# Patient Record
Sex: Male | Born: 1955 | Race: White | Hispanic: No | Marital: Single | Smoking: Former smoker
Health system: Southern US, Community
[De-identification: ages and names within clinical notes are randomized; demographics above are authoritative.]

## PROBLEM LIST (undated history)

## (undated) DIAGNOSIS — I739 Peripheral vascular disease, unspecified: Secondary | ICD-10-CM

## (undated) DIAGNOSIS — R0600 Dyspnea, unspecified: Secondary | ICD-10-CM

## (undated) DIAGNOSIS — Z972 Presence of dental prosthetic device (complete) (partial): Secondary | ICD-10-CM

## (undated) DIAGNOSIS — J449 Chronic obstructive pulmonary disease, unspecified: Secondary | ICD-10-CM

## (undated) DIAGNOSIS — I6529 Occlusion and stenosis of unspecified carotid artery: Secondary | ICD-10-CM

## (undated) DIAGNOSIS — E785 Hyperlipidemia, unspecified: Secondary | ICD-10-CM

## (undated) DIAGNOSIS — F17200 Nicotine dependence, unspecified, uncomplicated: Secondary | ICD-10-CM

## (undated) DIAGNOSIS — K08109 Complete loss of teeth, unspecified cause, unspecified class: Secondary | ICD-10-CM

## (undated) DIAGNOSIS — I209 Angina pectoris, unspecified: Secondary | ICD-10-CM

## (undated) DIAGNOSIS — N529 Male erectile dysfunction, unspecified: Secondary | ICD-10-CM

## (undated) DIAGNOSIS — K219 Gastro-esophageal reflux disease without esophagitis: Secondary | ICD-10-CM

## (undated) DIAGNOSIS — I10 Essential (primary) hypertension: Secondary | ICD-10-CM

## (undated) DIAGNOSIS — I2510 Atherosclerotic heart disease of native coronary artery without angina pectoris: Secondary | ICD-10-CM

## (undated) HISTORY — PX: ELBOW SURGERY: SHX618

## (undated) HISTORY — DX: Gastro-esophageal reflux disease without esophagitis: K21.9

## (undated) HISTORY — DX: Male erectile dysfunction, unspecified: N52.9

## (undated) HISTORY — DX: Occlusion and stenosis of unspecified carotid artery: I65.29

## (undated) HISTORY — DX: Essential (primary) hypertension: I10

## (undated) HISTORY — DX: Chronic obstructive pulmonary disease, unspecified: J44.9

## (undated) HISTORY — DX: Complete loss of teeth, unspecified cause, unspecified class: K08.109

## (undated) HISTORY — DX: Nicotine dependence, unspecified, uncomplicated: F17.200

## (undated) HISTORY — DX: Hyperlipidemia, unspecified: E78.5

## (undated) HISTORY — DX: Presence of dental prosthetic device (complete) (partial): Z97.2

---

## 1999-03-31 ENCOUNTER — Emergency Department (HOSPITAL_COMMUNITY): Admission: EM | Admit: 1999-03-31 | Discharge: 1999-03-31 | Payer: Self-pay | Admitting: Emergency Medicine

## 2002-09-20 ENCOUNTER — Encounter: Payer: Self-pay | Admitting: Family Medicine

## 2002-09-20 ENCOUNTER — Encounter: Admission: RE | Admit: 2002-09-20 | Discharge: 2002-09-20 | Payer: Self-pay | Admitting: Family Medicine

## 2005-10-28 HISTORY — PX: COLONOSCOPY: SHX174

## 2007-03-05 LAB — HM COLONOSCOPY: HM Colonoscopy: NORMAL

## 2007-11-19 ENCOUNTER — Encounter: Admission: RE | Admit: 2007-11-19 | Discharge: 2007-11-19 | Payer: Self-pay | Admitting: Family Medicine

## 2010-10-03 ENCOUNTER — Encounter
Admission: RE | Admit: 2010-10-03 | Discharge: 2010-10-03 | Payer: Self-pay | Source: Home / Self Care | Admitting: Cardiovascular Disease

## 2010-10-09 ENCOUNTER — Ambulatory Visit (HOSPITAL_COMMUNITY)
Admission: RE | Admit: 2010-10-09 | Discharge: 2010-10-09 | Payer: Self-pay | Source: Home / Self Care | Attending: Cardiovascular Disease | Admitting: Cardiovascular Disease

## 2010-10-24 ENCOUNTER — Ambulatory Visit: Payer: Self-pay | Admitting: Surgery

## 2010-10-28 HISTORY — PX: OTHER SURGICAL HISTORY: SHX169

## 2010-11-01 ENCOUNTER — Inpatient Hospital Stay (HOSPITAL_COMMUNITY)
Admission: RE | Admit: 2010-11-01 | Discharge: 2010-11-02 | Payer: Self-pay | Source: Home / Self Care | Attending: Surgery | Admitting: Surgery

## 2010-11-01 DIAGNOSIS — I771 Stricture of artery: Secondary | ICD-10-CM

## 2010-11-01 HISTORY — DX: Stricture of artery: I77.1

## 2010-11-02 LAB — CBC
HCT: 42.1 % (ref 39.0–52.0)
Hemoglobin: 14.4 g/dL (ref 13.0–17.0)
MCH: 31.6 pg (ref 26.0–34.0)
MCHC: 34.2 g/dL (ref 30.0–36.0)
MCV: 92.5 fL (ref 78.0–100.0)
Platelets: 188 10*3/uL (ref 150–400)
RBC: 4.55 MIL/uL (ref 4.22–5.81)
RDW: 13.6 % (ref 11.5–15.5)
WBC: 13.7 10*3/uL — ABNORMAL HIGH (ref 4.0–10.5)

## 2010-11-02 LAB — BASIC METABOLIC PANEL
BUN: 6 mg/dL (ref 6–23)
CO2: 23 mEq/L (ref 19–32)
Calcium: 8.6 mg/dL (ref 8.4–10.5)
Chloride: 100 mEq/L (ref 96–112)
Creatinine, Ser: 0.79 mg/dL (ref 0.4–1.5)
GFR calc Af Amer: >60 mL/min (ref >60)
GFR calc non Af Amer: >60 mL/min (ref >60)
Glucose, Bld: 147 mg/dL — ABNORMAL HIGH (ref 70–99)
Potassium: 3.6 mEq/L (ref 3.5–5.1)
Sodium: 131 mEq/L — ABNORMAL LOW (ref 135–145)

## 2010-11-12 ENCOUNTER — Ambulatory Visit
Admission: RE | Admit: 2010-11-12 | Discharge: 2010-11-12 | Payer: Self-pay | Source: Home / Self Care | Attending: Surgery | Admitting: Surgery

## 2011-01-07 LAB — COMPREHENSIVE METABOLIC PANEL
ALT: 25 U/L (ref 0–53)
AST: 22 U/L (ref 0–37)
Albumin: 3.9 g/dL (ref 3.5–5.2)
Alkaline Phosphatase: 74 U/L (ref 39–117)
BUN: 11 mg/dL (ref 6–23)
CO2: 29 mEq/L (ref 19–32)
Calcium: 10.2 mg/dL (ref 8.4–10.5)
Chloride: 103 mEq/L (ref 96–112)
Creatinine, Ser: 0.9 mg/dL (ref 0.4–1.5)
GFR calc Af Amer: >60 mL/min (ref >60)
GFR calc non Af Amer: >60 mL/min (ref >60)
Glucose, Bld: 105 mg/dL — ABNORMAL HIGH (ref 70–99)
Potassium: 4.7 mEq/L (ref 3.5–5.1)
Sodium: 139 mEq/L (ref 135–145)
Total Bilirubin: 0.5 mg/dL (ref 0.3–1.2)
Total Protein: 6.8 g/dL (ref 6.0–8.3)

## 2011-01-07 LAB — CBC
HCT: 46 % (ref 39.0–52.0)
Hemoglobin: 15.8 g/dL (ref 13.0–17.0)
MCH: 31.7 pg (ref 26.0–34.0)
MCHC: 34.3 g/dL (ref 30.0–36.0)
MCV: 92.4 fL (ref 78.0–100.0)
Platelets: 221 10*3/uL (ref 150–400)
RBC: 4.98 MIL/uL (ref 4.22–5.81)
RDW: 13.4 % (ref 11.5–15.5)
WBC: 9.8 10*3/uL (ref 4.0–10.5)

## 2011-01-07 LAB — URINALYSIS, ROUTINE W REFLEX MICROSCOPIC
Bilirubin Urine: NEGATIVE
Glucose, UA: NEGATIVE mg/dL
Hgb urine dipstick: NEGATIVE
Ketones, ur: NEGATIVE mg/dL
Nitrite: NEGATIVE
Protein, ur: NEGATIVE mg/dL
Specific Gravity, Urine: 1.009 (ref 1.005–1.030)
Urobilinogen, UA: 0.2 mg/dL (ref 0.0–1.0)
pH: 5 (ref 5.0–8.0)

## 2011-01-07 LAB — PROTIME-INR
INR: 0.94 (ref 0.00–1.49)
Prothrombin Time: 12.8 seconds (ref 11.6–15.2)

## 2011-01-07 LAB — TYPE AND SCREEN
ABO/RH(D): A POS
Antibody Screen: NEGATIVE

## 2011-01-07 LAB — ABO/RH: ABO/RH(D): A POS

## 2011-01-07 LAB — SURGICAL PCR SCREEN
MRSA, PCR: NEGATIVE
Staphylococcus aureus: NEGATIVE

## 2011-01-07 LAB — APTT: aPTT: 28 seconds (ref 24–37)

## 2011-01-08 LAB — POTASSIUM: Potassium: 4 mEq/L (ref 3.5–5.1)

## 2011-03-05 ENCOUNTER — Ambulatory Visit (INDEPENDENT_AMBULATORY_CARE_PROVIDER_SITE_OTHER): Payer: 59 | Admitting: Medical

## 2011-03-05 ENCOUNTER — Encounter: Payer: Self-pay | Admitting: Medical

## 2011-03-05 VITALS — BP 140/92 | HR 60 | Ht 69.0 in | Wt 149.0 lb

## 2011-03-05 DIAGNOSIS — H02849 Edema of unspecified eye, unspecified eyelid: Secondary | ICD-10-CM

## 2011-03-05 DIAGNOSIS — Z23 Encounter for immunization: Secondary | ICD-10-CM

## 2011-03-05 DIAGNOSIS — I779 Disorder of arteries and arterioles, unspecified: Secondary | ICD-10-CM

## 2011-03-05 DIAGNOSIS — H02829 Cysts of unspecified eye, unspecified eyelid: Secondary | ICD-10-CM

## 2011-03-05 DIAGNOSIS — Z0000 Encounter for general adult medical examination without abnormal findings: Secondary | ICD-10-CM

## 2011-03-05 DIAGNOSIS — F17200 Nicotine dependence, unspecified, uncomplicated: Secondary | ICD-10-CM

## 2011-03-05 DIAGNOSIS — E785 Hyperlipidemia, unspecified: Secondary | ICD-10-CM | POA: Insufficient documentation

## 2011-03-05 LAB — POCT URINALYSIS DIPSTICK
Bilirubin, UA: NEGATIVE
Blood, UA: NEGATIVE
Glucose, UA: NEGATIVE
Ketones, UA: NEGATIVE
Leukocytes, UA: NEGATIVE
Nitrite, UA: NEGATIVE
Protein, UA: NEGATIVE
Spec Grav, UA: 1.005
Urobilinogen, UA: NEGATIVE
pH, UA: 6

## 2011-03-05 NOTE — Progress Notes (Signed)
Subjective:    Patient ID: Manuel Richardson, male    DOB: 07-Jan-1956, 55 y.o.   MRN: [id number]  HPI Here as a new patient today for physical exam.  Was seeing Dr. 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   He notes that he had been doing well until late 2011 when he started having a lot of dizziness, ended up having carotid surgery on the left.  He also had stress testing and catheterization at that time, which he says was normal.   Overall been doing ok except for a few issues including ongoing left arm numbness and pain that began about the same time as his dizziness.  Has f/u soon with vascular to repeat ultrasound of neck and recheck on left arm numbness.       Review of Systems  Constitutional: Positive for activity change and fatigue. Negative for fever, chills, appetite change and unexpected weight change.  HENT: Negative for hearing loss, ear pain, congestion, sore throat, rhinorrhea, trouble swallowing, neck pain and voice change.   Eyes: Negative for discharge and visual disturbance.  Respiratory: Negative for cough, chest tightness, shortness of breath and wheezing.   Cardiovascular: Negative for chest pain, palpitations and leg swelling.  Gastrointestinal: Negative for nausea, vomiting, abdominal pain, diarrhea, constipation and blood in stool.  Genitourinary: Negative for dysuria, urgency, hematuria, decreased urine volume, discharge, penile pain and testicular pain.       +ED, difficulty getting erections, has used Cialis with some benefit since late 2011  Musculoskeletal: Positive for myalgias and arthralgias. Negative for back pain and joint swelling.       Left arm pain and numbness, worse laterally, began late fall 2011  Skin: Negative.  Negative for rash.       Lesion above right eye, knot on left leg above knee  Neurological: Positive for numbness. Negative for syncope, weakness and headaches.       Left lateral arm from shoulder down to fingers  Hematological: Does  not bruise/bleed easily.  Psychiatric/Behavioral: Negative for sleep disturbance and dysphoric mood.       Objective:   Physical Exam  Constitutional: He is oriented to person, place, and time. He appears well-developed and well-nourished. No distress.       Thin white male  HENT:  Head: Normocephalic.  Right Ear: Tympanic membrane and external ear normal.  Left Ear: Tympanic membrane and external ear normal.  Nose: Septal deviation present. No mucosal edema, rhinorrhea or sinus tenderness.  Mouth/Throat: Oropharynx is clear and moist and mucous membranes are normal. He has dentures. No oral lesions. Normal dentition. No dental caries. No oropharyngeal exudate.  Eyes: Conjunctivae and EOM are normal. Pupils are equal, round, and reactive to light. No scleral icterus.  Neck: Normal range of motion. Neck supple. No JVD present. No thyromegaly present.  Cardiovascular: Normal rate, regular rhythm, normal heart sounds and intact distal pulses.   No murmur heard. Pulmonary/Chest: Effort normal. He has no wheezes. He has no rales.       Decreased breath sounds in general  Abdominal: Soft. Bowel sounds are normal. He exhibits no distension and no mass. There is no tenderness. There is no rebound and no guarding.  Genitourinary: Penis normal. Guaiac negative stool. No penile tenderness.       Prostate somewhat enlarged, no nodules, anus with 2 small hemorrhoids  Musculoskeletal: Normal range of motion. He exhibits no edema and no tenderness.  Lymphadenopathy:    He has no cervical adenopathy.  Neurological:  He is alert and oriented to person, place, and time. He has normal reflexes. He displays normal reflexes. No cranial nerve deficit. He exhibits normal muscle tone. Coordination normal.       Sensation normal of UE and LE, strength normal UE and LE  Skin: Skin is warm and dry. No rash noted. No erythema.       Right medial upper eyelid with 3mm round papular lesion c/w sebaceous cyst.  Left  leg just proximal to knee laterally with 3cm round raised lesion with central opening c/w cyst.  No drainage.  Otherwise few scattered benign appearing macules.   Psychiatric: He has a normal mood and affect. His behavior is normal. Judgment and thought content normal.          Assessment & Plan:   Encounter Diagnoses  Name Primary?  . Annual physical exam Yes  . Sebaceous cyst of eyelid   . Hyperlipidemia   . Carotid artery disease   . Tobacco use disorder     1) discussed healthy lifestyle, diet, exercise, gave handout on recommendations. 2) Sebaceous cyst - Dr. Susann Givens used a curette to express approx .25 cc of cheesy material from right upper eyelid medial sebaceous cyst 3) Hyperlipidemia - c/t same meds, labs today.  Discussed low cholesterol diet. 4) carotid artery dz - we will obtain prior records from hospital visit and carotid surgery 10/2010 at [facility].    5) tobacco use disorder - we discussed his cardiac risk factors.   Strongly recommended he stop tobacco.  Gave 1-800-QUIT-NOW hotline.   6) records request for prior cardiac cath and workup at [facility] 12/11, and request copy of prior colonoscopy. 7) we will see him back to address his BP and left leg cyst.

## 2011-03-05 NOTE — Patient Instructions (Addendum)
Updated your vaccines today - tetanus and pneumonia.  I recommend a yearly flu shot.  Blood pressure elevated today.  I want to recheck this in 2-3 weeks.   Tobacco use - I strongly recommend you stop smoking.  This puts you at risk for heart disease and stroke, as well as other medical problems.  Call 1-800-QUIT-NOW for counseling on stopping smoking.  We will try and get a copy of your stress test, hospital visit in January, as well as a copy of your colonoscopy.   I recommend you exercise daily, eat healthy low fat diet.  Less red meat, fast food, more fruits and vegetables.  Drink plenty of water daily.    Return in 2 weeks.

## 2011-03-06 ENCOUNTER — Encounter: Payer: Self-pay | Admitting: Medical

## 2011-03-06 ENCOUNTER — Other Ambulatory Visit: Payer: Self-pay | Admitting: Medical

## 2011-03-06 ENCOUNTER — Telehealth: Payer: Self-pay | Admitting: *Deleted

## 2011-03-06 LAB — CBC WITH DIFFERENTIAL/PLATELET
Basophils Absolute: 0.1 10*3/uL (ref 0.0–0.1)
Basophils Relative: 1 % (ref 0–1)
Eosinophils Absolute: 0.2 10*3/uL (ref 0.0–0.7)
Eosinophils Relative: 2 % (ref 0–5)
HCT: 46.2 % (ref 39.0–52.0)
Hemoglobin: 15.9 g/dL (ref 13.0–17.0)
Lymphocytes Relative: 31 % (ref 12–46)
Lymphs Abs: 2.2 10*3/uL (ref 0.7–4.0)
MCH: 32.3 pg (ref 26.0–34.0)
MCHC: 34.4 g/dL (ref 30.0–36.0)
MCV: 93.7 fL (ref 78.0–100.0)
Monocytes Absolute: 0.5 10*3/uL (ref 0.1–1.0)
Monocytes Relative: 7 % (ref 3–12)
Neutro Abs: 4.2 10*3/uL (ref 1.7–7.7)
Neutrophils Relative %: 59 % (ref 43–77)
Platelets: 233 10*3/uL (ref 150–400)
RBC: 4.93 MIL/uL (ref 4.22–5.81)
RDW: 14 % (ref 11.5–15.5)
WBC: 7.1 10*3/uL (ref 4.0–10.5)

## 2011-03-06 LAB — COMPREHENSIVE METABOLIC PANEL
ALT: 23 U/L (ref 0–53)
AST: 22 U/L (ref 0–37)
Albumin: 4.8 g/dL (ref 3.5–5.2)
Alkaline Phosphatase: 78 U/L (ref 39–117)
BUN: 11 mg/dL (ref 6–23)
CO2: 22 mEq/L (ref 19–32)
Calcium: 9.9 mg/dL (ref 8.4–10.5)
Chloride: 104 mEq/L (ref 96–112)
Creat: 0.82 mg/dL (ref 0.40–1.50)
Glucose, Bld: 100 mg/dL — ABNORMAL HIGH (ref 70–99)
Potassium: 4.3 mEq/L (ref 3.5–5.3)
Sodium: 139 mEq/L (ref 135–145)
Total Bilirubin: 0.5 mg/dL (ref 0.3–1.2)
Total Protein: 7.6 g/dL (ref 6.0–8.3)

## 2011-03-06 LAB — TSH: TSH: 2.148 u[IU]/mL (ref 0.350–4.500)

## 2011-03-06 LAB — LIPID PANEL
Cholesterol: 212 mg/dL — ABNORMAL HIGH (ref 0–200)
HDL: 50 mg/dL (ref >39)
LDL Cholesterol: 135 mg/dL — ABNORMAL HIGH (ref 0–99)
Total CHOL/HDL Ratio: 4.2 Ratio
Triglycerides: 133 mg/dL (ref <150)
VLDL: 27 mg/dL (ref 0–40)

## 2011-03-06 LAB — PSA: PSA: 1.66 ng/mL (ref <=4.00)

## 2011-03-06 MED ORDER — ROSUVASTATIN CALCIUM 5 MG PO TABS: 5.0000 mg | ORAL_TABLET | Freq: Every day | ORAL | Status: DC

## 2011-03-06 NOTE — Telephone Encounter (Signed)
Message copied by Ellsworth Lennox on Wed Mar 06, 2011  3:09 PM ------      Message from: Aleen Campi, DAVID      Created: Wed Mar 06, 2011  2:54 PM       His cholesterol came back a little high.  I'd recommend he start taking Crestor 5mg  daily (assuming he has actually been taking Crestor 5mg  3 x/wk).  Please verify.  His glucose was slightly elevated.  Otherwise his blood counts, liver, kidney, lytes, thyroid, prostate labs are all ok.  Next visit we can check a HgbA1C diabetes screen since glucose was slightly elevated.  I will send script for Crestor 5mg  daily QHS.  Lets see him back in 2 wk to discuss his fatigue further since his labs show no anemia or thyroid problem.  I suspect this is related to his COPD and smoking.

## 2011-03-12 NOTE — Assessment & Plan Note (Signed)
OFFICE VISIT   Manuel Richardson, Manuel Richardson  DOB:  Aug 17, 1956                                       10/24/2010  ZOXWR#:[id number]   REASON FOR VISIT:  Subclavian stenosis.   HISTORY:  This is a 54-year gentleman I am seeing at the request of Dr.  Allyson Sabal for evaluation of subclavian stenosis.  Patient has been having  symptoms for the past 6 months, which consists of left arm fatigue with  activity that has become lifestyle-limiting.  He has had several dizzy  spells; however, these were not related to any kind of arm activity,  either at sitting or standing.  Patient has recently been diagnosed with  hypercholesterolemia, which is now medically managed.  He does suffer  from chronic tobacco abuse from which he is trying to abstain from  currently.   REVIEW OF SYSTEMS:  GI:  Positive for hiatal hernia.  NEURO:  Positive for dizziness.  All other review systems are negative.   PAST MEDICAL HISTORY:  1. Hypercholesterolemia.  2. Left subclavian stenosis.   PAST SURGICAL HISTORY:  Left elbow surgery in 1972.   SOCIAL HISTORY:  He is married with 1 child.  Smokes 2 packs a day.  Drinks a 12-pack a week.   FAMILY HISTORY:  Positive for heart attack in his mother.   MEDICATIONS:  Zocor and aspirin.   ALLERGIES:  None.   PHYSICAL EXAMINATION:  Heart rate 81, blood pressure 137/84 in the  right, 111/80 on the left, temperature 97.9.  Generally, he is well-  appearing in no distress.  HEENT within normal limits.  Lungs are clear  bilaterally.  Cardiovascular:  Regular rate and rhythm.  No murmur.  Abdomen:  Soft and nontender.  Musculoskeletal without major deformity.  Neuro:  No focal deficits.  There is no left radial pulse.  Skin is  without rash.   DIAGNOSTICS:  The patient had an arteriogram which shows a large plaque  burden in the proximity of the vertebral artery takeoff in the left  subclavian.   ASSESSMENT:  Symptomatic left subclavian stenosis.   PLAN:  I discussed proceeding with an operation to improve his left arm  symptoms with activity.  I think he is a better candidate for a left  carotid subclavian bypass, given the proximity of the disease to his  left temporal artery.  I discussed the risks of stroke, the risk of  phrenic nerve injury, the risk of lymphatic leak to thoracic duct  injury.  All of the patient's questions were answered.  I have scheduled  his operation for Thursday, January 5.     Jorge Ny, MD  Electronically Signed   VWB/MEDQ  D:  10/24/2010  T:  10/24/2010  Job:  [id number]   cc:   Nanetta Batty, M.D.

## 2011-03-12 NOTE — Assessment & Plan Note (Signed)
OFFICE VISIT   MATTIS, FEATHERLY  DOB:  13-Nov-1955                                       11/12/2010  UEAVW#:[id number]   The patient comes back today for followup.  He underwent a left common  carotid to subclavian artery bypass graft for a symptomatic left  subclavian steal syndrome.  His postoperative course was uncomplicated  and he comes in today for followup.  He has been doing very well at  home.  He is no longer taking pain medicine.  His incision has a healing  ridge.  There is no evidence of infection.  The wound is intact.  He has  a palpable radial pulse.  He does have anisocoria.   The patient is doing very well at this time.  I have given him clearance  to return to driving.  He will be followed with ultrasound at Dr.  Hazle Coca office.  I will see him back in 6 months for followup.  We also  discussed the importance of smoking cessation.  He is using a nicotine  patch now and has cut back from 3 packs a day to 1 pack every 3 days.  We spent about 4 minutes talking about smoking cessation.     Jorge Ny, MD  Electronically Signed   VWB/MEDQ  D:  11/12/2010  T:  11/13/2010  Job:  [id number]   cc:   Nanetta Batty, M.D.

## 2011-03-12 NOTE — Procedures (Signed)
Manuel, Richardson NO.:  [id number]   MEDICAL RECORD NO.:  [id number]          PATIENT TYPE:  AMB   LOCATION:  SDS                          FACILITY:  [facility]   PHYSICIAN:  Nanetta Batty, M.D.   DATE OF BIRTH:  24-Jul-1956   DATE OF PROCEDURE:  DATE OF DISCHARGE:  10/09/2010                    PERIPHERAL VASCULAR INVASIVE PROCEDURE    PROCEDURES PERFORMED:  Arch aortogram, selective right innominate and  left subclavian artery angiogram.   Manuel Richardson is a 55 year old thin-appearing married Caucasian male,  father of one, grandfather of one grandchild who was referred through  the courtesy of Kim __________ Cordelia Poche at [facility] for  cardiovascular evaluation because of auscultated bruit, an abnormal  carotid Doppler study.  He works doing Hospital doctor.  His cardiac risk factor  profile is positive for 120-pack-year history of tobacco abuse, smokes  three packs per day.  He does have family history of heart disease in  the mother who died of an MI at age 61.  He has never had a heart attack  or stroke.  He does get occasional chest pain.  He does complain of  occasional episodes of dizziness as well as left upper extremity  discomfort.  Carotid Dopplers performed in our office several weeks ago  revealed an occluded right internal carotid artery, moderate left ICA  stenosis, and high-grade left subclavian artery stenosis with to-and-fro  left vertebral filling.  He had a Myoview stress test performed on  September 24, 2010, in our office that showed no ischemia.  He presents  now for angiography, potential intervention for symptomatic left  subclavian artery stenosis.   PROCEDURE DESCRIPTION:  The patient was brought to the second floor  [facility] PV angiographic suite in the post absorptive state.  He was  premedicated with p.o. Valium, IV Versed, and fentanyl.  His right groin  was prepped and shaved in usual sterile fashion.  Xylocaine 1% was used  for  local anesthesia.  A 5-French sheath was inserted into the right  femoral artery using standard Seldinger technique.  A 5-French pigtail  catheter and right Judkins catheter was used for arch angiography,  selective right innominate and left subclavian artery angiography.  Visipaque dye was used for the entirety of the case.  Retrograde aortic  pressures were monitored during the case.   ANGIOGRAPHIC RESULTS:  1. Aortic arch; type 2 arch.  2. Right innominate; widely patent with no evidence of retrograde left      vertebral filling on delayed imaging.  3. Left subclavian; 80% eccentric left subclavian artery stenosis just      proximal to the takeoff of the left __________.  There was TIMI 3      flow with antegrade injection of the left subclavian artery.   IMPRESSION:  Manuel Richardson has symptomatic left subclavian artery stenosis.  His left subclavian is at least a 10-mm vessel with a large plaque  burden in close proximity to the takeoff of the vertebral artery.  I am  worried that there may be some plaque shift and/or dissection involving  the vertebral with stenting.  We  would have to use at least an 8-mm  stent post dilating with a 9-mm balloon.  I reviewed the angiograms with  Dr. Durene Cal, who feels the patient is an acceptable candidate for  left carotid subclavian bypass grafting.   Sheath was removed and pressure was held in the groin to achieve  hemostasis.  The patient left the lab in stable condition.  He will be  hydrated, discharged home later today as an outpatient.  We will see him  back in the office in 1 week for followup.      Nanetta Batty, M.D.      JB/MEDQ  D:  10/09/2010  T:  10/10/2010  Job:  [id number]   cc:   [facility]  Second Floor PV Angiographic Suite  [facility] Kim __________, PA-C   Electronically Signed by Nanetta Batty M.D. on 10/14/2010 03:03:17 PM

## 2011-03-20 ENCOUNTER — Encounter: Payer: Self-pay | Admitting: Medical

## 2011-03-20 ENCOUNTER — Ambulatory Visit (INDEPENDENT_AMBULATORY_CARE_PROVIDER_SITE_OTHER): Payer: 59 | Admitting: Medical

## 2011-03-20 VITALS — BP 130/80 | HR 80 | Temp 98.0°F | Ht 69.0 in | Wt 143.0 lb

## 2011-03-20 DIAGNOSIS — R5383 Other fatigue: Secondary | ICD-10-CM

## 2011-03-20 DIAGNOSIS — R7301 Impaired fasting glucose: Secondary | ICD-10-CM

## 2011-03-20 DIAGNOSIS — R030 Elevated blood-pressure reading, without diagnosis of hypertension: Secondary | ICD-10-CM

## 2011-03-20 DIAGNOSIS — J449 Chronic obstructive pulmonary disease, unspecified: Secondary | ICD-10-CM

## 2011-03-20 DIAGNOSIS — E785 Hyperlipidemia, unspecified: Secondary | ICD-10-CM

## 2011-03-20 LAB — POCT GLYCOSYLATED HEMOGLOBIN (HGB A1C): Hemoglobin A1C: 5.8

## 2011-03-20 NOTE — Progress Notes (Signed)
Subjective:   HPI  Manuel Richardson is a 55 y.o. male who presents for followup. I saw him for initial visit on 03/05/2011 for a full physical. He is here today to discuss lab results, and his main concern is fatigue. Other issues that were brought up at the physical included elevated blood pressure, cholesterol, tobacco use. His glucose came back slightly elevated, his cholesterol LDL was elevated. Thus we will get a hemoglobin A1c screening today, and he has begun Crestor daily instead of 3 times a week as he was doing. Since last visit he has tried to start weaning down his tobacco use. He has bought an electronic cigarette and has started weaning off regular cigarettes. Since his last visit here, he has also seen the vascular surgeon in followup from his carotid endarterectomy in January of this year, and things are stable, no further intervention is planned at this time.  The following portions of the patient's history were reviewed and updated as appropriate: allergies, current medications, past family history, past medical history, past social history, past surgical history and problem list.  Past Medical History  Diagnosis Date  . Hyperlipidemia   . COPD (chronic obstructive pulmonary disease)   . Full dentures   . Erectile dysfunction   . GERD (gastroesophageal reflux disease)   . Tobacco use disorder     Review of Systems Constitutional: Positive for fatigue. denies fever, chills, sweats, unexpected weight change, anorexia Dermatology: denies rash ENT: no runny nose, ear pain, sore throat, hoarseness, sinus pain Cardiology: denies chest pain, palpitations, edema Respiratory: Occasional shortness of breath. denies cough, wheezing Gastroenterology: denies abdominal pain, nausea, vomiting, diarrhea, constipation,  Hematology: denies bleeding or bruising problems Musculoskeletal: Positive for left arm pain times months. denies joint swelling, back pain Ophthalmology: denies vision  changes Urology: denies dysuria, difficulty urinating, hematuria, urinary frequency, urgency     Objective:   Physical Exam  General appearence: alert, no distress, WD/WN, thin white male  Oral cavity: MMM, no lesions Neck: supple, no lymphadenopathy, no thyromegaly, no masses, no JVD Heart: RRR, normal S1, S2, no murmurs Lungs: Decreased breath sounds in general, no wheezes, rhonchi, or rales Extremities: no edema, no cyanosis, no clubbing Pulses: 2+ symmetric, upper and lower extremities, normal cap refill  Assessment :    Encounter Diagnoses  Name Primary?  Marland Kitchen COPD (chronic obstructive pulmonary disease) Yes  . Impaired fasting glucose   . Fatigue   . Hyperlipidemia   . Elevated blood pressure reading without diagnosis of hypertension       Plan:    Review prior records. Chest x-ray 10/03/2010 with COPD but no other acute findings or mass. Echocardiogram 11/19/2007 with mild aortic sclerosis, trace mitral regurgitation, normal left ventricular size, ejection fraction 60-65%. Colonoscopy 12/09/2006 with hyperplastic polyps, no adenomatous change or malignancy identified. Notation to repeat in 10 years.  COPD-advise that his fatigue seems to be more related to his lung function versus any other cause given a relatively recent cardiac workup per his report, relatively normal labs, and this seems to be the more likely cause. Discussed the need to stop smoking. We will set up spirometry before and after nebs.  Impaired fasting glucose-hemoglobin A1c today of 5.8%. Discussed healthy diet, exercise.  Fatigue-same plan as COPD.    Hyperlipidemia-continue Crestor 5 mg each bedtime, recheck lipids in 3 months  Elevated blood pressure-we discussed risk and benefits of medication, criteria for diagnosis of hypertension, and at this time he will work on efforts to reduce  salt in his diet, to work on efforts to stop smoking, and we will recheck blood pressure in 3 months.

## 2011-03-21 ENCOUNTER — Ambulatory Visit (HOSPITAL_COMMUNITY)
Admission: RE | Admit: 2011-03-21 | Discharge: 2011-03-21 | Disposition: A | Discharge status: Home / Self Care | Payer: 59 | Source: Ambulatory Visit | Attending: Family Medicine | Admitting: Family Medicine

## 2011-03-21 DIAGNOSIS — J449 Chronic obstructive pulmonary disease, unspecified: Secondary | ICD-10-CM | POA: Insufficient documentation

## 2011-03-21 DIAGNOSIS — J4489 Other specified chronic obstructive pulmonary disease: Secondary | ICD-10-CM | POA: Insufficient documentation

## 2011-03-28 ENCOUNTER — Other Ambulatory Visit: Payer: Self-pay | Admitting: Medical

## 2011-03-28 MED ORDER — BECLOMETHASONE DIPROPIONATE 80 MCG/ACT IN AERS: 1.0000 | INHALATION_SPRAY | Freq: Two times a day (BID) | RESPIRATORY_TRACT | Status: DC

## 2011-03-29 ENCOUNTER — Telehealth: Payer: Self-pay | Admitting: *Deleted

## 2011-03-29 NOTE — Telephone Encounter (Addendum)
Message copied by Dorthula Perfect on Fri Mar 29, 2011  8:36 AM ------      Message from: Aleen Campi, DAVID S      Created: Thu Mar 28, 2011  1:24 PM       His spirometry results shows decreased lung volumes and airway defect.  I do feel as though his lung function is the main reason for his fatigue.   I want to try him on a twice daily inhaler to see if this helps.  I'll send script.  Also, if he is interested, lets set up referral to pulmonary rehab to help with COPD and increased exercise tolerance.  Let me know if any questions.     Pt was notified of spirometry results.  Pt will try inhaler but will wait on referral at this time.  Will call back to let me know when he is ready for referral to pulmonary rehab.  CM, LPN

## 2011-05-13 ENCOUNTER — Ambulatory Visit (INDEPENDENT_AMBULATORY_CARE_PROVIDER_SITE_OTHER): Payer: 59 | Admitting: Surgery

## 2011-05-13 DIAGNOSIS — I771 Stricture of artery: Secondary | ICD-10-CM

## 2011-05-14 NOTE — Assessment & Plan Note (Signed)
OFFICE VISIT  Manuel Richardson, Manuel Richardson DOB:  [birth year]                                       05/13/2011 ZOXWR#:[id number]  REASON FOR VISIT:  Followup of left carotid subclavian bypass.  HISTORY:  This is a 55 year old gentleman who underwent left carotid subclavian bypass graft with 7 mm Dacron graft on 11/01/2010.  This was done for left arm claudication as well as some dizziness, both of which have resolved.  His biggest complaint is shortness of breath when he wakes up in the morning.  He is a former heavy smoker having smoked 3 packs a day.  He has cut back temporarily but he still continues to smoke.  Overall he does not have any left arm symptoms and minimal dizzy spells.  He remains very active.  PHYSICAL EXAMINATION:  Vital signs:  Heart rate 68, blood pressure 128/75, O2 sat 97%.  General:  He is well-appearing, in no distress. HEENT:  The left carotid subclavian incision is well-healed. Cardiovascular:  Regular rate and rhythm.  He has a palpable left radial pulse.  Neurological:  He is intact.  I reviewed the ultrasound from Dr. Hazle Coca office which shows widely patent bypass graft.  There are mildly elevated velocities within the internal carotid artery on the left in the 50%-69% range.  ASSESSMENT:  Status post left carotid subclavian bypass graft.  PLAN:  The patient is doing very well at this time with regards to his operation.  I have again encouraged him to cut down or stop his smoking. The patient will continue to be followed by Dr. Hazle Coca office with surveillance duplex ultrasounds from his bypass graft.  I will not schedule the patient to come back to see me, however, unless he has a problem.    Jorge Ny, MD Electronically Signed  VWB/MEDQ  D:  05/13/2011  T:  05/14/2011  Job:  [id number]  cc:   Nanetta Batty, M.D.

## 2012-03-30 ENCOUNTER — Ambulatory Visit (INDEPENDENT_AMBULATORY_CARE_PROVIDER_SITE_OTHER): Payer: 59 | Admitting: Medical

## 2012-03-30 ENCOUNTER — Encounter: Payer: Self-pay | Admitting: Medical

## 2012-03-30 VITALS — BP 150/82 | HR 80 | Temp 97.9°F | Resp 16 | Ht 70.0 in | Wt 141.0 lb

## 2012-03-30 DIAGNOSIS — Z0000 Encounter for general adult medical examination without abnormal findings: Secondary | ICD-10-CM

## 2012-03-30 DIAGNOSIS — I10 Essential (primary) hypertension: Secondary | ICD-10-CM

## 2012-03-30 DIAGNOSIS — R7301 Impaired fasting glucose: Secondary | ICD-10-CM

## 2012-03-30 DIAGNOSIS — Z720 Tobacco use: Secondary | ICD-10-CM

## 2012-03-30 DIAGNOSIS — N529 Male erectile dysfunction, unspecified: Secondary | ICD-10-CM

## 2012-03-30 DIAGNOSIS — Z125 Encounter for screening for malignant neoplasm of prostate: Secondary | ICD-10-CM

## 2012-03-30 DIAGNOSIS — E785 Hyperlipidemia, unspecified: Secondary | ICD-10-CM

## 2012-03-30 DIAGNOSIS — R6882 Decreased libido: Secondary | ICD-10-CM

## 2012-03-30 DIAGNOSIS — F17200 Nicotine dependence, unspecified, uncomplicated: Secondary | ICD-10-CM

## 2012-03-30 DIAGNOSIS — R5383 Other fatigue: Secondary | ICD-10-CM

## 2012-03-30 DIAGNOSIS — J449 Chronic obstructive pulmonary disease, unspecified: Secondary | ICD-10-CM

## 2012-03-30 LAB — POCT URINALYSIS DIPSTICK
Bilirubin, UA: NEGATIVE
Blood, UA: NEGATIVE
Glucose, UA: NEGATIVE
Ketones, UA: NEGATIVE
Leukocytes, UA: NEGATIVE
Nitrite, UA: NEGATIVE
Protein, UA: NEGATIVE
Spec Grav, UA: 1.015
Urobilinogen, UA: NEGATIVE
pH, UA: 5

## 2012-03-30 MED ORDER — LISINOPRIL 10 MG PO TABS: 10.0000 mg | ORAL_TABLET | Freq: Every day | ORAL | Status: DC

## 2012-03-30 NOTE — Patient Instructions (Signed)
Preventative Care for Adults, Male       REGULAR HEALTH EXAMS:  A routine yearly physical is a good way to check in with your primary care provider about your health and preventive screening. It is also an opportunity to share updates about your health and any concerns you have, and receive a thorough all-over exam.   Most health insurance companies pay for at least some preventative services.  Check with your health plan for specific coverages.  WHAT PREVENTATIVE SERVICES DO MEN NEED?  Adult men should have their weight and blood pressure checked regularly.   Men age 35 and older should have their cholesterol levels checked regularly.  Beginning at age 50 and continuing to age 75, men should be screened for colorectal cancer.  Certain people should may need continued testing until age 85.  Other cancer screening may include exams for testicular and prostate cancer.  Updating vaccinations is part of preventative care.  Vaccinations help protect against diseases such as the flu.  Lab tests are generally done as part of preventative care to screen for anemia and blood disorders, to screen for problems with the kidneys and liver, to screen for bladder problems, to check blood sugar, and to check your cholesterol level.  Preventative services generally include counseling about diet, exercise, avoiding tobacco, drugs, excessive alcohol consumption, and sexually transmitted infections.    GENERAL RECOMMENDATIONS FOR GOOD HEALTH:  Healthy diet:  Eat a variety of foods, including fruit, vegetables, animal or vegetable protein, such as meat, fish, chicken, and eggs, or beans, lentils, tofu, and grains, such as rice.  Drink plenty of water daily.  Decrease saturated fat in the diet, avoid lots of red meat, processed foods, sweets, fast foods, and fried foods.  Exercise:  Aerobic exercise helps maintain good heart health. At least 30-40 minutes of moderate-intensity exercise is recommended.  For example, a brisk walk that increases your heart rate and breathing. This should be done on most days of the week.   Find a type of exercise or a variety of exercises that you enjoy so that it becomes a part of your daily life.  Examples are running, walking, swimming, water aerobics, and biking.  For motivation and support, explore group exercise such as aerobic class, spin class, Zumba, Yoga,or  martial arts, etc.    Set exercise goals for yourself, such as a certain weight goal, walk or run in a race such as a 5k walk/run.  Speak to your primary care provider about exercise goals.  Disease prevention:  If you smoke or chew tobacco, find out from your caregiver how to quit. It can literally save your life, no matter how long you have been a tobacco user. If you do not use tobacco, never begin.   Maintain a healthy diet and normal weight. Increased weight leads to problems with blood pressure and diabetes.   The Body Mass Index or BMI is a way of measuring how much of your body is fat. Having a BMI above 27 increases the risk of heart disease, diabetes, hypertension, stroke and other problems related to obesity. Your caregiver can help determine your BMI and based on it develop an exercise and dietary program to help you achieve or maintain this important measurement at a healthful level.  High blood pressure causes heart and blood vessel problems.  Persistent high blood pressure should be treated with medicine if weight loss and exercise do not work.   Fat and cholesterol leaves deposits in your arteries   that can block them. This causes heart disease and vessel disease elsewhere in your body.  If your cholesterol is found to be high, or if you have heart disease or certain other medical conditions, then you may need to have your cholesterol monitored frequently and be treated with medication.   Ask if you should have a stress test if your history suggests this. A stress test is a test done on  a treadmill that looks for heart disease. This test can find disease prior to there being a problem.  Avoid drinking alcohol in excess (more than two drinks per day).  Avoid use of street drugs. Do not share needles with anyone. Ask for professional help if you need assistance or instructions on stopping the use of alcohol, cigarettes, and/or drugs.  Brush your teeth twice a day with fluoride toothpaste, and floss once a day. Good oral hygiene prevents tooth decay and gum disease. The problems can be painful, unattractive, and can cause other health problems. Visit your dentist for a routine oral and dental check up and preventive care every 6-12 months.   Look at your skin regularly.  Use a mirror to look at your back. Notify your caregivers of changes in moles, especially if there are changes in shapes, colors, a size larger than a pencil eraser, an irregular border, or development of new moles.  Safety:  Use seatbelts 100% of the time, whether driving or as a passenger.  Use safety devices such as hearing protection if you work in environments with loud noise or significant background noise.  Use safety glasses when doing any work that could send debris in to the eyes.  Use a helmet if you ride a bike or motorcycle.  Use appropriate safety gear for contact sports.  Talk to your caregiver about gun safety.  Use sunscreen with a SPF (or skin protection factor) of 15 or greater.  Lighter skinned people are at a greater risk of skin cancer. Don't forget to also wear sunglasses in order to protect your eyes from too much damaging sunlight. Damaging sunlight can accelerate cataract formation.   Practice safe sex. Use condoms. Condoms are used for birth control and to help reduce the spread of sexually transmitted infections (or STIs).  Some of the STIs are gonorrhea (the clap), chlamydia, syphilis, trichomonas, herpes, HPV (human papilloma virus) and HIV (human immunodeficiency virus) which causes AIDS.  The herpes, HIV and HPV are viral illnesses that have no cure. These can result in disability, cancer and death.   Keep carbon monoxide and smoke detectors in your home functioning at all times. Change the batteries every 6 months or use a model that plugs into the wall.   Vaccinations:  Stay up to date with your tetanus shots and other required immunizations. You should have a booster for tetanus every 10 years. Be sure to get your flu shot every year, since 5%-20% of the [place] population comes down with the flu. The flu vaccine changes each year, so being vaccinated once is not enough. Get your shot in the fall, before the flu season peaks.   Other vaccines to consider:  Pneumococcal vaccine to protect against certain types of pneumonia.  This is normally recommended for adults age 65 or older.  However, adults younger than 56 years old with certain underlying conditions such as diabetes, heart or lung disease should also receive the vaccine.  Shingles vaccine to protect against Varicella Zoster if you are older than age 60, or younger   than 56 years old with certain underlying illness.  Hepatitis A vaccine to protect against a form of infection of the liver by a virus acquired from food.  Hepatitis B vaccine to protect against a form of infection of the liver by a virus acquired from blood or body fluids, particularly if you work in health care.  If you plan to travel internationally, check with your local health department for specific vaccination recommendations.  Cancer Screening:  Most routine colon cancer screening begins at the age of 50. On a yearly basis, doctors may provide special easy to use take-home tests to check for hidden blood in the stool. Sigmoidoscopy or colonoscopy can detect the earliest forms of colon cancer and is life saving. These tests use a small camera at the end of a tube to directly examine the colon. Speak to your caregiver about this at age 50, when routine  screening begins (and is repeated every 5 years unless early forms of pre-cancerous polyps or small growths are found).   At the age of 50 men usually start screening for prostate cancer every year. Screening may begin at a younger age for those with higher risk. Those at higher risk include African-Americans or having a family history of prostate cancer. There are two types of tests for prostate cancer:   Prostate-specific antigen (PSA) testing. Recent studies raise questions about prostate cancer using PSA and you should discuss this with your caregiver.   Digital rectal exam (in which your doctor's lubricated and gloved finger feels for enlargement of the prostate through the anus).   Screening for testicular cancer.  Do a monthly exam of your testicles. Gently roll each testicle between your thumb and fingers, feeling for any abnormal lumps. The best time to do this is after a hot shower or bath when the tissues are looser. Notify your caregivers of any lumps, tenderness or changes in size or shape immediately.     

## 2012-03-30 NOTE — Progress Notes (Signed)
Subjective:   HPI  Manuel Richardson is a 56 y.o. male who presents for a complete physical.  Last visit here a year ago.  Main concern today is ED, low energy, low libido.  He wants his testosterone checked.  He uses Cialis and this works fine, but wonders about his hormone levels.  otherwise been in usual state of health.  No other c/o.    He is not compliant with medication for hypertension and hyperlipidemia.  He continues to smoke 3ppd x 30+ years , and drinks heavy on the weekends, although he does note report a specific quantity.  Likely>12 drinks weekly per his admission.  Reviewed their medical, surgical, family, social, medication, and allergy history and updated chart as appropriate.   Past Medical History  Diagnosis Date  . Hyperlipidemia   . COPD (chronic obstructive pulmonary disease)   . Full dentures   . Erectile dysfunction   . GERD (gastroesophageal reflux disease)   . Tobacco use disorder     Past Surgical History  Procedure Date  . Carotid artery surgery 2012    stent in left side  . Elbow surgery teenager    left  . Colonoscopy 2007    Family History  Problem Relation Age of Onset  . Heart disease Mother     died 25 with MI  . Cancer Father     lung cancer  . Hypertension Brother   . Diabetes Neg Hx   . Stroke Neg Hx     History   Social History  . Marital Status: Married    Spouse Name: N/A    Number of Children: N/A  . Years of Education: N/A   Occupational History  . 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 Smoking status: Current Everyday Smoker -- 3.0 packs/day for 38 years  . Smokeless tobacco: Not on file  . Alcohol Use: 7.2 oz/week    12 Cans of beer per week  . Drug Use: No  . Sexually Active:      no exercise.   Other Topics Concern  . Not on file   Social History Narrative   Married, 1 daughter in [place], exercise - walking on job    Current Outpatient Prescriptions on File Prior to Visit    Medication Sig Dispense Refill  . beclomethasone (QVAR) 80 MCG/ACT inhaler Inhale 1 puff into the lungs 2 (two) times daily. 1 inhalation BID  1 Inhaler  3  . lisinopril (PRINIVIL,ZESTRIL) 10 MG tablet Take 1 tablet (10 mg total) by mouth daily.  30 tablet  3  . rosuvastatin (CRESTOR) 5 MG tablet Take 1 tablet (5 mg total) by mouth daily.  30 tablet  11  . tadalafil (CIALIS) 20 MG tablet Take 20 mg by mouth daily as needed.          No Known Allergies  Review of Systems Constitutional: -fever, -chills, -sweats, -unexpected weight change, -anorexia, +fatigue Allergy: -sneezing, -itching, -congestion Dermatology: denies changing moles, rash, lumps, new worrisome lesions ENT: -runny nose, -ear pain, -sore throat, -hoarseness, -sinus pain, -teeth pain, -tinnitus, -hearing loss, -epistaxis Cardiology:  -chest pain, -palpitations, -edema, -orthopnea, -paroxysmal nocturnal dyspnea Respiratory: -cough, -shortness of breath, -dyspnea on exertion, -wheezing, -hemoptysis Gastroenterology: -abdominal pain, -nausea, -vomiting, -diarrhea, -constipation, -blood in stool, -changes in bowel movement, -dysphagia Hematology: -bleeding or bruising problems Musculoskeletal: -arthralgias, -myalgias, -joint swelling, -back pain, -neck pain, -cramping, -gait changes Ophthalmology: -vision changes, -eye redness, -itching, -  discharge Urology: -dysuria, -difficulty urinating, -hematuria, -urinary frequency, -urgency, incontinence Neurology: -headache, -weakness, -tingling, -numbness, -speech abnormality, -memory loss, -falls, +dizziness Psychology:  -depressed mood, -agitation, +sleep problems    Objective:   Physical Exam  Filed Vitals:   03/30/12 0856  BP: 150/82  Pulse: 80  Temp: 97.9 F (36.6 C)  Resp: 16    General appearance: alert, no distress, WD/WN, lean white male Skin: few scattered benign appearing macules, no worrisome lesions HEENT: normocephalic, conjunctiva/corneas normal, sclerae  anicteric, PERRLA, EOMi, nares patent, no discharge or erythema, pharynx normal Oral cavity: MMM, tongue normal, upper and lower dentures Neck: supple, no lymphadenopathy, no thyromegaly, no masses, normal ROM, no bruits, left sided surgical scar Chest: non tender, normal shape and expansion Heart: RRR, normal S1, S2, no murmurs Lungs: CTA bilaterally, no wheezes, rhonchi, or rales Abdomen: +bs, soft, non tender, non distended, no masses, no hepatomegaly, no splenomegaly, no bruits Back: non tender, normal ROM, no scoliosis Musculoskeletal: upper extremities non tender, no obvious deformity, normal ROM throughout, lower extremities non tender, no obvious deformity, normal ROM throughout Extremities: no edema, no cyanosis, no clubbing Pulses: 2+ symmetric, upper and lower extremities, normal cap refill Neurological: alert, oriented x 3, CN2-12 intact, strength normal upper extremities and lower extremities, sensation normal throughout, DTRs 2+ throughout, no cerebellar signs, gait normal Psychiatric: normal affect, behavior normal, pleasant  GU: normal male external genitalia, nontender, no masses, no hernia, no lymphadenopathy Rectal: anus with inferior small to medium sized hemorrhoid, prostate WNL, no obvious nodules.    Assessment and Plan :    Encounter Diagnoses  Name Primary?  . Routine general medical examination at a health care facility Yes  . Essential hypertension, benign   . Hyperlipidemia   . ED (erectile dysfunction)   . Tobacco use   . Prostate cancer screening   . Impaired fasting glucose   . Fatigue   . Libido, decreased   . COPD (chronic obstructive pulmonary disease)    Physical exam - discussed healthy lifestyle, diet, exercise, preventative care, vaccinations, and addressed their concerns.    Hypertension - begin Lisinopril 10mg  daily.  Discussed risks of untreated hypertension.  Discussed his risk factors for heart disease - tobacco, male age 25yo, HTN,  hyperlipidemia, prior carotid stenting.  Encouraged aggressive risk factor reduction.  Advised he begin Aspirin 81mg  daily.     Hyperlipidemia - noncompliance.  Labs today.   Will likely restart statin.  ED - pending labs.  C/t Cialis prn.  Tobacco use - 3 ppd!  Discussed dangers of tobacco use.   Strongly encouraged he cut down.   He declines Chantix, Wellbutrin, or prescription.  He will look into the electronic cigarette, and will consider stopping.     PSA today.  Impaired fasting glucose - hgba1c  Fatigue and decrease libido - possibly hypogonadism.  Testosterone levels today.    COPD - again encouraged he stop tobacco.   He has not been compliant with medication Qvar. CXR today with no obvious abnormalities.   Will send xray for overread.    Follow-up pending labs.

## 2012-03-31 LAB — CBC WITH DIFFERENTIAL/PLATELET
Basophils Absolute: 0.1 10*3/uL (ref 0.0–0.1)
Basophils Relative: 1 % (ref 0–1)
Eosinophils Absolute: 0.1 10*3/uL (ref 0.0–0.7)
Eosinophils Relative: 2 % (ref 0–5)
HCT: 44.9 % (ref 39.0–52.0)
Hemoglobin: 15.3 g/dL (ref 13.0–17.0)
Lymphocytes Relative: 30 % (ref 12–46)
Lymphs Abs: 1.8 10*3/uL (ref 0.7–4.0)
MCH: 31.4 pg (ref 26.0–34.0)
MCHC: 34.1 g/dL (ref 30.0–36.0)
MCV: 92 fL (ref 78.0–100.0)
Monocytes Absolute: 0.5 10*3/uL (ref 0.1–1.0)
Monocytes Relative: 9 % (ref 3–12)
Neutro Abs: 3.5 10*3/uL (ref 1.7–7.7)
Neutrophils Relative %: 58 % (ref 43–77)
Platelets: 208 10*3/uL (ref 150–400)
RBC: 4.88 MIL/uL (ref 4.22–5.81)
RDW: 14.1 % (ref 11.5–15.5)
WBC: 6 10*3/uL (ref 4.0–10.5)

## 2012-03-31 LAB — COMPREHENSIVE METABOLIC PANEL
ALT: 16 U/L (ref 0–53)
AST: 21 U/L (ref 0–37)
Albumin: 4.7 g/dL (ref 3.5–5.2)
Alkaline Phosphatase: 65 U/L (ref 39–117)
BUN: 14 mg/dL (ref 6–23)
CO2: 25 mEq/L (ref 19–32)
Calcium: 9.8 mg/dL (ref 8.4–10.5)
Chloride: 101 mEq/L (ref 96–112)
Creat: 0.83 mg/dL (ref 0.50–1.35)
Glucose, Bld: 105 mg/dL — ABNORMAL HIGH (ref 70–99)
Potassium: 4.6 mEq/L (ref 3.5–5.3)
Sodium: 137 mEq/L (ref 135–145)
Total Bilirubin: 0.4 mg/dL (ref 0.3–1.2)
Total Protein: 7.3 g/dL (ref 6.0–8.3)

## 2012-03-31 LAB — HEMOGLOBIN A1C
Hgb A1c MFr Bld: 5.9 % — ABNORMAL HIGH (ref <5.7)
Mean Plasma Glucose: 123 mg/dL — ABNORMAL HIGH (ref <117)

## 2012-03-31 LAB — LIPID PANEL
Cholesterol: 261 mg/dL — ABNORMAL HIGH (ref 0–200)
HDL: 52 mg/dL (ref >39)
LDL Cholesterol: 173 mg/dL — ABNORMAL HIGH (ref 0–99)
Total CHOL/HDL Ratio: 5 Ratio
Triglycerides: 180 mg/dL — ABNORMAL HIGH (ref <150)
VLDL: 36 mg/dL (ref 0–40)

## 2012-03-31 LAB — PSA: PSA: 1.36 ng/mL (ref <=4.00)

## 2012-03-31 LAB — TESTOSTERONE: Testosterone: 544.53 ng/dL (ref 300–890)

## 2012-04-01 ENCOUNTER — Other Ambulatory Visit: Payer: Self-pay | Admitting: Medical

## 2012-04-01 MED ORDER — ATORVASTATIN CALCIUM 40 MG PO TABS: 40.0000 mg | ORAL_TABLET | Freq: Every day | ORAL | Status: DC

## 2012-04-07 ENCOUNTER — Other Ambulatory Visit: Payer: Self-pay | Admitting: Medical

## 2012-04-07 DIAGNOSIS — J449 Chronic obstructive pulmonary disease, unspecified: Secondary | ICD-10-CM

## 2012-04-07 DIAGNOSIS — F17200 Nicotine dependence, unspecified, uncomplicated: Secondary | ICD-10-CM

## 2012-04-07 DIAGNOSIS — R911 Solitary pulmonary nodule: Secondary | ICD-10-CM

## 2012-04-07 DIAGNOSIS — R9389 Abnormal findings on diagnostic imaging of other specified body structures: Secondary | ICD-10-CM

## 2012-04-10 ENCOUNTER — Other Ambulatory Visit: Payer: 59

## 2012-04-16 ENCOUNTER — Ambulatory Visit
Admission: RE | Admit: 2012-04-16 | Discharge: 2012-04-16 | Disposition: A | Discharge status: Home / Self Care | Payer: 59 | Source: Ambulatory Visit | Attending: Medical | Admitting: Medical

## 2012-04-16 DIAGNOSIS — J449 Chronic obstructive pulmonary disease, unspecified: Secondary | ICD-10-CM

## 2012-04-16 DIAGNOSIS — R9389 Abnormal findings on diagnostic imaging of other specified body structures: Secondary | ICD-10-CM

## 2012-04-16 DIAGNOSIS — R911 Solitary pulmonary nodule: Secondary | ICD-10-CM

## 2012-04-16 DIAGNOSIS — F17200 Nicotine dependence, unspecified, uncomplicated: Secondary | ICD-10-CM

## 2012-04-16 MED ORDER — IOHEXOL 300 MG/ML  SOLN
75.0000 mL | Freq: Once | INTRAMUSCULAR | Status: AC | PRN
  Administered 2012-04-16: 75 mL via INTRAVENOUS

## 2012-04-20 ENCOUNTER — Telehealth: Payer: Self-pay | Admitting: Medical

## 2012-04-20 NOTE — Telephone Encounter (Signed)
See msg, call pt

## 2012-04-20 NOTE — Telephone Encounter (Signed)
PATIENT IS AWARE OF HIS CT RESULTS. CLS

## 2012-05-26 ENCOUNTER — Encounter: Payer: Self-pay | Admitting: Medical

## 2012-05-26 ENCOUNTER — Ambulatory Visit (INDEPENDENT_AMBULATORY_CARE_PROVIDER_SITE_OTHER): Payer: 59 | Admitting: Medical

## 2012-05-26 VITALS — BP 120/80 | HR 60 | Temp 97.9°F | Resp 14 | Wt 141.0 lb

## 2012-05-26 DIAGNOSIS — J439 Emphysema, unspecified: Secondary | ICD-10-CM | POA: Insufficient documentation

## 2012-05-26 DIAGNOSIS — J438 Other emphysema: Secondary | ICD-10-CM

## 2012-05-26 DIAGNOSIS — N529 Male erectile dysfunction, unspecified: Secondary | ICD-10-CM

## 2012-05-26 DIAGNOSIS — F17200 Nicotine dependence, unspecified, uncomplicated: Secondary | ICD-10-CM

## 2012-05-26 DIAGNOSIS — I10 Essential (primary) hypertension: Secondary | ICD-10-CM

## 2012-05-26 DIAGNOSIS — E785 Hyperlipidemia, unspecified: Secondary | ICD-10-CM

## 2012-05-26 DIAGNOSIS — I7090 Unspecified atherosclerosis: Secondary | ICD-10-CM

## 2012-05-26 DIAGNOSIS — I2510 Atherosclerotic heart disease of native coronary artery without angina pectoris: Secondary | ICD-10-CM | POA: Insufficient documentation

## 2012-05-26 MED ORDER — LISINOPRIL 10 MG PO TABS: 10.0000 mg | ORAL_TABLET | Freq: Every day | ORAL | Status: DC

## 2012-05-26 MED ORDER — SILDENAFIL CITRATE 50 MG PO TABS: 50.0000 mg | ORAL_TABLET | Freq: Every day | ORAL | Status: DC | PRN

## 2012-05-26 NOTE — Progress Notes (Signed)
Subjective:   HPI  Manuel Richardson is a 56 y.o. male who presents for general recheck on chronic issues.  He is here in f/u on hypertension, hyperlipidemia, tobacco abuse, COPD, and ED.   I last saw him 03/30/12 for a physical.  At that time he was advised to restart his medications as he was noncompliant.  Since then he has restarted Lisinopril, Lipitor, aspirin 81mg  daily.  He did not start Qvar as he reports no additional fatigue or dyspnea.   He did try Cialis but this didn't work.  He used a friend's Viagra 50mg  which worked much better.  He would like to try a prescription for this.  No other c/o.  The following portions of the patient's history were reviewed and updated as appropriate: allergies, current medications, past family history, past medical history, past social history, past surgical history and problem list.  Past Medical History  Diagnosis Date  . Hyperlipidemia   . COPD (chronic obstructive pulmonary disease)   . Full dentures   . Erectile dysfunction   . GERD (gastroesophageal reflux disease)   . Tobacco use disorder     No Known Allergies   Review of Systems ROS reviewed and was negative other than noted in HPI or above.    Objective:   Physical Exam  General appearance: alert, no distress, WD/WN Neck: supple, no lymphadenopathy, no thyromegaly, no masses Heart: RRR, normal S1, S2, no murmurs Lungs:decreased breath sounds, no wheezes, rhonchi, or rales Abdomen: +bs, soft, non tender, non distended, no masses, no hepatomegaly, no splenomegaly Pulses: 1+ symmetric, upper and lower extremities, normal cap refill   Assessment and Plan :     Encounter Diagnoses  Name Primary?  . Tobacco use disorder Yes  . Essential hypertension, benign   . Hyperlipidemia   . COPD with emphysema   . ED (erectile dysfunction)   . Atherosclerosis    Tobacco use - again reiterated the need to stop.  Offered options for therapy.  He declines medication.  Advised 1-800-QUIT-NOW  for counseling, discussed strategies to quit.  Advised that this is his biggest risk factor for his medical problems.  HTN - at goal, c/t Lisinopril.  Hyperlipidemia - return for fasting labs.  C/t Lipitor started last visit.  COPD - advised tobacco cessation.  He never started Qvar, but currently no dyspnea, fatigue.    ED - no improvement with Cialis.  Begin Viagra trial, 50mg , discussed risks, possible complications.  Atherosclerosis - c/t Aspirin 81mg  daily, meds as noted above. He is s/p carotid endarterectomy.

## 2012-05-27 ENCOUNTER — Telehealth: Payer: Self-pay | Admitting: Internal Medicine

## 2012-05-27 DIAGNOSIS — I10 Essential (primary) hypertension: Secondary | ICD-10-CM

## 2012-05-27 MED ORDER — LISINOPRIL 10 MG PO TABS: 10.0000 mg | ORAL_TABLET | Freq: Every day | ORAL | Status: DC

## 2012-05-27 NOTE — Telephone Encounter (Signed)
Pt wanted lisinopril 10mg  #90 with no refills

## 2012-06-09 ENCOUNTER — Other Ambulatory Visit: Payer: Self-pay | Admitting: Medical

## 2012-06-09 NOTE — Telephone Encounter (Signed)
RX refill

## 2012-09-21 ENCOUNTER — Other Ambulatory Visit: Payer: Self-pay | Admitting: Medical

## 2012-09-22 NOTE — Telephone Encounter (Signed)
Is this ok?

## 2012-09-29 ENCOUNTER — Telehealth: Payer: Self-pay | Admitting: Medical

## 2012-09-30 NOTE — Telephone Encounter (Signed)
Needs appt for general recheck on a few issues

## 2012-09-30 NOTE — Telephone Encounter (Signed)
LM

## 2012-11-03 ENCOUNTER — Telehealth: Payer: Self-pay | Admitting: Internal Medicine

## 2012-11-03 ENCOUNTER — Other Ambulatory Visit: Payer: Self-pay | Admitting: Medical

## 2012-11-03 MED ORDER — SILDENAFIL CITRATE 50 MG PO TABS: 50.0000 mg | ORAL_TABLET | ORAL | Status: DC | PRN

## 2012-11-04 NOTE — Telephone Encounter (Signed)
done

## 2012-11-06 ENCOUNTER — Ambulatory Visit (INDEPENDENT_AMBULATORY_CARE_PROVIDER_SITE_OTHER): Payer: 59 | Admitting: Medical

## 2012-11-06 ENCOUNTER — Encounter: Payer: Self-pay | Admitting: Medical

## 2012-11-06 VITALS — BP 130/80 | HR 80 | Temp 98.0°F | Resp 16 | Wt 147.0 lb

## 2012-11-06 DIAGNOSIS — I10 Essential (primary) hypertension: Secondary | ICD-10-CM

## 2012-11-06 DIAGNOSIS — Z9119 Patient's noncompliance with other medical treatment and regimen: Secondary | ICD-10-CM

## 2012-11-06 DIAGNOSIS — E785 Hyperlipidemia, unspecified: Secondary | ICD-10-CM

## 2012-11-06 DIAGNOSIS — R0989 Other specified symptoms and signs involving the circulatory and respiratory systems: Secondary | ICD-10-CM

## 2012-11-06 DIAGNOSIS — F17200 Nicotine dependence, unspecified, uncomplicated: Secondary | ICD-10-CM

## 2012-11-06 DIAGNOSIS — N529 Male erectile dysfunction, unspecified: Secondary | ICD-10-CM

## 2012-11-06 DIAGNOSIS — Z91199 Patient's noncompliance with other medical treatment and regimen due to unspecified reason: Secondary | ICD-10-CM

## 2012-11-06 MED ORDER — ATORVASTATIN CALCIUM 40 MG PO TABS: 40.0000 mg | ORAL_TABLET | Freq: Every day | ORAL | Status: DC

## 2012-11-06 MED ORDER — LISINOPRIL 10 MG PO TABS: 10.0000 mg | ORAL_TABLET | Freq: Every day | ORAL | Status: DC

## 2012-11-06 MED ORDER — SILDENAFIL CITRATE 50 MG PO TABS: 50.0000 mg | ORAL_TABLET | ORAL | Status: DC | PRN

## 2012-11-06 NOTE — Progress Notes (Signed)
Subjective: Here for recheck.  Feels like he needs to get back on his medications.  The main reason he is here is because he ran out of his ED medication which we would not refill without recheck.  He has not been taking his BP or lipid medications for months although last visit I strongly counseled him on taking these medications.   Hyperlipidemia - medications made him feel weird, so he stopped this.    Hypertension - he is worried about this, does want to restart this.  A friend of his is in the hospital currently with heart problems.   Been having some low back pain this past week, denies specific injury, but just a little achy.  Using some aspirin for this.  No fever, loss of bowel or bladder function, no abdominal pain.  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  ED - does well on Viagra without c/o.   Wants refill.  Past Medical History  Diagnosis Date  . Hyperlipidemia   . COPD (chronic obstructive pulmonary disease)   . Full dentures   . Erectile dysfunction   . GERD (gastroesophageal reflux disease)   . Tobacco use disorder    Review of Systems Constitutional: -fever, -chills, -sweats, -unexpected weight change,-fatigue ENT: -runny nose, -ear pain, -sore throat Cardiology:  -chest pain, -palpitations, -edema Respiratory: -cough,  -wheezing Gastroenterology: -abdominal pain, -nausea, -vomiting, -diarrhea, -constipation  Hematology: -bleeding or bruising problems Ophthalmology: -vision changes Urology: -dysuria, -difficulty urinating, -hematuria, -urinary frequency, -urgency Neurology: -headache, -weakness, -tingling, -numbness    Objective: Gen: wd, wn, thin white male Neck: supple, nontender, no mass, no thyromegaly, left carotid bruit, right neck no bruit Heart: RRR, normal S1, S2, no murmurs Lungs: decreased breath sounds, no wheezes, no rhonchi or rales Abdomen: +bs, soft, nontender, no organomegaly, no masses, +right  femoral bruit Pulses 1+ right pedal pulses, 2+ left pedal pulses, normal cap refill, UE pulses normal Ext: no edema, clubbing or cyanosis Back: nontender, normal ROM, no midline tenderness Neuro: normal LE strength, sensation, normal heel and toe walk, normal DTRs  Assessment: Encounter Diagnoses  Name Primary?  . Hyperlipidemia Yes  . Essential hypertension, benign   . Tobacco use disorder   . Carotid bruit   . Femoral bruit   . ED (erectile dysfunction)   . Noncompliance    Plan: i reiterated the importance of risk factor reduction and his risk of cardiovascular and cerebrovascular disease.  Restart Lipitor, lisinopril, aspirin 81mg  QHS.  discussed risks of tobacco use, risks of uncontrolled hypertension, cardiovascular risks.  Once again advise he consider stopping tobacco given the risks.  Can use OTC aleve prn for back pain, likely due to musculoskeletal pain, mild.  I am going to refer him back to cardiovascular for evaluation of his bruits both left carotid and right femoral and decreased pulses on the right pedal.  He does not have any claudication symptoms at current.  He did have coronary calcifications on prior chest CT in 2012.  Refilled Viagra.  F/u with cardiology, will need to recheck lipids here in 6mo.

## 2012-11-06 NOTE — Patient Instructions (Addendum)
Still consider letting the tobacco go.  Restart Lipitor 40mg  daily at bedtime along with 81mg  OTC Aspirin every night.  Restart Lisinopril 10mg  daily in the morning.   You can start this 1- 2 weeks after Lipitor if you want, to make sure you are not having side effects from the Lipitor.   I refilled your Viagra today.

## 2012-11-09 ENCOUNTER — Telehealth: Payer: Self-pay | Admitting: Family Medicine

## 2012-11-09 NOTE — Telephone Encounter (Signed)
Message copied by Janeice Robinson on Mon Nov 09, 2012 11:39 AM ------      Message from: Aleen Campi, DAVID S      Created: Mon Nov 09, 2012  7:54 AM       If I haven't already sent you message.  He needs referral back to cardiovascular for left carotid and right femoral bruits, decreased pulses in right lower leg.

## 2012-11-09 NOTE — Telephone Encounter (Signed)
I fax over OV notes , labs and insurance card to [facility] for themto schedule the patients appointment to see Dr. Allyson Sabal. CLS

## 2013-05-31 ENCOUNTER — Telehealth: Payer: Self-pay | Admitting: Internal Medicine

## 2013-05-31 NOTE — Telephone Encounter (Signed)
Called pt and pt states that he is trying to work out the bill he owes or doesn't owe before he comes in. He was told he owes and then he was told he didn't owe but then got another bill stating he owed so he is trying to see what he needs to do before he comes in. He will cal back when he gets it straighten out

## 2013-05-31 NOTE — Telephone Encounter (Signed)
Message copied by Joslyn Hy on Mon May 31, 2013 10:26 AM ------      Message from: Jac Canavan      Created: Mon May 31, 2013  9:42 AM       Due for fasting f/u OV ------

## 2014-05-17 DIAGNOSIS — Z0279 Encounter for issue of other medical certificate: Secondary | ICD-10-CM

## 2014-05-31 ENCOUNTER — Telehealth: Payer: Self-pay | Admitting: Internal Medicine

## 2014-05-31 NOTE — Telephone Encounter (Signed)
Faxed over medical records to [facility] @ [phone] 

## 2014-07-18 ENCOUNTER — Encounter: Payer: Self-pay | Admitting: Family Medicine

## 2014-07-18 ENCOUNTER — Ambulatory Visit (INDEPENDENT_AMBULATORY_CARE_PROVIDER_SITE_OTHER): Payer: 59 | Admitting: Family Medicine

## 2014-07-18 VITALS — BP 138/80 | HR 68 | Ht 69.0 in | Wt 139.0 lb

## 2014-07-18 DIAGNOSIS — Z23 Encounter for immunization: Secondary | ICD-10-CM

## 2014-07-18 DIAGNOSIS — J438 Other emphysema: Secondary | ICD-10-CM

## 2014-07-18 DIAGNOSIS — F17200 Nicotine dependence, unspecified, uncomplicated: Secondary | ICD-10-CM | POA: Insufficient documentation

## 2014-07-18 DIAGNOSIS — J432 Centrilobular emphysema: Secondary | ICD-10-CM

## 2014-07-18 NOTE — Patient Instructions (Signed)
Continue the albuterol 2-3 times/day, if needed for wheezing and chest tightness.   We are sending your info to [employer] about respiratory study.  You will not be a candidate until it has been 6 weeks since steroid and antibiotic use.  Continue to use the Flonase as instructed (can increase up to 2 sprays each nostril daily). Consider sinus rinses (ie neti-pot) and mucinex as needed.  Please try and quit smoking--start thinking about why/when you smoke (habit, boredom, stress) in order to come up with effective strategies to cut back or quit. Available resources to help you quit include free counseling through [facility] ([facility]uit[facility].com or 1-800-QUITNOW), smoking cessation classes through [facility] (call to find out schedule), over-the-counter nicotine replacements, and e-cigarettes (although this may not help break the hand-mouth habit).  Many insurance companies also have smoking cessation programs (which may decrease the cost of patches, meds if enrolled).  If these methods are not effective for you, and you are motivated to quit, return to discuss the possibility of prescription medications.

## 2014-07-18 NOTE — Progress Notes (Signed)
Chief Complaint  Patient presents with  . COPD    COPD sinus issues, went to [facility] urgent care and got inhaler, and antibotic and did that for a week and is feeling better.    He started with head and sinus congestion on 9/11. He had bad cough, shortness of breath (was working in [employer], in the heat). He went to [facility] urgent care on 9/14 and was treated with z-pak (liquid, per pt request) and medrol dosepak, as well as albuterol inhaler. He finished the steroids yesterday. Since that time he cut back on his smoking from 3PPD to just 1 PPD. The dyspnea really scared him, and he now seems motivated to work on quitting smoking.  He states he is now  is now willing to do what he is told (in the past he never used inhaler, didn't take cholesterol meds, etc).  He hasn't had PFT's in many years.  He also reports that he had also had some ear pain and wax buildup on the right ear--they removed wax at his urgent care visit.  No further ear pain or decrease in hearing.    He feels "85% better"--he still has slight head congestion.  His breathing is back to baseline.  He is no longer having any dyspnea.  Prior to this illness, he never used the albuterol (but had been prescribed it).  He continues to use the albuterol 2 puffs TID.   He started using Flonase 2 days ago (bought it OTC).  He has some allergies, usually in the spring.    Past Medical History  Diagnosis Date  . Hyperlipidemia   . COPD (chronic obstructive pulmonary disease)   . Full dentures   . Erectile dysfunction   . GERD (gastroesophageal reflux disease)   . Tobacco use disorder    Past Surgical History  Procedure Laterality Date  . Carotid artery surgery  2012    stent in left side  . Elbow surgery  teenager    left  . Colonoscopy  2007   History   Social History  . Marital Status: Married    Spouse Name: N/A    Number of Children: N/A  . Years of Education: N/A   Occupational History  . 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 Smoking status: Current Every Day Smoker -- 3.00 packs/day for 38 years  . Smokeless tobacco: Not on file     Comment: decreased to 1 PPD since illness last week (06/2014)  . Alcohol Use: 7.2 oz/week    12 Cans of beer per week  . Drug Use: No  . Sexual Activity: Not on file     Comment: no exercise.   Other Topics Concern  . Not on file   Social History Narrative   Married, 1 daughter in [place], exercise - walking on job   Outpatient Encounter Prescriptions as of 07/18/2014  Medication Sig  . albuterol (PROAIR HFA) 108 (90 BASE) MCG/ACT inhaler Inhale 2 puffs into the lungs 3 (three) times daily.  . sildenafil (VIAGRA) 50 MG tablet Take 1 tablet (50 mg total) by mouth as needed for erectile dysfunction.   No Known Allergies  ROS: Hasn't had fevers, chills, nausea, vomiting.  He had just slight diarrhea from the antibiotics, resolved.  No abdominal pain, bleeding, bruising, rashes.  Denies headaches, dizziness, chest pain, palpitations. No headaches, dizziness, chest pain. Dyspnea has resolved.  See HPI.  PHYSICAL EXAM: BP 138/80  Pulse 68  Ht  (1.753 m)  Wt 139 lb (63.05 kg)  BMI 20.52 kg/m2  SpO2 96%  Well developed, pleasant male in no distress.  Speaking easily, no coughing, some nasal congestion and mouth-breathing noted, occasional sniffle HEENT: PERRL, conjunctiva clear. TM's and EAC's normal. Nasal mucosa is moderately edematous with some thick white/yellow crusting and mucus in nares bilaterally.  No erythema of mucus membranes. Sinuses are nontender.  OP--brown staining of tongue.  Dentures Neck: no lymphadenopathy, thyromegaly or mass Heart: regular rate and rhythm, no murmur Lungs: clear bilaterally without wheezes, rales, ronchi.  Fair air movement Skin: no rashes Neuro: alert and oriented, cranial nerves intact, normal strength, gait Psych: normal mood, affect, hygiene and grooming  Review of chart--CT from 2013 showed  Severe centrilobular emphysema primarily involving the upper  lobes bilaterally.  ASSESSMENT/PLAN:  Centrilobular emphysema  Tobacco use disorder  Need for prophylactic vaccination and inoculation against influenza - Plan: Flu Vaccine QUAD 36+ mos IM  Sinusitis and COPD flare--significantly improved.  Continue Flonase. Consider sinus rinses and/or mucinex.  Needs PFT's and smoking cessation. Continue albuterol BID-TID as needed.  Return if increasing shortness of breath recurs. (steroids just completed yesterday)  Interested in COPD study ([employer]).  He will not be a candidate until he is 6 wks s/p steroids and ABX.  Info will be faxed.  This should provide him with free PFT's  Counseled re: smoking cessation in detail  Counseled on proper use of flonase  Flu shot given today.  Schedule CPE with Shane  25 min visit, more than 1/2 spent counseling.  Detailed counseling re: COPD, risks of smoking, and counseling on smoking cessation

## 2014-08-03 DIAGNOSIS — Z029 Encounter for administrative examinations, unspecified: Secondary | ICD-10-CM

## 2014-08-27 ENCOUNTER — Telehealth: Payer: Self-pay | Admitting: Internal Medicine

## 2014-08-27 NOTE — Telephone Encounter (Signed)
[facility] [employer] [facility] @ [phone]

## 2015-02-16 ENCOUNTER — Other Ambulatory Visit: Payer: Self-pay | Admitting: Specialist

## 2015-02-16 DIAGNOSIS — M25551 Pain in right hip: Secondary | ICD-10-CM

## 2015-02-16 DIAGNOSIS — M25552 Pain in left hip: Principal | ICD-10-CM

## 2015-02-22 ENCOUNTER — Ambulatory Visit
Admission: RE | Admit: 2015-02-22 | Discharge: 2015-02-22 | Disposition: A | Discharge status: Home / Self Care | Payer: 59 | Source: Ambulatory Visit | Attending: Specialist | Admitting: Specialist

## 2015-02-22 DIAGNOSIS — M25552 Pain in left hip: Principal | ICD-10-CM

## 2015-02-22 DIAGNOSIS — M25551 Pain in right hip: Secondary | ICD-10-CM

## 2015-03-01 ENCOUNTER — Other Ambulatory Visit: Payer: Self-pay | Admitting: *Deleted

## 2015-03-01 DIAGNOSIS — I739 Peripheral vascular disease, unspecified: Secondary | ICD-10-CM

## 2015-04-05 ENCOUNTER — Encounter: Payer: Self-pay | Admitting: Surgery

## 2015-04-10 ENCOUNTER — Ambulatory Visit (HOSPITAL_COMMUNITY)
Admission: RE | Admit: 2015-04-10 | Discharge: 2015-04-10 | Disposition: A | Discharge status: Home / Self Care | Payer: 59 | Source: Ambulatory Visit | Attending: Surgery | Admitting: Surgery

## 2015-04-10 ENCOUNTER — Ambulatory Visit (INDEPENDENT_AMBULATORY_CARE_PROVIDER_SITE_OTHER): Payer: 59 | Admitting: Surgery

## 2015-04-10 ENCOUNTER — Encounter: Payer: Self-pay | Admitting: Surgery

## 2015-04-10 VITALS — BP 150/80 | HR 76 | Temp 97.7°F | Resp 18 | Ht 69.0 in | Wt 146.0 lb

## 2015-04-10 DIAGNOSIS — I70219 Atherosclerosis of native arteries of extremities with intermittent claudication, unspecified extremity: Secondary | ICD-10-CM

## 2015-04-10 DIAGNOSIS — I739 Peripheral vascular disease, unspecified: Secondary | ICD-10-CM | POA: Diagnosis not present

## 2015-04-10 NOTE — Progress Notes (Signed)
Referred by:  Jac Canavan, PA-C [address] [place], [place] [address]  Reason for referral: right lower extremity claudication  History of Present Illness  Manuel Richardson is a 59 y.o. (May 13, 1956) male who presents with chief complaint: right hip and buttock claudication that has been present for one year. He describes the pain as burning and occurs after walking 30 yards and after climbing stairs. He works in [employer] is constantly going up and down stairs. He denies any calf claudication or rest pain. He denies any issues with his left leg. He denies a history of non-healing wounds. He does note having calf cramps intermittently at night that is relieved with a "spoon of mustard." He has been seen by Dr. Leandra Kern and orthopedic etiology has been ruled out. He is known to Dr. 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 He has not been seen since 2012. He has recovered well from surgery and denies any left upper extremity symptoms.   He is a 2 pack per day smoker. He says he is motivated to quit. He has a past medical history of hyperlipidemia and hypertension but is not on medication. He did not like how the statins and antihypertensives made him feel. He has no history of CAD. He has a history of COPD and ED. He is not diabetic  Past Medical History  Diagnosis Date  . Hyperlipidemia   . COPD (chronic obstructive pulmonary disease)   . Full dentures   . Erectile dysfunction   . GERD (gastroesophageal reflux disease)   . Tobacco use disorder     Past Surgical History  Procedure Laterality Date  . Carotid artery surgery  2012    stent in left side  . Elbow surgery  teenager    left  . Colonoscopy  2007    History   Social History  . Marital Status: Married    Spouse Name: N/A  . Number of Children: N/A  . Years of Education: N/A   Occupational History  . 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 Smoking status: Current Every Day Smoker -- 2.00 packs/day for 38 years    Types: Cigarettes  . Smokeless tobacco: Not on file     Comment: decreased to 1 PPD since illness last week (06/2014)  . Alcohol Use: 7.2 oz/week    12 Cans of beer per week  . Drug Use: No  . Sexual Activity: Not on file     Comment: no exercise.   Other Topics Concern  . Not on file   Social History Narrative   Married, 1 daughter in [place], exercise - walking on job    Family History  Problem Relation Age of Onset  . Heart disease Mother     died 40 with MI  . Cancer Father     lung cancer  . Hypertension Brother   . Diabetes Neg Hx   . Stroke Neg Hx     Current Outpatient Prescriptions on File Prior to Visit  Medication Sig Dispense Refill  . albuterol (PROAIR HFA) 108 (90 BASE) MCG/ACT inhaler Inhale 2 puffs into the lungs 3 (three) times daily.    . sildenafil (VIAGRA) 50 MG tablet Take 1 tablet (50 mg total) by mouth as needed for erectile dysfunction. (Patient not taking: Reported on 04/10/2015) 10 tablet 3   No current facility-administered medications on file  prior to visit.    No Known Allergies  REVIEW OF SYSTEMS:  (Positives checked otherwise negative)  CARDIOVASCULAR:   chest pain,  chest pressure,  palpitations,  shortness of breath when laying flat,  shortness of breath with exertion,   [x ] pain in legs when walking,  pain in feet when laying flat,  history of blood clot in veins (DVT),  history of phlebitis,  swelling in legs,  varicose veins  PULMONARY:   productive cough,  asthma,  wheezing  NEUROLOGIC:   weakness in arms or legs,  numbness in arms or legs,  difficulty speaking or slurred speech,  temporary loss of vision in one eye,  dizziness  HEMATOLOGIC:   bleeding problems,  problems with blood clotting too easily  MUSCULOSKEL:   joint pain,  joint swelling  GASTROINTEST:     Vomiting blood,   Blood in stool     GENITOURINARY:    Burning with urination,   Blood in urine  PSYCHIATRIC:   history of major depression  INTEGUMENTARY:   rashes,  ulcers  CONSTITUTIONAL:   fever,  chills  For VQI Use Only  PRE-ADM LIVING: Home  AMB STATUS: Ambulatory  CAD Sx: None  PRIOR CHF: None  STRESS TEST:  No,  Normal,  + ischemia,  + MI,  Both   Physical Examination Filed Vitals:   04/10/15 1319  BP: 150/80  Pulse: 76  Temp: 97.7 F (36.5 C)  TempSrc: Oral  Resp: 18  Height:  (1.753 m)  Weight: 146 lb (66.225 kg)  SpO2: 98%   Body mass index is 21.55 kg/(m^2).  General: A&O x 3, WDWN male in NAD  Head: [place]/AT  Neck: Supple, no nuchal rigidity  Pulmonary: Sym exp, good air movt, CTAB, no rales, rhonchi, & wheezing  Cardiac: RRR, Nl S1, S2, no Murmurs, rubs or gallops, left carotid bruit  Vascular: 2+ radial pulses b/l, 2+ left femoral pulse, nonpalpable right femoral pulse, nonpalpable right pedal pulses, 1-2+ left dorsalis pedis pulse  Gastrointestinal: soft, NTND, -G/R, - HSM, - masses  Musculoskeletal: M/S 5/5 throughout, Extremities without ischemic changes.   Neurologic: CN 2-12 grossly intact, Pain and light touch intact in extremities  Psychiatric: Judgment intact, Mood & affect appropriatefor pt's clinical situation  Dermatologic: See M/S exam for extremity exam, no rashes otherwise noted    Non-Invasive Vascular Imaging  ABI (Date: 02/22/2015)  R: 0.53   L: 1.04  Lower extremity arterial duplex Monophasic flow throughout right lower extremity  Outside Studies/Documentation 2 pages of outside documents were reviewed including: ABIs and arterial duplex results from [facility] Decision Making  CARMERON HEADY is a 59 y.o. male who presents with intermittent right hip and buttock claudication.  He has a nonpalpable right femoral pulse and normal left femoral pulse.  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 Discussed arteriogram with possible intervention. The patient is currently very busy with work and is unable to afford to take time off. He says work will be slower in the winter. He is able to tolerate the pain but does want to pursue treatment in the future. He was counseled > 2 minutes for smoking cessation. He will follow up in 6 months for further discussion. He knows to return earlier if his symptoms worsen. He  takes a daily aspirin. He does not take a statin by choice.   Maris Berger, PA-C [facility] Office: [phone] Pager: [phone]  04/10/2015, 1:57 PM  The patient was seen in conjunction with Dr. Myra Gianotti  I agree with the above.  I have seen and evaluated the patient.  He appears to have claudication symptoms in his right buttocks.  This is supported by his duplex scan which shows significant inflow disease and a decrease in his ABIs.  The next step would be to proceed with angiography via a left femoral approach.  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  He tells me that he can tolerate his level of discomfort right now and will contact me when business slows down to the we can proceed with angiography.  Durene Cal

## 2015-04-10 NOTE — Patient Instructions (Signed)
Please review the tobacco cessation information given to you today. It lists many hints that are useful in your effort to stop smoking. The [facility] contact phone # is [phone] These nurses and advisors offer lots of FREE information and aids to help you quit.    The [facility]uit [facility]oking line #  (450)376-2433, they will also assist you with programs designed to help you stop smoking.    Smoking Cessation, Tips for Success If you are ready to quit smoking, congratulations! You have chosen to help yourself be healthier. Cigarettes bring nicotine, tar, carbon monoxide, and other irritants into your body. Your lungs, heart, and blood vessels will be able to work better without these poisons. There are many different ways to quit smoking. Nicotine gum, nicotine patches, a nicotine inhaler, or nicotine nasal spray can help with physical craving. Hypnosis, support groups, and medicines help break the habit of smoking. WHAT THINGS CAN I DO TO MAKE QUITTING EASIER?  Here are some tips to help you quit for good:  Pick a date when you will quit smoking completely. Tell all of your friends and family about your plan to quit on that date.  Do not try to slowly cut down on the number of cigarettes you are smoking. Pick a quit date and quit smoking completely starting on that day.  Throw away all cigarettes.   Clean and remove all ashtrays from your home, work, and car.  On a card, write down your reasons for quitting. Carry the card with you and read it when you get the urge to smoke.  Cleanse your body of nicotine. Drink enough water and fluids to keep your urine clear or pale yellow. Do this after quitting to flush the nicotine from your body.  Learn to predict your moods. Do not let a bad situation be your excuse to have a cigarette. Some situations in your life might tempt you into wanting a cigarette.  Never have "just one" cigarette. It leads to wanting another and another. Remind  yourself of your decision to quit.  Change habits associated with smoking. If you smoked while driving or when feeling stressed, try other activities to replace smoking. Stand up when drinking your coffee. Brush your teeth after eating. Sit in a different chair when you read the paper. Avoid alcohol while trying to quit, and try to drink fewer caffeinated beverages. Alcohol and caffeine may urge you to smoke.  Avoid foods and drinks that can trigger a desire to smoke, such as sugary or spicy foods and alcohol.  Ask people who smoke not to smoke around you.  Have something planned to do right after eating or having a cup of coffee. For example, plan to take a walk or exercise.  Try a relaxation exercise to calm you down and decrease your stress. Remember, you may be tense and nervous for the first 2 weeks after you quit, but this will pass.  Find new activities to keep your hands busy. Play with a pen, coin, or rubber band. Doodle or draw things on paper.  Brush your teeth right after eating. This will help cut down on the craving for the taste of tobacco after meals. You can also try mouthwash.   Use oral substitutes in place of cigarettes. Try using lemon drops, carrots, cinnamon sticks, or chewing gum. Keep them handy so they are available when you have the urge to smoke.  When you have the urge to smoke, try deep breathing.  Designate  your home as a nonsmoking area.  If you are a heavy smoker, ask your health care provider about a prescription for nicotine chewing gum. It can ease your withdrawal from nicotine.  Reward yourself. Set aside the cigarette money you save and buy yourself something nice.  Look for support from others. Join a support group or smoking cessation program. Ask someone at home or at work to help you with your plan to quit smoking.  Always ask yourself, "Do I need this cigarette or is this just a reflex?" Tell yourself, "Today, I choose not to smoke," or "I do  not want to smoke." You are reminding yourself of your decision to quit.  Do not replace cigarette smoking with electronic cigarettes (commonly called e-cigarettes). The safety of e-cigarettes is unknown, and some may contain harmful chemicals.  If you relapse, do not give up! Plan ahead and think about what you will do the next time you get the urge to smoke. HOW WILL I FEEL WHEN I QUIT SMOKING? You may have symptoms of withdrawal because your body is used to nicotine (the addictive substance in cigarettes). You may crave cigarettes, be irritable, feel very hungry, cough often, get headaches, or have difficulty concentrating. The withdrawal symptoms are only temporary. They are strongest when you first quit but will go away within 10-14 days. When withdrawal symptoms occur, stay in control. Think about your reasons for quitting. Remind yourself that these are signs that your body is healing and getting used to being without cigarettes. Remember that withdrawal symptoms are easier to treat than the major diseases that smoking can cause.  Even after the withdrawal is over, expect periodic urges to smoke. However, these cravings are generally short lived and will go away whether you smoke or not. Do not smoke! WHAT RESOURCES ARE AVAILABLE TO HELP ME QUIT SMOKING? Your health care provider can direct you to community resources or hospitals for support, which may include:  Group support.  Education.  Hypnosis.  Therapy. Document Released: 07/12/2004 Document Revised: 02/28/2014 Document Reviewed: 04/01/2013 [facility] Patient Information 2015 [place], [place]. This information is not intended to replace advice given to you by your health care provider. Make sure you discuss any questions you have with your health care provider. Smoking Cessation Quitting smoking is important to your health and has many advantages. However, it is not always easy to quit since nicotine is a very addictive drug.  Oftentimes, people try 3 times or more before being able to quit. This document explains the best ways for you to prepare to quit smoking. Quitting takes hard work and a lot of effort, but you can do it. ADVANTAGES OF QUITTING SMOKING  You will live longer, feel better, and live better.  Your body will feel the impact of quitting smoking almost immediately.  Within 20 minutes, blood pressure decreases. Your pulse returns to its normal level.  After 8 hours, carbon monoxide levels in the blood return to normal. Your oxygen level increases.  After 24 hours, the chance of having a heart attack starts to decrease. Your breath, hair, and body stop smelling like smoke.  After 48 hours, damaged nerve endings begin to recover. Your sense of taste and smell improve.  After 72 hours, the body is virtually free of nicotine. Your bronchial tubes relax and breathing becomes easier.  After 2 to 12 weeks, lungs can hold more air. Exercise becomes easier and circulation improves.  The risk of having a heart attack, stroke, cancer, or lung  disease is greatly reduced.  After 1 year, the risk of coronary heart disease is cut in half.  After 5 years, the risk of stroke falls to the same as a nonsmoker.  After 10 years, the risk of lung cancer is cut in half and the risk of other cancers decreases significantly.  After 15 years, the risk of coronary heart disease drops, usually to the level of a nonsmoker.  If you are pregnant, quitting smoking will improve your chances of having a healthy baby.  The people you live with, especially any children, will be healthier.  You will have extra money to spend on things other than cigarettes. QUESTIONS TO THINK ABOUT BEFORE ATTEMPTING TO QUIT You may want to talk about your answers with your health care provider.  Why do you want to quit?  If you tried to quit in the past, what helped and what did not?  What will be the most difficult situations for you  after you quit? How will you plan to handle them?  Who can help you through the tough times? Your family? Friends? A health care provider?  What pleasures do you get from smoking? What ways can you still get pleasure if you quit? Here are some questions to ask your health care provider:  How can you help me to be successful at quitting?  What medicine do you think would be best for me and how should I take it?  What should I do if I need more help?  What is smoking withdrawal like? How can I get information on withdrawal? GET READY  Set a quit date.  Change your environment by getting rid of all cigarettes, ashtrays, matches, and lighters in your home, car, or work. Do not let people smoke in your home.  Review your past attempts to quit. Think about what worked and what did not. GET SUPPORT AND ENCOURAGEMENT You have a better chance of being successful if you have help. You can get support in many ways.  Tell your family, friends, and coworkers that you are going to quit and need their support. Ask them not to smoke around you.  Get individual, group, or telephone counseling and support. Programs are available at [employer] and health centers. Call your local health department for information about programs in your area.  Spiritual beliefs and practices may help some smokers quit.  Download a "quit meter" on your computer to keep track of quit statistics, such as how long you have gone without smoking, cigarettes not smoked, and money saved.  Get a self-help book about quitting smoking and staying off tobacco. LEARN NEW SKILLS AND BEHAVIORS  Distract yourself from urges to smoke. Talk to someone, go for a walk, or occupy your time with a task.  Change your normal routine. Take a different route to work. Drink tea instead of coffee. Eat breakfast in a different place.  Reduce your stress. Take a hot bath, exercise, or read a book.  Plan something enjoyable to do every  day. Reward yourself for not smoking.  Explore interactive web-based programs that specialize in helping you quit. GET MEDICINE AND USE IT CORRECTLY Medicines can help you stop smoking and decrease the urge to smoke. Combining medicine with the above behavioral methods and support can greatly increase your chances of successfully quitting smoking.  Nicotine replacement therapy helps deliver nicotine to your body without the negative effects and risks of smoking. Nicotine replacement therapy includes nicotine gum, lozenges, inhalers, nasal sprays,  and skin patches. Some may be available over-the-counter and others require a prescription.  Antidepressant medicine helps people abstain from smoking, but how this works is unknown. This medicine is available by prescription.  Nicotinic receptor partial agonist medicine simulates the effect of nicotine in your brain. This medicine is available by prescription. Ask your health care provider for advice about which medicines to use and how to use them based on your health history. Your health care provider will tell you what side effects to look out for if you choose to be on a medicine or therapy. Carefully read the information on the package. Do not use any other product containing nicotine while using a nicotine replacement product.  RELAPSE OR DIFFICULT SITUATIONS Most relapses occur within the first 3 months after quitting. Do not be discouraged if you start smoking again. Remember, most people try several times before finally quitting. You may have symptoms of withdrawal because your body is used to nicotine. You may crave cigarettes, be irritable, feel very hungry, cough often, get headaches, or have difficulty concentrating. The withdrawal symptoms are only temporary. They are strongest when you first quit, but they will go away within 10-14 days. To reduce the chances of relapse, try to:  Avoid drinking alcohol. Drinking lowers your chances of  successfully quitting.  Reduce the amount of caffeine you consume. Once you quit smoking, the amount of caffeine in your body increases and can give you symptoms, such as a rapid heartbeat, sweating, and anxiety.  Avoid smokers because they can make you want to smoke.  Do not let weight gain distract you. Many smokers will gain weight when they quit, usually less than 10 pounds. Eat a healthy diet and stay active. You can always lose the weight gained after you quit.  Find ways to improve your mood other than smoking. FOR MORE INFORMATION  www.smokefree.gov  Document Released: 10/08/2001 Document Revised: 02/28/2014 Document Reviewed: 01/23/2012 [facility] Patient Information 2015 [place], [place]. This information is not intended to replace advice given to you by your health care provider. Make sure you discuss any questions you have with your health care provider.

## 2015-06-22 ENCOUNTER — Encounter: Payer: Self-pay | Admitting: Cardiovascular Disease

## 2015-10-03 ENCOUNTER — Encounter: Payer: Self-pay | Admitting: Surgery

## 2015-10-09 ENCOUNTER — Ambulatory Visit: Payer: 59 | Admitting: Surgery

## 2018-09-21 ENCOUNTER — Ambulatory Visit (INDEPENDENT_AMBULATORY_CARE_PROVIDER_SITE_OTHER): Payer: BLUE CROSS/BLUE SHIELD | Admitting: Medical

## 2018-09-21 ENCOUNTER — Other Ambulatory Visit: Payer: Self-pay

## 2018-09-21 ENCOUNTER — Encounter: Payer: Self-pay | Admitting: Medical

## 2018-09-21 VITALS — BP 142/90 | HR 74 | Temp 97.8°F | Resp 16 | Ht 69.0 in | Wt 133.2 lb

## 2018-09-21 DIAGNOSIS — I739 Peripheral vascular disease, unspecified: Secondary | ICD-10-CM

## 2018-09-21 DIAGNOSIS — J432 Centrilobular emphysema: Secondary | ICD-10-CM | POA: Diagnosis not present

## 2018-09-21 DIAGNOSIS — F17200 Nicotine dependence, unspecified, uncomplicated: Secondary | ICD-10-CM

## 2018-09-21 DIAGNOSIS — I10 Essential (primary) hypertension: Secondary | ICD-10-CM | POA: Diagnosis not present

## 2018-09-21 DIAGNOSIS — I7090 Unspecified atherosclerosis: Secondary | ICD-10-CM | POA: Diagnosis not present

## 2018-09-21 DIAGNOSIS — E785 Hyperlipidemia, unspecified: Secondary | ICD-10-CM

## 2018-09-21 DIAGNOSIS — R0989 Other specified symptoms and signs involving the circulatory and respiratory systems: Secondary | ICD-10-CM | POA: Insufficient documentation

## 2018-09-21 DIAGNOSIS — I779 Disorder of arteries and arterioles, unspecified: Secondary | ICD-10-CM

## 2018-09-21 MED ORDER — UMECLIDINIUM-VILANTEROL 62.5-25 MCG/INH IN AEPB: 1.0000 | INHALATION_SPRAY | Freq: Every day | RESPIRATORY_TRACT | 2 refills | Status: DC

## 2018-09-21 MED ORDER — ATORVASTATIN CALCIUM 40 MG PO TABS: 40.0000 mg | ORAL_TABLET | Freq: Every day | ORAL | 0 refills | Status: DC

## 2018-09-21 MED ORDER — LISINOPRIL 10 MG PO TABS: 10.0000 mg | ORAL_TABLET | Freq: Every day | ORAL | 0 refills | Status: DC

## 2018-09-21 MED ORDER — ALBUTEROL SULFATE HFA 108 (90 BASE) MCG/ACT IN AERS: 2.0000 | INHALATION_SPRAY | Freq: Three times a day (TID) | RESPIRATORY_TRACT | 1 refills | Status: DC

## 2018-09-21 MED ORDER — NICOTINE 21 MG/24HR TD PT24: 21.0000 mg | MEDICATED_PATCH | Freq: Every day | TRANSDERMAL | 0 refills | Status: DC

## 2018-09-21 NOTE — Patient Instructions (Addendum)
Recommendations:  Begin daily prevention inhaler Anoro to open up air sacs in the lungs and give you improvement in breathing  You may use Albuterol rescue inhaler 2 puffs every 6 hours as needed for wheezing, coughing fits, shortness of breath  STOP SMOKING!!!  Begin Nicotine patch daily to help reduce urge for tobacco.   Call 1-800-QUIT-NOW for counseling to stop smoking or consider local counselor or hypnotist  After 1 week, begin back on Lipitor cholesterol medication once daily to lower cholesterol and lower heart disease risk  After 1 more week, begin back on Lisinopril daily for blood pressure  Call if medication is too expensive  Call if agreeable to referral to either lung doctor or heart doctor as discussed  Follow up in 1 month     Peripheral Vascular Disease Peripheral vascular disease (PVD) is a disease of the blood vessels that are not part of your heart and brain. A simple term for PVD is poor circulation. In most cases, PVD narrows the blood vessels that carry blood from your heart to the rest of your body. This can result in a decreased supply of blood to your arms, legs, and internal organs, like your stomach or kidneys. However, it most often affects a person's lower legs and feet. There are two types of PVD.  Organic PVD. This is the more common type. It is caused by damage to the structure of blood vessels.  Functional PVD. This is caused by conditions that make blood vessels contract and tighten (spasm).  Without treatment, PVD tends to get worse over time. PVD can also lead to acute ischemic limb. This is when an arm or limb suddenly has trouble getting enough blood. This is a medical emergency. Follow these instructions at home:  Take medicines only as told by your doctor.  Do not use any tobacco products, including cigarettes, chewing tobacco, or electronic cigarettes. If you need help quitting, ask your doctor.  Lose weight if you are overweight, and  maintain a healthy weight as told by your doctor.  Eat a diet that is low in fat and cholesterol. If you need help, ask your doctor.  Exercise regularly. Ask your doctor for some good activities for you.  Take good care of your feet. ? Wear comfortable shoes that fit well. ? Check your feet often for any cuts or sores. Contact a doctor if:  You have cramps in your legs while walking.  You have leg pain when you are at rest.  You have coldness in a leg or foot.  Your skin changes.  You are unable to get or have an erection (erectile dysfunction).  You have cuts or sores on your feet that are not healing. Get help right away if:  Your arm or leg turns cold and blue.  Your arms or legs become red, warm, swollen, painful, or numb.  You have chest pain or trouble breathing.  You suddenly have weakness in your face, arm, or leg.  You become very confused or you cannot speak.  You suddenly have a very bad headache.  You suddenly cannot see. This information is not intended to replace advice given to you by your health care provider. Make sure you discuss any questions you have with your health care provider. Document Released: 01/08/2010 Document Revised: 03/21/2016 Document Reviewed: 03/24/2014 Elsevier Interactive Patient Education  2017 [employer]  Chronic Obstructive Pulmonary Disease Chronic obstructive pulmonary disease (COPD) is a long-term (chronic) lung problem. When you have COPD, it  is hard for air to get in and out of your lungs. The way your lungs work will never return to normal. Usually the condition gets worse over time. There are things you can do to keep yourself as healthy as possible. Your doctor may treat your condition with:  Medicines.  Quitting smoking, if you smoke.  Rehabilitation. This may involve a team of specialists.  Oxygen.  Exercise and changes to your diet.  Lung surgery.  Comfort measures (palliative care).  Follow these  instructions at home: Medicines  Take over-the-counter and prescription medicines only as told by your doctor.  Talk to your doctor before taking any cough or allergy medicines. You may need to avoid medicines that cause your lungs to be dry. Lifestyle  If you smoke, stop. Smoking makes the problem worse. If you need help quitting, ask your doctor.  Avoid being around things that make your breathing worse. This may include smoke, chemicals, and fumes.  Stay active, but remember to also rest.  Learn and use tips on how to relax.  Make sure you get enough sleep. Most adults need at least 7 hours a night.  Eat healthy foods. Eat smaller meals more often. Rest before meals. Controlled breathing  Learn and use tips on how to control your breathing as told by your doctor. Try: ? Breathing in (inhaling) through your nose for 1 second. Then, pucker your lips and breath out (exhale) through your lips for 2 seconds. ? Putting one hand on your belly (abdomen). Breathe in slowly through your nose for 1 second. Your hand on your belly should move out. Pucker your lips and breathe out slowly through your lips. Your hand on your belly should move in as you breathe out. Controlled coughing  Learn and use controlled coughing to clear mucus from your lungs. The steps are: 1. Lean your head a little forward. 2. Breathe in deeply. 3. Try to hold your breath for 3 seconds. 4. Keep your mouth slightly open while coughing 2 times. 5. Spit any mucus out into a tissue. 6. Rest and do the steps again 1 or 2 times as needed. General instructions  Make sure you get all the shots (vaccines) that your doctor recommends. Ask your doctor about a flu shot and a pneumonia shot.  Use oxygen therapy and therapy to help improve your lungs (pulmonary rehabilitation) if told by your doctor. If you need home oxygen therapy, ask your doctor if you should buy a tool to measure your oxygen level (oximeter).  Make a COPD  action plan with your doctor. This helps you know what to do if you feel worse than usual.  Manage any other conditions you have as told by your doctor.  Avoid going outside when it is very hot, cold, or humid.  Avoid people who have a sickness you can catch (contagious).  Keep all follow-up visits as told by your doctor. This is important. Contact a doctor if:  You cough up more mucus than usual.  There is a change in the color or thickness of the mucus.  It is harder to breathe than usual.  Your breathing is faster than usual.  You have trouble sleeping.  You need to use your medicines more often than usual.  You have trouble doing your normal activities such as getting dressed or walking around the house. Get help right away if:  You have shortness of breath while resting.  You have shortness of breath that stops you from: ?  Being able to talk. ? Doing normal activities.  Your chest hurts for longer than 5 minutes.  Your skin color is more blue than usual.  Your pulse oximeter shows that you have low oxygen for longer than 5 minutes.  You have a fever.  You feel too tired to breathe normally. Summary  Chronic obstructive pulmonary disease (COPD) is a long-term lung problem.  The way your lungs work will never return to normal. Usually the condition gets worse over time. There are things you can do to keep yourself as healthy as possible.  Take over-the-counter and prescription medicines only as told by your doctor.  If you smoke, stop. Smoking makes the problem worse. This information is not intended to replace advice given to you by your health care provider. Make sure you discuss any questions you have with your health care provider. Document Released: 04/01/2008 Document Revised: 03/21/2016 Document Reviewed: 06/10/2013 Elsevier Interactive Patient Education  2017 [employer].

## 2018-09-21 NOTE — Addendum Note (Signed)
Addended by: Jac CanavanYSINGER, DAVID S on: 09/21/2018 01:54 PM   Modules accepted: Orders

## 2018-09-21 NOTE — Progress Notes (Signed)
Subjective: Chief Complaint  Patient presents with  . NP    get re-established COPD   Here to reestablish care.  Last visit here was 2015.  Accompanied by his sister Sherrie.  Has recently filed for social security disability.  He has a history significant for atherosclerosis, emphysema, tobacco use heavy, hyperlipidemia, hypertension, femoral and carotid bruits, noncompliance, erectile dysfunction.  Of note, the last time I saw him back in 2014 he was restarted on Lipitor, aspirin, lisinopril and referred to vascular surgery for consult as well as cardiology.  He saw Dr. Lynelle Doctor in 2015 was advised to return for PFTs and further evaluation of COPD but never returned.  I reviewed Dr. Kristian Covey him's notes from 2016 June visit where he had claudication symptoms as well as abnormal ABIs, was advised to have angiography and never followed up.  Here today c/o SOB, dyspnea, decreased stamina with work and activity.  Worse in past year or 2.  Sister notes worsening last 3-4 years.   Smoking 3 ppd.  He has never tried medication to stop tobacco.   Past Medical History:  Diagnosis Date  . COPD (chronic obstructive pulmonary disease) (HCC)   . Erectile dysfunction   . Full dentures   . GERD (gastroesophageal reflux disease)   . Hyperlipidemia   . Tobacco use disorder    No current medications  ROS as in subjective    Objective: BP (!) 142/90   Pulse 74   Temp 97.8 F (36.6 C) (Oral)   Resp 16   Ht 5\' 9"  (1.753 m)   Wt 133 lb 3.2 oz (60.4 kg)   SpO2 99%   BMI 19.67 kg/m   General: Lean white male, strong tobacco odor in the room, no acute distress Lungs with decreased breath sounds in general, no wheezing no rhonchi Heart: RRR, normal S1, S2, no murmurs Carotid bruits heard bilaterally Decreased pulses in right arm, seemingly normal pulse left arm, decreased pedal pulses bilaterally HEENT unremarkable    Adult ECG Report  Indication: SOB, DOE  Rate: 58 bpm  Rhythm: sinus  bradycardia  QRS Axis: 73 degrees  PR Interval:  QRS Duration: 88ms  QTc:  Conduction Disturbances: none  Other Abnormalities: P wave enlargement, Q in III  Patient's cardiac risk factors are: advanced age (older than 34 for men, 71 for women), male gender and smoking/ tobacco exposure.  EKG comparison: 2014  Narrative Interpretation: no acute changes      Assessment: Encounter Diagnoses  Name Primary?  . Centrilobular emphysema (HCC) Yes  . Carotid artery disease, unspecified laterality, unspecified type (HCC)   . Atherosclerosis   . Essential hypertension, benign   . Hyperlipidemia, unspecified hyperlipidemia type   . Tobacco use disorder   . Bilateral carotid bruits   . Peripheral vascular disease (HCC)      Plan: I reviewed his chart history from the last visit here in 2014 and 2015, as well as the vascular surgery consult from several years ago.  He continues to smoke 3 packs/day, has several known diagnoses above.  We discussed the need to quit tobacco to work on a healthy lifestyle including diet.  Counseled on diet, making significant lifestyle changes, and working to quit smoking.  Advised counseling either through 1 800 quit now or other counseling to quit smoking.  Discussed medications including Chantix, Wellbutrin, or nicotine replacement medication.  He is agreeable to trial of nicotine patch.  He declines wellbutrin and chantix.  PFT abnormal today.  Advised  pulmonology and cardiology consults.   He will consider and let me know.  Recommendations:  Begin daily prevention inhaler Anoro to open up air sacs in the lungs and give you improvement in breathing  You may use Albuterol rescue inhaler 2 puffs every 6 hours as needed for wheezing, coughing fits, shortness of breath  STOP SMOKING!!!  Begin Nicotine patch daily to help reduce urge for tobacco.   Call 1-800-QUIT-NOW for counseling to stop smoking or consider local counselor or  hypnotist  After 1 week, begin back on Lipitor cholesterol medication once daily to lower cholesterol and lower heart disease risk  After 1 more week, begin back on Lisinopril daily for blood pressure  Call if medication is too expensive  Call if agreeable to referral to either lung doctor or heart doctor as discussed  Follow up in 1 month   Harvie HeckRandy was seen today for np.  Diagnoses and all orders for this visit:  Centrilobular emphysema (HCC) -     Spirometry with Graph  Carotid artery disease, unspecified laterality, unspecified type (HCC) -     EKG 12-Lead  Atherosclerosis -     EKG 12-Lead  Essential hypertension, benign -     lisinopril (PRINIVIL,ZESTRIL) 10 MG tablet; Take 1 tablet (10 mg total) by mouth daily.  Hyperlipidemia, unspecified hyperlipidemia type  Tobacco use disorder -     Spirometry with Graph  Bilateral carotid bruits  Peripheral vascular disease (HCC)  Other orders -     atorvastatin (LIPITOR) 40 MG tablet; Take 1 tablet (40 mg total) by mouth daily. -     umeclidinium-vilanterol (ANORO ELLIPTA) 62.5-25 MCG/INH AEPB; Inhale 1 puff into the lungs daily. -     albuterol (PROAIR HFA) 108 (90 Base) MCG/ACT inhaler; Inhale 2 puffs into the lungs 3 (three) times daily. -     nicotine (NICODERM CQ - DOSED IN MG/24 HOURS) 21 mg/24hr patch; Place 1 patch (21 mg total) onto the skin daily.

## 2018-09-28 ENCOUNTER — Telehealth: Payer: Self-pay | Admitting: Medical

## 2018-09-28 NOTE — Telephone Encounter (Signed)
  Please call, Sherrie (sister) she has questions about his referrals

## 2018-09-29 NOTE — Telephone Encounter (Signed)
Spoke with patient's sister and told her that the patient refused to make appointment with [facility].  She states that pulmonology with take a while to schedule appointment.   She states that if he cant get in by the end of year he looses his insurance and will not be able to afford to go.   I advised her to try to get him Medicaid.

## 2018-10-13 ENCOUNTER — Telehealth: Payer: Self-pay | Admitting: Medical

## 2018-10-13 NOTE — Telephone Encounter (Signed)
His overnight oxygen test shows that he does desaturate enough to require oxygen.  If agreeable please refer to [facility] for oxygen therapy at nighttime only, starting out at 1 L/min.  He should have a follow-up scheduled soon as well from his last visit

## 2018-10-14 NOTE — Telephone Encounter (Signed)
lmom for patient to call back for recommendations from shane

## 2018-10-14 NOTE — Telephone Encounter (Signed)
Oralia ManisSherri Beeson patient's sister called back for results.  She is on his HIPAA.  I gave her the results, she wanted to know what the overnight sleep study said.  I could not find it.  Please call Sherri [phone] with that info

## 2018-10-16 NOTE — Telephone Encounter (Signed)
See other messages.   I assume the overnight oxygen report is in the SCAN pile up front.  I have already signed on on this and my message below refers to request for overnight oxygen

## 2018-10-20 ENCOUNTER — Encounter: Payer: Self-pay | Admitting: Medical

## 2018-10-20 NOTE — Telephone Encounter (Signed)
Called [facility] and lvm with call center to have someone call back Thursday about O2.  KH

## 2018-10-23 ENCOUNTER — Telehealth: Payer: Self-pay | Admitting: Internal Medicine

## 2018-10-23 NOTE — Telephone Encounter (Signed)
Sister called and cancelled pt's CPE on 12/31. She states that pt will not have insurance at beginning of the year and is non compliant. I advised her, he needed a follow-up appt and that I couldn't guarantee that we would keep filling his medicines if he didn't come in for an appt. She would tell him that and explain to him why he needed to come in.

## 2018-10-27 ENCOUNTER — Encounter: Payer: BLUE CROSS/BLUE SHIELD | Admitting: Medical

## 2018-11-06 NOTE — Telephone Encounter (Signed)
Status?

## 2018-11-09 NOTE — Telephone Encounter (Signed)
Send completed form Tuesday

## 2018-11-09 NOTE — Telephone Encounter (Signed)
Called [facility] they have no information on patient.  I have a [employer] form for you to fill out so I can fax order.

## 2018-11-11 NOTE — Telephone Encounter (Signed)
done

## 2018-12-13 ENCOUNTER — Other Ambulatory Visit: Payer: Self-pay | Admitting: Medical

## 2018-12-13 DIAGNOSIS — I10 Essential (primary) hypertension: Secondary | ICD-10-CM

## 2021-03-27 ENCOUNTER — Inpatient Hospital Stay (HOSPITAL_COMMUNITY)
Admission: EM | Admit: 2021-03-27 | Discharge: 2021-04-01 | DRG: 308 | Disposition: A | Discharge status: Home / Self Care | Payer: BLUE CROSS/BLUE SHIELD | Attending: Internal Medicine | Admitting: Internal Medicine

## 2021-03-27 ENCOUNTER — Other Ambulatory Visit: Payer: Self-pay

## 2021-03-27 ENCOUNTER — Emergency Department (HOSPITAL_COMMUNITY): Payer: BLUE CROSS/BLUE SHIELD

## 2021-03-27 DIAGNOSIS — K219 Gastro-esophageal reflux disease without esophagitis: Secondary | ICD-10-CM | POA: Diagnosis present

## 2021-03-27 DIAGNOSIS — I10 Essential (primary) hypertension: Secondary | ICD-10-CM | POA: Diagnosis present

## 2021-03-27 DIAGNOSIS — J441 Chronic obstructive pulmonary disease with (acute) exacerbation: Secondary | ICD-10-CM

## 2021-03-27 DIAGNOSIS — Z8249 Family history of ischemic heart disease and other diseases of the circulatory system: Secondary | ICD-10-CM

## 2021-03-27 DIAGNOSIS — I480 Paroxysmal atrial fibrillation: Secondary | ICD-10-CM | POA: Diagnosis not present

## 2021-03-27 DIAGNOSIS — E785 Hyperlipidemia, unspecified: Secondary | ICD-10-CM | POA: Diagnosis present

## 2021-03-27 DIAGNOSIS — J439 Emphysema, unspecified: Secondary | ICD-10-CM | POA: Diagnosis present

## 2021-03-27 DIAGNOSIS — I081 Rheumatic disorders of both mitral and tricuspid valves: Secondary | ICD-10-CM | POA: Diagnosis present

## 2021-03-27 DIAGNOSIS — R739 Hyperglycemia, unspecified: Secondary | ICD-10-CM | POA: Diagnosis present

## 2021-03-27 DIAGNOSIS — I739 Peripheral vascular disease, unspecified: Secondary | ICD-10-CM | POA: Diagnosis present

## 2021-03-27 DIAGNOSIS — Z79899 Other long term (current) drug therapy: Secondary | ICD-10-CM

## 2021-03-27 DIAGNOSIS — Z7951 Long term (current) use of inhaled steroids: Secondary | ICD-10-CM

## 2021-03-27 DIAGNOSIS — F17210 Nicotine dependence, cigarettes, uncomplicated: Secondary | ICD-10-CM | POA: Diagnosis present

## 2021-03-27 DIAGNOSIS — Z28310 Unvaccinated for covid-19: Secondary | ICD-10-CM

## 2021-03-27 DIAGNOSIS — Z7982 Long term (current) use of aspirin: Secondary | ICD-10-CM

## 2021-03-27 DIAGNOSIS — I4891 Unspecified atrial fibrillation: Secondary | ICD-10-CM | POA: Diagnosis present

## 2021-03-27 DIAGNOSIS — U071 COVID-19: Secondary | ICD-10-CM | POA: Diagnosis present

## 2021-03-27 LAB — CBC WITH DIFFERENTIAL/PLATELET
Abs Immature Granulocytes: 0.03 10*3/uL (ref 0.00–0.07)
Basophils Absolute: 0 10*3/uL (ref 0.0–0.1)
Basophils Relative: 0 %
Eosinophils Absolute: 0 10*3/uL (ref 0.0–0.5)
Eosinophils Relative: 0 %
HCT: 50.3 % (ref 39.0–52.0)
Hemoglobin: 16.6 g/dL (ref 13.0–17.0)
Immature Granulocytes: 0 %
Lymphocytes Relative: 12 %
Lymphs Abs: 1.1 10*3/uL (ref 0.7–4.0)
MCH: 31.4 pg (ref 26.0–34.0)
MCHC: 33 g/dL (ref 30.0–36.0)
MCV: 95.3 fL (ref 80.0–100.0)
Monocytes Absolute: 0.9 10*3/uL (ref 0.1–1.0)
Monocytes Relative: 10 %
Neutro Abs: 7.3 10*3/uL (ref 1.7–7.7)
Neutrophils Relative %: 78 %
Platelets: 180 10*3/uL (ref 150–400)
RBC: 5.28 MIL/uL (ref 4.22–5.81)
RDW: 13.5 % (ref 11.5–15.5)
WBC: 9.3 10*3/uL (ref 4.0–10.5)
nRBC: 0 % (ref 0.0–0.2)

## 2021-03-27 LAB — BASIC METABOLIC PANEL
Anion gap: 8 (ref 5–15)
BUN: 13 mg/dL (ref 8–23)
CO2: 29 mmol/L (ref 22–32)
Calcium: 9.4 mg/dL (ref 8.9–10.3)
Chloride: 96 mmol/L — ABNORMAL LOW (ref 98–111)
Creatinine, Ser: 0.78 mg/dL (ref 0.61–1.24)
GFR, Estimated: >60 mL/min (ref >60)
Glucose, Bld: 100 mg/dL — ABNORMAL HIGH (ref 70–99)
Potassium: 4.9 mmol/L (ref 3.5–5.1)
Sodium: 133 mmol/L — ABNORMAL LOW (ref 135–145)

## 2021-03-27 NOTE — ED Provider Notes (Signed)
Emergency Medicine Provider Triage Evaluation Note  Manuel Richardson , a 65 y.o. male  was evaluated in triage.  Pt complains of ongoing shortness of breath after being diagnosed with COVID last week. Pt is unvaccinated. Began feeling ill after going to the beach with family. Tested positive and is currently on a zpack and prednisone. Reports no improvement in sx prompting ED visit.  Review of Systems  Positive: + SOB, cough Negative: - chest pain  Physical Exam  BP (!) 170/125 (BP Location: Right Arm)   Pulse 95   Temp 98 F (36.7 C) (Oral)   Resp (!) 22   SpO2 92%  Gen:   Awake, no distress   Resp:  On 2L O2 at this time. Able to speak in full sentences. Mild tachypnea.  MSK:   Moves extremities without difficulty  Other:    Medical Decision Making  Medically screening exam initiated at 3:57 PM.  Appropriate orders placed.  Manuel Richardson was informed that the remainder of the evaluation will be completed by another provider, this initial triage assessment does not replace that evaluation, and the importance of remaining in the ED until their evaluation is complete.  Per nursing staff pt placed on oxygen in the waiting room. Documented sat of 92% on arrival. Unsure if this is before or after the oxygen. Not typically on O2. Will need further eval.    Tanda Rockers, PA-C 03/27/21 1559    Jacalyn Lefevre, MD 03/28/21 [phone]

## 2021-03-27 NOTE — ED Triage Notes (Signed)
Pt diagnosed covid+ May 28, c/o increased shortness of breath with exertion, denies chest pain. Currently taking prednisone and azithromycin.

## 2021-03-28 ENCOUNTER — Other Ambulatory Visit (HOSPITAL_COMMUNITY): Payer: Self-pay

## 2021-03-28 ENCOUNTER — Observation Stay (HOSPITAL_BASED_OUTPATIENT_CLINIC_OR_DEPARTMENT_OTHER): Payer: BLUE CROSS/BLUE SHIELD

## 2021-03-28 DIAGNOSIS — J432 Centrilobular emphysema: Secondary | ICD-10-CM | POA: Diagnosis not present

## 2021-03-28 DIAGNOSIS — I10 Essential (primary) hypertension: Secondary | ICD-10-CM

## 2021-03-28 DIAGNOSIS — I4891 Unspecified atrial fibrillation: Secondary | ICD-10-CM | POA: Diagnosis not present

## 2021-03-28 DIAGNOSIS — U071 COVID-19: Secondary | ICD-10-CM | POA: Diagnosis not present

## 2021-03-28 LAB — ECHOCARDIOGRAM COMPLETE
Area-P 1/2: 3.81 cm2
S' Lateral: 3.1 cm

## 2021-03-28 LAB — PROCALCITONIN: Procalcitonin: <0.1 ng/mL

## 2021-03-28 LAB — TSH: TSH: 3.258 u[IU]/mL (ref 0.350–4.500)

## 2021-03-28 LAB — HEPARIN LEVEL (UNFRACTIONATED)
Heparin Unfractionated: <0.1 IU/mL — ABNORMAL LOW (ref 0.30–0.70)
Heparin Unfractionated: 0.11 IU/mL — ABNORMAL LOW (ref 0.30–0.70)

## 2021-03-28 LAB — RESP PANEL BY RT-PCR (FLU A&B, COVID) ARPGX2
Influenza A by PCR: NEGATIVE
Influenza B by PCR: NEGATIVE
SARS Coronavirus 2 by RT PCR: POSITIVE — AB

## 2021-03-28 LAB — D-DIMER, QUANTITATIVE: D-Dimer, Quant: 1 ug/mL-FEU — ABNORMAL HIGH (ref 0.00–0.50)

## 2021-03-28 LAB — HIV ANTIBODY (ROUTINE TESTING W REFLEX): HIV Screen 4th Generation wRfx: NONREACTIVE

## 2021-03-28 LAB — C-REACTIVE PROTEIN: CRP: 1.1 mg/dL — ABNORMAL HIGH (ref <1.0)

## 2021-03-28 MED ORDER — PREDNISONE 20 MG PO TABS
40.0000 mg | ORAL_TABLET | Freq: Every day | ORAL | Status: DC
  Filled 2021-03-28: qty 2

## 2021-03-28 MED ORDER — HEPARIN BOLUS VIA INFUSION
1500.0000 [IU] | Freq: Once | INTRAVENOUS | Status: AC
  Administered 2021-03-28: 1500 [IU] via INTRAVENOUS
  Filled 2021-03-28: qty 1500

## 2021-03-28 MED ORDER — PANTOPRAZOLE SODIUM 40 MG PO TBEC
40.0000 mg | DELAYED_RELEASE_TABLET | Freq: Every day | ORAL | Status: DC
  Administered 2021-03-28 – 2021-04-01 (×4): 40 mg via ORAL
  Filled 2021-03-28 (×5): qty 1

## 2021-03-28 MED ORDER — PREDNISONE 20 MG PO TABS
60.0000 mg | ORAL_TABLET | Freq: Every day | ORAL | Status: AC
  Administered 2021-03-28: 60 mg via ORAL
  Filled 2021-03-28: qty 3

## 2021-03-28 MED ORDER — PREDNISONE 10 MG PO TABS: 10.0000 mg | ORAL_TABLET | Freq: Every day | ORAL | Status: DC

## 2021-03-28 MED ORDER — SODIUM CHLORIDE 0.9 % IV SOLN: 2.0000 g | Freq: Once | INTRAVENOUS | Status: DC

## 2021-03-28 MED ORDER — HEPARIN BOLUS VIA INFUSION
3000.0000 [IU] | Freq: Once | INTRAVENOUS | Status: AC
  Administered 2021-03-28: 3000 [IU] via INTRAVENOUS
  Filled 2021-03-28: qty 3000

## 2021-03-28 MED ORDER — BUDESON-GLYCOPYRROL-FORMOTEROL 160-9-4.8 MCG/ACT IN AERO: 2.0000 | INHALATION_SPRAY | Freq: Two times a day (BID) | RESPIRATORY_TRACT | Status: DC

## 2021-03-28 MED ORDER — DILTIAZEM LOAD VIA INFUSION
10.0000 mg | Freq: Once | INTRAVENOUS | Status: AC
  Administered 2021-03-28: 10 mg via INTRAVENOUS
  Filled 2021-03-28: qty 10

## 2021-03-28 MED ORDER — PREDNISONE 20 MG PO TABS: 30.0000 mg | ORAL_TABLET | Freq: Every day | ORAL | Status: DC

## 2021-03-28 MED ORDER — HEPARIN (PORCINE) 25000 UT/250ML-% IV SOLN
1200.0000 [IU]/h | INTRAVENOUS | Status: DC
  Administered 2021-03-28: 850 [IU]/h via INTRAVENOUS
  Administered 2021-03-29: 1200 [IU]/h via INTRAVENOUS
  Filled 2021-03-28 (×2): qty 250

## 2021-03-28 MED ORDER — NICOTINE 21 MG/24HR TD PT24
21.0000 mg | MEDICATED_PATCH | Freq: Every day | TRANSDERMAL | Status: DC
  Administered 2021-03-28 – 2021-04-01 (×5): 21 mg via TRANSDERMAL
  Filled 2021-03-28 (×5): qty 1

## 2021-03-28 MED ORDER — PREDNISONE 20 MG PO TABS
50.0000 mg | ORAL_TABLET | Freq: Every day | ORAL | Status: AC
  Administered 2021-03-29: 50 mg via ORAL
  Filled 2021-03-28: qty 2

## 2021-03-28 MED ORDER — PREDNISONE 10 MG (21) PO TBPK: 10.0000 mg | ORAL_TABLET | ORAL | Status: DC

## 2021-03-28 MED ORDER — DILTIAZEM HCL-DEXTROSE 125-5 MG/125ML-% IV SOLN (PREMIX)
5.0000 mg/h | INTRAVENOUS | Status: DC
  Administered 2021-03-28: 5 mg/h via INTRAVENOUS
  Administered 2021-03-28: 15 mg/h via INTRAVENOUS
  Administered 2021-03-28: 5 mg/h via INTRAVENOUS
  Administered 2021-03-29: 15 mg/h via INTRAVENOUS
  Filled 2021-03-28 (×5): qty 125

## 2021-03-28 MED ORDER — FLUTICASONE FUROATE-VILANTEROL 100-25 MCG/INH IN AEPB
1.0000 | INHALATION_SPRAY | Freq: Every day | RESPIRATORY_TRACT | Status: DC
  Administered 2021-03-30 – 2021-04-01 (×2): 1 via RESPIRATORY_TRACT
  Filled 2021-03-28: qty 28

## 2021-03-28 MED ORDER — VANCOMYCIN HCL 1000 MG/200ML IV SOLN
1000.0000 mg | Freq: Once | INTRAVENOUS | Status: DC
  Filled 2021-03-28: qty 200

## 2021-03-28 MED ORDER — IPRATROPIUM BROMIDE HFA 17 MCG/ACT IN AERS
2.0000 | INHALATION_SPRAY | Freq: Once | RESPIRATORY_TRACT | Status: AC
  Administered 2021-03-28: 2 via RESPIRATORY_TRACT
  Filled 2021-03-28: qty 12.9

## 2021-03-28 MED ORDER — ASPIRIN EC 81 MG PO TBEC
81.0000 mg | DELAYED_RELEASE_TABLET | Freq: Every day | ORAL | Status: DC
  Administered 2021-03-28 – 2021-04-01 (×5): 81 mg via ORAL
  Filled 2021-03-28 (×5): qty 1

## 2021-03-28 MED ORDER — ACETAMINOPHEN 325 MG PO TABS
650.0000 mg | ORAL_TABLET | Freq: Four times a day (QID) | ORAL | Status: DC | PRN
  Administered 2021-03-28: 650 mg via ORAL
  Filled 2021-03-28: qty 2

## 2021-03-28 MED ORDER — ATORVASTATIN CALCIUM 80 MG PO TABS
80.0000 mg | ORAL_TABLET | Freq: Every day | ORAL | Status: DC
  Administered 2021-03-29 – 2021-04-01 (×4): 80 mg via ORAL
  Filled 2021-03-28 (×4): qty 1

## 2021-03-28 MED ORDER — SODIUM CHLORIDE 0.9 % IV BOLUS
1000.0000 mL | Freq: Once | INTRAVENOUS | Status: AC
Start: 2021-03-28 — End: 2021-03-28
  Administered 2021-03-28: 1000 mL via INTRAVENOUS

## 2021-03-28 MED ORDER — ATORVASTATIN CALCIUM 40 MG PO TABS
40.0000 mg | ORAL_TABLET | Freq: Every day | ORAL | Status: DC
  Administered 2021-03-28: 40 mg via ORAL
  Filled 2021-03-28: qty 1

## 2021-03-28 MED ORDER — HYDROCODONE BIT-HOMATROP MBR 5-1.5 MG/5ML PO SOLN
5.0000 mL | Freq: Four times a day (QID) | ORAL | Status: DC | PRN
  Administered 2021-03-28: 5 mL via ORAL
  Filled 2021-03-28: qty 5

## 2021-03-28 MED ORDER — METOPROLOL TARTRATE 12.5 MG HALF TABLET
12.5000 mg | ORAL_TABLET | Freq: Two times a day (BID) | ORAL | Status: DC
  Administered 2021-03-28 – 2021-03-29 (×3): 12.5 mg via ORAL
  Filled 2021-03-28 (×3): qty 1

## 2021-03-28 MED ORDER — ALBUTEROL SULFATE HFA 108 (90 BASE) MCG/ACT IN AERS
2.0000 | INHALATION_SPRAY | Freq: Four times a day (QID) | RESPIRATORY_TRACT | Status: DC | PRN
  Administered 2021-03-29 – 2021-04-01 (×2): 2 via RESPIRATORY_TRACT

## 2021-03-28 MED ORDER — AEROCHAMBER PLUS FLO-VU LARGE MISC: 1.0000 | Freq: Once | Status: AC

## 2021-03-28 MED ORDER — AEROCHAMBER PLUS FLO-VU LARGE MISC
Status: AC
  Administered 2021-03-28: 1
  Filled 2021-03-28: qty 1

## 2021-03-28 MED ORDER — BEBTELOVIMAB 175 MG/2 ML IV (EUA)
175.0000 mg | Freq: Once | INTRAMUSCULAR | Status: AC
  Administered 2021-03-28: 175 mg via INTRAVENOUS
  Filled 2021-03-28: qty 2

## 2021-03-28 MED ORDER — ONDANSETRON HCL 4 MG/2ML IJ SOLN: 4.0000 mg | Freq: Four times a day (QID) | INTRAMUSCULAR | Status: DC | PRN

## 2021-03-28 MED ORDER — ONDANSETRON HCL 4 MG PO TABS
4.0000 mg | ORAL_TABLET | Freq: Four times a day (QID) | ORAL | Status: DC | PRN
Start: 2021-03-28 — End: 2021-04-01

## 2021-03-28 MED ORDER — PREDNISONE 20 MG PO TABS: 20.0000 mg | ORAL_TABLET | Freq: Every day | ORAL | Status: DC

## 2021-03-28 MED ORDER — UMECLIDINIUM BROMIDE 62.5 MCG/INH IN AEPB
1.0000 | INHALATION_SPRAY | Freq: Every day | RESPIRATORY_TRACT | Status: DC
  Administered 2021-03-30 – 2021-04-01 (×2): 1 via RESPIRATORY_TRACT
  Filled 2021-03-28: qty 7

## 2021-03-28 MED ORDER — SODIUM CHLORIDE 0.9 % IV BOLUS
1000.0000 mL | Freq: Once | INTRAVENOUS | Status: AC
  Administered 2021-03-28: 1000 mL via INTRAVENOUS

## 2021-03-28 MED ORDER — ALBUTEROL SULFATE HFA 108 (90 BASE) MCG/ACT IN AERS
8.0000 | INHALATION_SPRAY | Freq: Once | RESPIRATORY_TRACT | Status: AC
  Administered 2021-03-28: 8 via RESPIRATORY_TRACT
  Filled 2021-03-28: qty 6.7

## 2021-03-28 NOTE — Progress Notes (Signed)
PROGRESS NOTE    Manuel Richardson  EHU:[id number] DOB: 1956/01/21 DOA: 03/27/2021 PCP: Charlane Ferretti, DO   Chief Complaint  Patient presents with  . Shortness of Breath    Brief Narrative:   This is a no charge note as patient was seen and admitted earlier today by Dr. Julian Reil, chart, imaging, labs were reviewed, patient was seen and examined.  HPI: Manuel Richardson is a 65 y.o. male with medical history significant of COPD with emphysema, ongoing smoking, HTN.  Pt unvaccinated to COVID.  Pt went to beach with family last week.  Came back, started feeling ill.  Diagnosed with COVID this past week and started on prednisone taper and Z-pak as outpt.  Symptoms of moderate SOB and cough have persisted so presents to ED this evening.  No CP.   ED Course: [place] 91% on RA, 94% on 2L via [place].  CXR neg.  EDP gave MAB therapy as well as neb treatments.  After neb treatments pt developed new onset A.Fib RVR with rate of 200 initially.  Pt given cardizem bolus and gtt, rate now 160s.  Assessment & Plan:   Principal Problem:   Atrial fibrillation with RVR (HCC) Active Problems:   Essential hypertension, benign   COPD with emphysema (HCC)   COVID-19 virus infection    New onset A.Fib RVR  -[place], cardiology input greatly appreciated, continue with IV Cardizem, heart rate remains poorly controlled to 15 mg/hour and drip, started on low-dose metoprolol. -Follow on 2D - follow on TSH -On heparin GTT for anticoagulation, will transition to Eliquis before discharge -Per cardiology if he does not convert, then plan cardioversion once he recovers from COVID  Tobacco abuse -He was counseled  COVID-19 infection -He received monoclonal therapy in ED -Continue with steroids in the setting of COPD  HTN  -He is currently on Cardizem and metoprolol    DVT prophylaxis: Heparin GTT Code Status: Full Family Communication: None at bedside Disposition:   Status is:  Observation  The patient will require care spanning > 2 midnights and should be moved to inpatient because: IV treatments appropriate due to intensity of illness or inability to take PO  Dispo: The patient is from: Home              Anticipated d/c is to: Home              Patient currently is not medically stable to d/c.  He remains on oxygen and Cardizem drip   Difficult to place patient No       Consultants:   Cardiology   Subjective: He does report some dyspnea, cough, denies any chest pain  Objective: Vitals:   03/28/21 1215 03/28/21 1245 03/28/21 1300 03/28/21 1315  BP: 116/77 120/80 105/78 121/73  Pulse: (!) 46 (!) 105 (!) 128 61  Resp: 16 20 16  (!) 21  Temp:      TempSrc:      SpO2: 92% 91% 90% 92%    Intake/Output Summary (Last 24 hours) at 03/28/2021 1341 Last data filed at 03/28/2021 1239 Gross per 24 hour  Intake 2113.63 ml  Output --  Net 2113.63 ml   There were no vitals filed for this visit.  Examination:  General exam: Appears calm and comfortable  Respiratory system: Clear to auscultation. Respiratory effort normal. Cardiovascular system: S1 & S2 heard, irregular irregular. No JVD, No pedal edema. Gastrointestinal system: Abdomen is nondistended, soft and nontender. No organomegaly or masses felt. Normal bowel sounds heard.  Central nervous system: Alert and oriented. No focal neurological deficits. Extremities: Symmetric 5 x 5 power.    Data Reviewed: I have personally reviewed following labs and imaging studies  CBC: Recent Labs  Lab 03/27/21 1558  WBC 9.3  NEUTROABS 7.3  HGB 16.6  HCT 50.3  MCV 95.3  PLT 180    Basic Metabolic Panel: Recent Labs  Lab 03/27/21 1558  NA 133*  K 4.9  CL 96*  CO2 29  GLUCOSE 100*  BUN 13  CREATININE 0.78  CALCIUM 9.4    GFR: CrCl cannot be calculated (Unknown ideal weight.).  Liver Function Tests: No results for input(s): AST, ALT, ALKPHOS, BILITOT, PROT, ALBUMIN in the last 168  hours.  CBG: No results for input(s): GLUCAP in the last 168 hours.   Recent Results (from the past 240 hour(s))  Resp Panel by RT-PCR (Flu A&B, Covid) Nasopharyngeal Swab     Status: Abnormal   Collection Time: 03/28/21  4:03 AM   Specimen: Nasopharyngeal Swab; Nasopharyngeal(NP) swabs in vial transport medium  Result Value Ref Range Status   SARS Coronavirus 2 by RT PCR POSITIVE (A) NEGATIVE Final    Comment: RESULT CALLED TO, READ BACK BY AND VERIFIED WITH: RN GRACE TATE BY MESSAN H. AT 2876 ON 03/28/2021 (NOTE) SARS-CoV-2 target nucleic acids are DETECTED.  The SARS-CoV-2 RNA is generally detectable in upper respiratory specimens during the acute phase of infection. Positive results are indicative of the presence of the identified virus, but do not rule out bacterial infection or co-infection with other pathogens not detected by the test. Clinical correlation with patient history and other diagnostic information is necessary to determine patient infection status. The expected result is Negative.  Fact Sheet for Patients: BloggerCourse.com  Fact Sheet for Healthcare Providers: SeriousBroker.it  This test is not yet approved or cleared by the [place] FDA and  has been authorized for detection and/or diagnosis of SARS-CoV-2 by FDA under an Emergency Use Authorization (EUA).  This EUA will remain in effect (meaning thi s test can be used) for the duration of  the COVID-19 declaration under Section 564(b)(1) of the Act, 21 U.S.C. section 360bbb-3(b)(1), unless the authorization is terminated or revoked sooner.     Influenza A by PCR NEGATIVE NEGATIVE Final   Influenza B by PCR NEGATIVE NEGATIVE Final    Comment: (NOTE) The Xpert Xpress SARS-CoV-2/FLU/RSV plus assay is intended as an aid in the diagnosis of influenza from Nasopharyngeal swab specimens and should not be used as a sole basis for treatment. Nasal washings  and aspirates are unacceptable for Xpert Xpress SARS-CoV-2/FLU/RSV testing.  Fact Sheet for Patients: BloggerCourse.com  Fact Sheet for Healthcare Providers: SeriousBroker.it  This test is not yet approved or cleared by the [place] FDA and has been authorized for detection and/or diagnosis of SARS-CoV-2 by FDA under an Emergency Use Authorization (EUA). This EUA will remain in effect (meaning this test can be used) for the duration of the COVID-19 declaration under Section 564(b)(1) of the Act, 21 U.S.C. section 360bbb-3(b)(1), unless the authorization is terminated or revoked.  Performed at [facility], [address] [place], [place] [address]          Radiology Studies: DG Chest Port 1 View  Result Date: 03/27/2021 CLINICAL DATA:  History of COVID-19 positivity with shortness of breath EXAM: PORTABLE CHEST 1 VIEW COMPARISON:  10/03/2010 FINDINGS: Cardiac shadow is within normal limits. Aortic calcifications are noted. The lungs are hyperinflated with mild bibasilar atelectatic  changes. No focal confluent infiltrate is seen. No sizable effusion is noted. No bony abnormality is seen. IMPRESSION: Mild bibasilar atelectasis. Changes of COPD. Electronically Signed   By: Alcide Clever M.D.   On: 03/27/2021 18:27        Scheduled Meds: . aspirin EC  81 mg Oral Daily  . [START ON 03/29/2021] atorvastatin  80 mg Oral Daily  . fluticasone furoate-vilanterol  1 puff Inhalation Daily   And  . umeclidinium bromide  1 puff Inhalation Daily  . metoprolol tartrate  12.5 mg Oral BID  . nicotine  21 mg Transdermal Daily  . [START ON 03/29/2021] predniSONE  50 mg Oral Q breakfast   Followed by  . [START ON 03/30/2021] predniSONE  40 mg Oral Q breakfast   Followed by  . [START ON 03/31/2021] predniSONE  30 mg Oral Q breakfast   Followed by  . [START ON 04/01/2021] predniSONE  20 mg Oral Q breakfast   Followed by  . [START ON  04/02/2021] predniSONE  10 mg Oral Q breakfast   Continuous Infusions: . diltiazem (CARDIZEM) infusion 5 mg/hr (03/28/21 1310)  . heparin 850 Units/hr (03/28/21 0509)     LOS: 0 days     Huey Bienenstock, MD [facility]   To contact the attending provider between 7A-7P or the covering provider during after hours 7P-7A, please log into the web site [email or web address] and access using universal [place] password for that web site. If you do not have the password, please call the hospital operator.  03/28/2021, 1:41 PM

## 2021-03-28 NOTE — Progress Notes (Signed)
ANTICOAGULATION CONSULT NOTE   Pharmacy Consult for heparin Indication: atrial fibrillation  No Known Allergies  Patient Measurements: Weight: 57 kg (125 lb 11.2 oz) Heparin Dosing Weight: ~60 kg  Vital Signs: Temp: 98 F (36.7 C) (06/01 2018) Temp Source: Oral (06/01 2018) BP: 113/71 (06/01 2018) Pulse Rate: 99 (06/01 2018)  Labs: Recent Labs    03/27/21 1558 03/28/21 1300 03/28/21 2106  HGB 16.6  --   --   HCT 50.3  --   --   PLT 180  --   --   HEPARINUNFRC  --  <0.10* 0.11*  CREATININE 0.78  --   --     CrCl cannot be calculated (Unknown ideal weight.).   Medical History: Past Medical History:  Diagnosis Date  . COPD (chronic obstructive pulmonary disease) (HCC)   . Erectile dysfunction   . Full dentures   . GERD (gastroesophageal reflux disease)   . Hyperlipidemia   . Tobacco use disorder     Assessment: 65 yo man on heparin for afib.  He was not on anticoagulation PTA. Plans noted for apixaban (cost is $40 per month)  Heparin level 0.11 on heparin 1050 units/hr is subtherapeutic. Per RN no issues with IV access or infusion. No bleeding noted.   Goal of Therapy:  Heparin level 0.3-0.7 units/ml Monitor platelets by anticoagulation protocol: Yes   Plan:  Heparin 1500 units x1 bolus  Increase heparin to 1200 units/hr Check 6 hr heparin level    Gerrit Halls, PharmD Clinical Pharmacist

## 2021-03-28 NOTE — TOC Benefit Eligibility Note (Signed)
Patient Product/process development scientist completed.    The patient is currently admitted and upon discharge could be taking Eliquis 5 mg.  The current 30 day co-pay is, $40.00.   The patient is currently admitted and upon discharge could be taking Xarelto 20 mg.  The current 30 day co-pay is, $40.00.   The patient is insured through [employer]    Roland Earl, CPhT Pharmacy Patient Advocate Specialist [facility] Antimicrobial Stewardship Team Direct Number: [phone]  Fax: [phone]

## 2021-03-28 NOTE — Consult Note (Addendum)
Cardiology Consultation:   Patient ID: Manuel Richardson MRN: [id number]; DOB: 1956-04-11  Admit date: 03/27/2021 Date of Consult: 03/28/2021  PCP:  Charlane Ferretti, DO   [facility] Providers Cardiologist:  None New   Dr Allyson Sabal, [facility] 2011  Patient Profile:   Manuel Richardson is a 65 y.o. male with a hx of PAD w/ L subclavian 80% s/p L carotid-subclavian bpg, and occluded R-ICA (2011), COPD, ongoing tobacco use, HLD, GERD, who is being seen 03/28/2021 for the evaluation of rapid atrial fib in the setting of Covid (unvaccinated), at the request of Dr Randol Kern.  History of Present Illness:   Manuel Richardson began experiencing COVID symptoms last week.  He was more short of breath and coughing.  Finally, on Saturday, he got tested and was positive.  He was put on prednisone, cough medicine and azithromycin.  However, his respiratory status continued to deteriorate and he finally came to the emergency room on 5/31.  His O2 saturation was 90% at rest on room air.  It improved with 2 L of oxygen.  As long as he is still, he feels his breathing is okay.  He has never had palpitations.  He never gets lightheaded or dizzy.  He was initially in sinus rhythm, but converted to rapid atrial fibrillation morning.  His heart rate was high as 202, but is in the 120s on IV Cardizem at 15 mg/h.   He is completely asymptomatic with this.  He has no history of palpitations.  He never gets lightheaded or dizzy.  He was not aware of any change in his condition from when he was admitted to when he went into atrial fib.  He has a pulse ox at home that he uses because of his COPD.  His heart rate is usually in the 90s, but generally does not vary that much.  He is not very active, has no history of chest pain with exertion.  He has not been dropping things.   Past Medical History:  Diagnosis Date  . COPD (chronic obstructive pulmonary disease) (HCC)   . Erectile dysfunction   . Full dentures   . GERD  (gastroesophageal reflux disease)   . Hyperlipidemia   . Tobacco use disorder     Past Surgical History:  Procedure Laterality Date  . carotid artery surgery  2012   stent in left side  . COLONOSCOPY  2007  . ELBOW SURGERY  teenager   left     Home Medications:  Prior to Admission medications   Medication Sig Start Date End Date Taking? Authorizing Provider  albuterol (PROAIR HFA) 108 (90 Base) MCG/ACT inhaler Inhale 2 puffs into the lungs 3 (three) times daily. Patient taking differently: Inhale 2 puffs into the lungs every 6 (six) hours as needed for wheezing or shortness of breath. 09/21/18  Yes Tysinger, Kermit Balo, PA-C  ASPIRIN LOW DOSE 81 MG EC tablet Take 81 mg by mouth daily. 01/09/21  Yes [provider]  atorvastatin (LIPITOR) 40 MG tablet TAKE 1 TABLET BY MOUTH EVERY DAY Patient taking differently: Take 40 mg by mouth daily. 12/14/18  Yes Tysinger, Kermit Balo, PA-C  BREZTRI AEROSPHERE 160-9-4.8 MCG/ACT AERO Inhale 2 puffs into the lungs 2 (two) times daily. 01/09/21  Yes [provider]  HYDROcodone bit-homatropine (HYCODAN) 5-1.5 MG/5ML syrup Take 5 mLs by mouth every 6 (six) hours as needed for cough.   Yes [provider]  predniSONE (STERAPRED UNI-PAK 21 TAB) 10 MG (21) TBPK tablet Take 10  mg by mouth See admin instructions. 6,5,4,3,2,1 03/24/21  Yes [provider]  lisinopril (PRINIVIL,ZESTRIL) 10 MG tablet TAKE 1 TABLET BY MOUTH EVERY DAY Patient not taking: No sig reported 12/14/18   Tysinger, Kermit Baloavid S, PA-C  nicotine (NICODERM CQ - DOSED IN MG/24 HOURS) 21 mg/24hr patch Place 1 patch (21 mg total) onto the skin daily. Patient not taking: No sig reported 09/21/18   Tysinger, Kermit Baloavid S, PA-C  sildenafil (VIAGRA) 50 MG tablet Take 1 tablet (50 mg total) by mouth as needed for erectile dysfunction. Patient not taking: No sig reported 11/06/12   Tysinger, Kermit Baloavid S, PA-C  umeclidinium-vilanterol (ANORO ELLIPTA) 62.5-25 MCG/INH AEPB Inhale 1 puff into  the lungs daily. Patient not taking: No sig reported 09/21/18   Jac Canavanysinger, David S, PA-C    Inpatient Medications: Scheduled Meds: . aspirin EC  81 mg Oral Daily  . atorvastatin  40 mg Oral Daily  . Budeson-Glycopyrrol-Formoterol  2 puff Inhalation BID  . nicotine  21 mg Transdermal Daily  . [START ON 03/29/2021] predniSONE  50 mg Oral Q breakfast   Followed by  . [START ON 03/30/2021] predniSONE  40 mg Oral Q breakfast   Followed by  . [START ON 03/31/2021] predniSONE  30 mg Oral Q breakfast   Followed by  . [START ON 04/01/2021] predniSONE  20 mg Oral Q breakfast   Followed by  . [START ON 04/02/2021] predniSONE  10 mg Oral Q breakfast   Continuous Infusions: . diltiazem (CARDIZEM) infusion 15 mg/hr (03/28/21 0826)  . heparin 850 Units/hr (03/28/21 0509)   PRN Meds: acetaminophen, albuterol, HYDROcodone bit-homatropine, ondansetron **OR** ondansetron (ZOFRAN) IV  Allergies:   No Known Allergies  Social History:   Social History   Socioeconomic History  . Marital status: Married    Spouse name: Not on file  . Number of children: Not on file  . Years of education: Not on file  . Highest education level: Not on file  Occupational History  . 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 Smoking status: Current Every Day Smoker    Packs/day: 3.00    Years: 38.00    Pack years: 114.00    Types: Cigarettes  . Smokeless tobacco: Never Used  . Tobacco comment: decreased to 1 PPD since illness last week (06/2014)  Substance and Sexual Activity  . Alcohol use: Yes    Alcohol/week: 12.0 standard drinks    Types: 12 Cans of beer per week  . Drug use: No  . Sexual activity: Not on file    Comment: no exercise.  Other Topics Concern  . Not on file  Social History Narrative   Mar[place], [place], exercise - walking on job   Social Determinants of Health   Financial Resource Strain: Not on file  Food Insecurity: Not  on file  Transportation Needs: Not on file  Physical Activity: Not on file  Stress: Not on file  Social Connections: Not on file  Intimate Partner Violence: Not on file    Family History:    Family History  Problem Relation Age of Onset  . Heart disease Mother        died 6958 with MI  . Cancer Father        lung cancer  . Hypertension Brother   . Diabetes Neg Hx   . Stroke Neg Hx    Family Status  Relation Name Status  .  Mother  Deceased at age 55  . Father  Deceased at age 58  . MGM  Deceased at age 66  . MGF  Deceased  . PGM  Deceased  . PGF  Deceased  . Brother [employer]  . Brother [employer]  . Sister [employer]  . Daughter [employer]  . Brother  (Not Specified)  . Neg Hx  (Not Specified)      ROS:  Please see the history of present illness.  All other ROS reviewed and negative.     Physical Exam/Data:   Vitals:   03/28/21 0741 03/28/21 0800 03/28/21 0815 03/28/21 0824  BP:  (!) 126/104 103/80   Pulse:  (!) 149 88 (!) 53  Resp:  (!) 22 (!) 21 (!) 27  Temp:      TempSrc:      SpO2: 94% 95% 95% 97%    Intake/Output Summary (Last 24 hours) at 03/28/2021 1008 Last data filed at 03/28/2021 0826 Gross per 24 hour  Intake 2052.08 ml  Output --  Net 2052.08 ml   Last 3 Weights 09/21/2018 04/10/2015 07/18/2014  Weight (lbs) 133 lb 3.2 oz 146 lb 139 lb  Weight (kg) 60.419 kg 66.225 kg 63.05 kg     There is no height or weight on file to calculate BMI.  General:  Well nourished, well developed, in no acute distress at rest on O2 HEENT: normal Lymph: no adenopathy Neck: no JVD Endocrine:  No thryomegaly Vascular: No carotid bruits; 4/4 extremity pulses 2+  Cardiac:  normal S1, S2; regular rate and rhythm; no murmur  Lungs: Diffusely decreased breath sounds bilaterally, no wheezing, rhonchi or rales  Abd: soft, nontender, no hepatomegaly  Ext: no edema Musculoskeletal:  No deformities, BUE and BLE strength normal and equal Skin: warm and dry  Neuro:   CNs 2-12 intact, no focal abnormalities noted Psych:  Normal affect   EKG:  The EKG was personally reviewed and demonstrates:  03/27/2021: Sinus rhythm, heart rate 74, no acute ischemic changes 03/28/2021: Atrial fibrillation, heart rate 202, repolarization abnormalities noted Telemetry:  Telemetry was personally reviewed and demonstrates: Sinus rhythm -> rapid atrial fibrillation, heart rate currently 120s  Relevant CV Studies:  ECHO: Ordered  LE ART DUPLEX: 04/10/2015 Abnormal monophasic flow present throughout the right lower extremity suggestive of a hemodynamically significant stenosis versus occlusion more proximal involving the iliac artery system.  The right lower extremity arterial system is patent to the ankle with mild diffuse plaque present  OP NOTE: 11/01/2010 Left common carotid artery to subclavian artery bypass graft, 7 mm Dacron  PV CATH: 10/09/2010 80% eccentric left subclavian stenosis just proximal to the takeoff of the left vertebral artery.  There was TIMI-3 flow with antegrade injection of the left subclavian artery. IMPRESSION: Manuel Richardson has symptomatic left subclavian artery stenosis.  His left subclavian is at least a 10 mm vessel with a large plaque burden in close proximity to the takeoff of the vertebral artery.  There could be some plaque shift and/or dissection involving the vertebral artery with stenting.  The films were reviewed with Dr. Myra Gianotti who will see the patient for left carotid subclavian bypass grafting.   Laboratory Data:  High Sensitivity Troponin:  No results for input(s): TROPONINIHS in the last 720 hours.   Chemistry Recent Labs  Lab 03/27/21 1558  NA 133*  K 4.9  CL 96*  CO2 29  GLUCOSE 100*  BUN 13  CREATININE 0.78  CALCIUM 9.4  GFRNONAA >60  ANIONGAP 8    No results for input(s): PROT, ALBUMIN, AST, ALT, ALKPHOS, BILITOT in the last 168 hours. Hematology Recent Labs  Lab 03/27/21 1558  WBC 9.3  RBC 5.28  HGB 16.6  HCT 50.3   MCV 95.3  MCH 31.4  MCHC 33.0  RDW 13.5  PLT 180   BNPNo results for input(s): BNP, PROBNP in the last 168 hours.  DDimer  Recent Labs  Lab 03/28/21 0443  DDIMER 1.00*     Radiology/Studies:  DG Chest Port 1 View  Result Date: 03/27/2021 CLINICAL DATA:  History of COVID-19 positivity with shortness of breath EXAM: PORTABLE CHEST 1 VIEW COMPARISON:  10/03/2010 FINDINGS: Cardiac shadow is within normal limits. Aortic calcifications are noted. The lungs are hyperinflated with mild bibasilar atelectatic changes. No focal confluent infiltrate is seen. No sizable effusion is noted. No bony abnormality is seen. IMPRESSION: Mild bibasilar atelectasis. Changes of COPD. Electronically Signed   By: Alcide Clever M.D.   On: 03/27/2021 18:27     Assessment and Plan:   1. Atrial fibrillation, RVR - He is getting some rate control with Cardizem, can increase this to 20 mg/hr if BP will allow - He has significant respiratory history, so we will avoid beta-blockers - Rate control is the best option for now due to his acute illness - Discuss with MD if we should try a short course of amiodarone. -He is a candidate for anticoagulation, no known bleeding issues and no history of falls. -However, until he turns 65, his CHA2DS2-VASc is 1 (for PAD) - Because he is asymptomatic, he could have been going in and out of atrial fibrillation for years and it was just not caught on his home pulse ox. -Currently on heparin >> Xarelto or Eliquis per MD  2. PAD - f/u in office once he recovers from Covid - will need repeat Carotid dopplers and ABIs  3. COVID -- un-vaccinated -- currently only requires 2 lpm O2 to maintain sats -- per IM   Risk Assessment/Risk Scores:    CHA2DS2-VASc Score = 1  This indicates a 0.6% annual risk of stroke. The patient's score is based upon: CHF History: No HTN History: No Diabetes History: No Stroke History: No Vascular Disease History: Yes Age Score: 0 Gender  Score: 0    For questions or updates, please contact [facility] Please consult [email or web address] for contact info under  Signed, Theodore Demark, PA-C  03/28/2021 10:08 AM As above, patient seen and examined.  Briefly he is a 65 year old male with past medical history of peripheral vascular disease, COPD, hyperlipidemia, gastroesophageal reflux disease, tobacco abuse admitted COVID positive for evaluation of atrial fibrillation with rapid ventricular response.  Patient complains of increasing dyspnea for the past 4 to 5 days with a mildly productive cough.  He denies fevers, chills or hemoptysis.  Prior to this event he denies dyspnea on exertion or chest pain and has never had palpitations.  No syncope or bleeding.  He was in sinus rhythm on arrival but has developed atrial fibrillation with rapid ventricular response and cardiology asked to evaluate. Echocardiogram shows sinus rhythm with nonspecific ST changes.  1 newly diagnosed atrial fibrillation with rapid ventricular response-patient remains in atrial fibrillation at time of my evaluation.  He is asymptomatic with no increased dyspnea, chest pain or palpitations.  We will continue IV Cardizem for rate control and add low-dose metoprolol 12.5 mg twice daily.  Advance as needed for rate control.  Check echocardiogram for LV function.  Check TSH.  Embolic risk factors include peripheral vascular disease and he will be 65 in November.  CHA2DS2-VASc 1 and soon-to-be 2.  Add apixaban 5 mg twice daily prior to discharge; he is presently on IV heparin.  If he does not convert we can plan cardioversion once he recovers from COVID.  2 peripheral vascular disease-continue statin.  3 hyperlipidemia-given documented vascular disease will increase Lipitor to 80 mg daily.  He will need lipids and liver check 12 weeks later.  4 tobacco abuse-patient counseled on discontinuing.  5 COVID-Per hospitalist service.  Olga Millers, MD

## 2021-03-28 NOTE — Progress Notes (Signed)
Patient brought to 4E from ED. Telemetry box applied, CCMD notified. CHG bath completed. Patient stated 0/10 pain scale. Patient oriented to room and staff. Call bell in reach.  Kenard Gower, RN

## 2021-03-28 NOTE — ED Provider Notes (Signed)
[facility] EMERGENCY DEPARTMENT Provider Note  CSN: [id number] Arrival date & time: 03/27/21 1453  Chief Complaint(s) Shortness of Breath  HPI Manuel Richardson is a 65 y.o. male here for shortness of breath over the past couple days.  Patient was diagnosed with COVID-19 at an outside facility on Saturday.  Reports he started having symptoms 2 days prior.  He is endorsing cough, dyspnea on exertion.  Has tried taking his home inhalers with minimal relief.  Patient and wife report noticing O2 saturations in the high 80s at home.  Denies any associated chest pain.  No nausea or vomiting.  No abdominal pain.  No other physical complaints.  HPI  Past Medical History Past Medical History:  Diagnosis Date  . COPD (chronic obstructive pulmonary disease) (HCC)   . Erectile dysfunction   . Full dentures   . GERD (gastroesophageal reflux disease)   . Hyperlipidemia   . Tobacco use disorder    Patient Active Problem List   Diagnosis Date Noted  . Atrial fibrillation with RVR (HCC) 03/28/2021  . COVID-19 virus infection 03/28/2021  . Peripheral vascular disease (HCC) 09/21/2018  . Bilateral carotid bruits 09/21/2018  . Tobacco use disorder 07/18/2014  . 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 Atherosclerosis 05/26/2012  . ED (erectile dysfunction) 03/30/2012  . Essential hypertension, benign 03/30/2012  . Annual physical exam 03/05/2011  . Hyperlipidemia 03/05/2011  . Carotid artery disease (HCC) 03/05/2011   Home Medication(s) Prior to Admission medications   Medication Sig Start Date End Date Taking? Authorizing Provider  albuterol (PROAIR HFA) 108 (90 Base) MCG/ACT inhaler Inhale 2 puffs into the lungs 3 (three) times daily. Patient taking differently: Inhale 2 puffs into the lungs every 6 (six) hours as needed for wheezing or shortness of breath. 09/21/18  Yes Tysinger, Kermit Balo, PA-C  ASPIRIN LOW DOSE 81 MG EC tablet Take 81 mg by mouth daily. 01/09/21  Yes [provider]  atorvastatin (LIPITOR) 40 MG tablet TAKE 1 TABLET BY MOUTH EVERY DAY Patient taking differently: Take 40 mg by mouth daily. 12/14/18  Yes Tysinger, Kermit Balo, PA-C  BREZTRI AEROSPHERE 160-9-4.8 MCG/ACT AERO Inhale 2 puffs into the lungs 2 (two) times daily. 01/09/21  Yes [provider]  HYDROcodone bit-homatropine (HYCODAN) 5-1.5 MG/5ML syrup Take 5 mLs by mouth every 6 (six) hours as needed for cough.   Yes [provider]  predniSONE (STERAPRED UNI-PAK 21 TAB) 10 MG (21) TBPK tablet Take 10 mg by mouth See admin instructions. 6,5,4,3,2,1 03/24/21  Yes [provider]  lisinopril (PRINIVIL,ZESTRIL) 10 MG tablet TAKE 1 TABLET BY MOUTH EVERY DAY Patient not taking: No sig reported 12/14/18   Tysinger, Kermit Balo, PA-C  nicotine (NICODERM CQ - DOSED IN MG/24 HOURS) 21 mg/24hr patch Place 1 patch (21 mg total) onto the skin daily. Patient not taking: No sig reported 09/21/18   Tysinger, Kermit Balo, PA-C  sildenafil (VIAGRA) 50 MG tablet Take 1 tablet (50 mg total) by mouth as needed for erectile dysfunction. Patient not taking: No sig reported 11/06/12   Tysinger, Kermit Balo, PA-C  umeclidinium-vilanterol (ANORO ELLIPTA) 62.5-25 MCG/INH AEPB Inhale 1 puff into the lungs daily. Patient not taking: No sig reported 09/21/18   Jac Canavan, PA-C  Past Surgical History Past Surgical History:  Procedure Laterality Date  . carotid artery surgery  2012   stent in left side  . COLONOSCOPY  2007  . ELBOW SURGERY  teenager   left   Family History Family History  Problem Relation Age of Onset  . Heart disease Mother        died 41 with MI  . Cancer Father        lung cancer  . Hypertension Brother   . Diabetes Neg Hx   . Stroke Neg Hx     Social History Social History   Tobacco Use  . Smoking status: Current Every Day Smoker     Packs/day: 3.00    Years: 38.00    Pack years: 114.00    Types: Cigarettes  . Smokeless tobacco: Never Used  . Tobacco comment: decreased to 1 PPD since illness last week (06/2014)  Substance Use Topics  . Alcohol use: Yes    Alcohol/week: 12.0 standard drinks    Types: 12 Cans of beer per week  . Drug use: No   Allergies Patient has no known allergies.  Review of Systems Review of Systems All other systems are reviewed and are negative for acute change except as noted in the HPI  Physical Exam Vital Signs  I have reviewed the triage vital signs BP (!) 126/104   Pulse (!) 149   Temp 97.6 F (36.4 C) (Oral)   Resp (!) 22   SpO2 95%   Physical Exam Vitals reviewed.  Constitutional:      General: He is not in acute distress.    Appearance: He is well-developed. He is not diaphoretic.  HENT:     Head: Normocephalic and atraumatic.     Nose: Nose normal.  Eyes:     General: No scleral icterus.       Right eye: No discharge.        Left eye: No discharge.     Conjunctiva/sclera: Conjunctivae normal.     Pupils: Pupils are equal, round, and reactive to light.  Cardiovascular:     Rate and Rhythm: Regular rhythm. Tachycardia present.     Heart sounds: No murmur heard. No friction rub. No gallop.   Pulmonary:     Effort: Pulmonary effort is normal. Tachypnea and prolonged expiration present. No respiratory distress.     Breath sounds: Decreased air movement present. No stridor. Wheezing (faint) present. No rales.  Abdominal:     General: There is no distension.     Palpations: Abdomen is soft.     Tenderness: There is no abdominal tenderness.  Musculoskeletal:        General: No tenderness.     Cervical back: Normal range of motion and neck supple.  Skin:    General: Skin is warm and dry.     Findings: No erythema or rash.  Neurological:     Mental Status: He is alert and oriented to person, place, and time.     ED Results and Treatments Labs (all labs ordered  are listed, but only abnormal results are displayed) Labs Reviewed  RESP PANEL BY RT-PCR (FLU A&B, COVID) ARPGX2 - Abnormal; Notable for the following components:      Result Value   SARS Coronavirus 2 by RT PCR POSITIVE (*)    All other components within normal limits  BASIC METABOLIC PANEL - Abnormal; Notable for the following components:   Sodium 133 (*)    Chloride 96 (*)  Glucose, Bld 100 (*)    All other components within normal limits  D-DIMER, QUANTITATIVE - Abnormal; Notable for the following components:   D-Dimer, Quant 1.00 (*)    All other components within normal limits  C-REACTIVE PROTEIN - Abnormal; Notable for the following components:   CRP 1.1 (*)    All other components within normal limits  CBC WITH DIFFERENTIAL/PLATELET  PROCALCITONIN  HIV ANTIBODY (ROUTINE TESTING W REFLEX)  HEPARIN LEVEL (UNFRACTIONATED)  TSH                                                                                                                         EKG  EKG Interpretation  Date/Time:  Tuesday Mar 27 2021 15:47:24 EDT Ventricular Rate:  74 PR Interval:  120 QRS Duration: 90 QT Interval:  358 QTC Calculation: 397 R Axis:   78 Text Interpretation: Normal sinus rhythm Nonspecific ST abnormality Abnormal ECG motion artifact Otherwise no significant change Confirmed by Drema Pry [phone]) on 03/28/2021 1:03:36 AM      Radiology DG Chest Port 1 View  Result Date: 03/27/2021 CLINICAL DATA:  History of COVID-19 positivity with shortness of breath EXAM: PORTABLE CHEST 1 VIEW COMPARISON:  10/03/2010 FINDINGS: Cardiac shadow is within normal limits. Aortic calcifications are noted. The lungs are hyperinflated with mild bibasilar atelectatic changes. No focal confluent infiltrate is seen. No sizable effusion is noted. No bony abnormality is seen. IMPRESSION: Mild bibasilar atelectasis. Changes of COPD. Electronically Signed   By: Alcide Clever M.D.   On: 03/27/2021 18:27    Pertinent  labs & imaging results that were available during my care of the patient were reviewed by me and considered in my medical decision making (see chart for details).  Medications Ordered in ED Medications  diltiazem (CARDIZEM) 1 mg/mL load via infusion 10 mg (10 mg Intravenous Bolus from Bag 03/28/21 0356)    And  diltiazem (CARDIZEM) 125 mg in dextrose 5% 125 mL (1 mg/mL) infusion (15 mg/hr Intravenous Infusion Verify 03/28/21 0750)  Budeson-Glycopyrrol-Formoterol 160-9-4.8 MCG/ACT AERO 2 puff (has no administration in time range)  atorvastatin (LIPITOR) tablet 40 mg (has no administration in time range)  HYDROcodone bit-homatropine (HYCODAN) 5-1.5 MG/5ML syrup 5 mL (has no administration in time range)  aspirin EC tablet 81 mg (has no administration in time range)  albuterol (VENTOLIN HFA) 108 (90 Base) MCG/ACT inhaler 2 puff (has no administration in time range)  acetaminophen (TYLENOL) tablet 650 mg (650 mg Oral Given 03/28/21 0744)  ondansetron (ZOFRAN) tablet 4 mg (has no administration in time range)    Or  ondansetron (ZOFRAN) injection 4 mg (has no administration in time range)  nicotine (NICODERM CQ - dosed in mg/24 hours) patch 21 mg (has no administration in time range)  heparin ADULT infusion 100 units/mL (25000 units/253mL) (850 Units/hr Intravenous New Bag/Given 03/28/21 0509)  predniSONE (DELTASONE) tablet 60 mg (60 mg Oral Given 03/28/21 0743)    Followed by  predniSONE (DELTASONE) tablet 50 mg (has  no administration in time range)    Followed by  predniSONE (DELTASONE) tablet 40 mg (has no administration in time range)    Followed by  predniSONE (DELTASONE) tablet 30 mg (has no administration in time range)    Followed by  predniSONE (DELTASONE) tablet 20 mg (has no administration in time range)    Followed by  predniSONE (DELTASONE) tablet 10 mg (has no administration in time range)  albuterol (VENTOLIN HFA) 108 (90 Base) MCG/ACT inhaler 8 puff (8 puffs Inhalation Given 03/28/21  0116)  ipratropium (ATROVENT HFA) inhaler 2 puff (2 puffs Inhalation Given 03/28/21 0116)  AeroChamber Plus Flo-Vu Large MISC 1 each (1 each Other Given 03/28/21 0116)  bebtelovimab EUA injection SOLN 175 mg (175 mg Intravenous Given 03/28/21 0128)  sodium chloride 0.9 % bolus 1,000 mL (0 mLs Intravenous Stopped 03/28/21 0431)  sodium chloride 0.9 % bolus 1,000 mL (0 mLs Intravenous Stopped 03/28/21 0547)  heparin bolus via infusion 3,000 Units (3,000 Units Intravenous Bolus from Bag 03/28/21 0510)                                                                                                                                    Procedures .1-3 Lead EKG Interpretation Performed by: Nira Connardama, Dontavian Marchi Eduardo, MD Authorized by: Nira Connardama, Shell Yandow Eduardo, MD     Interpretation: normal     ECG rate:  98   ECG rate assessment: normal     Rhythm: sinus rhythm     Ectopy: none     Conduction: normal   .1-3 Lead EKG Interpretation Performed by: Nira Connardama, Theressa Piedra Eduardo, MD Authorized by: Nira Connardama, Zoiee Wimmer Eduardo, MD     Interpretation: abnormal     ECG rate:  203   ECG rate assessment: tachycardic     Rhythm: atrial fibrillation     Ectopy: none     Conduction: normal   .Critical Care Performed by: Nira Connardama, Itzelle Gains Eduardo, MD Authorized by: Nira Connardama, Char Feltman Eduardo, MD   Critical care provider statement:    Critical care time (minutes):  75   Critical care was time spent personally by me on the following activities:  Discussions with consultants, evaluation of patient's response to treatment, examination of patient, ordering and performing treatments and interventions, ordering and review of laboratory studies, ordering and review of radiographic studies, pulse oximetry, re-evaluation of patient's condition, obtaining history from patient or surrogate and review of old charts    (including critical care time)  Medical Decision Making / ED Course I have reviewed the nursing notes for this encounter and the  patient's prior records (if available in EHR or on provided paperwork).   Garnette ScheuermannRandy F Sidell was evaluated in Emergency Department on 03/28/2021 for the symptoms described in the history of present illness. He was evaluated in the context of the global COVID-19 pandemic, which necessitated consideration that the patient might be at risk for infection with the SARS-CoV-2 virus that causes COVID-19.  Institutional protocols and algorithms that pertain to the evaluation of patients at risk for COVID-19 are in a state of rapid change based on information released by regulatory bodies including the CDC and federal and state organizations. These policies and algorithms were followed during the patient's care in the ED.  Respiratory distress in the setting of COPD with positive COVID. Diminished lung sounds throughout with prolonged expiratory phase concerning for COPD exacerbation. Chest x-ray without evidence of pneumonia. Patient provided with high-dose albuterol/Atrovent puffs. He is satting 92% on room air. Already on steroids and azithromycin from outside facility.  Will provide patient with monoclonal antibody infusion in hopes patient improves and is able to go home.  After infusion patient was reassessed and air movement had improved.  Additional doses of albuterol and Atrovent given.   Patient then went into A. fib RVR with rates in the 190s to 200s. Remained hemodynamically stable. Denied chest pain. Doubt PE.  Provided with IV fluids. Monitor for 30 minutes without improvement. Unsure if this is related to the inhalers versus the monoclonal antibody infusion.  Diltiazem bolus and drip started. COVID confirmed. Admitted to medicine for further management.      Final Clinical Impression(s) / ED Diagnoses Final diagnoses:  COVID-19 virus infection  COPD exacerbation (HCC)  Atrial fibrillation with RVR (HCC)      This chart was dictated using voice recognition software.  Despite best  efforts to proofread,  errors can occur which can change the documentation meaning.   Nira Conn, MD 03/28/21 [phone]

## 2021-03-28 NOTE — Progress Notes (Signed)
ANTICOAGULATION CONSULT NOTE - Initial Consult  Pharmacy Consult for heparin Indication: atrial fibrillation  No Known Allergies  Patient Measurements:   Heparin Dosing Weight: ~60 kg  Vital Signs: Temp: 97.6 F (36.4 C) (06/01 0115) Temp Source: Oral (06/01 0115) BP: 124/97 (06/01 0430) Pulse Rate: 93 (06/01 0430)  Labs: Recent Labs    03/27/21 1558  HGB 16.6  HCT 50.3  PLT 180  CREATININE 0.78    CrCl cannot be calculated (Unknown ideal weight.).   Medical History: Past Medical History:  Diagnosis Date  . COPD (chronic obstructive pulmonary disease) (HCC)   . Erectile dysfunction   . Full dentures   . GERD (gastroesophageal reflux disease)   . Hyperlipidemia   . Tobacco use disorder     Medications:  See med history Assessment: 65 yo man to start heparin for afib.  He was not on anticoagulation PTA.  Hg 16.6, PTLC 180 Goal of Therapy:  Heparin level 0.3-0.7 units/ml Monitor platelets by anticoagulation protocol: Yes   Plan:  Heparin 3000 unit bolus and drip at 850 units/hr Check heparin level 6-8 hours after start Daily HL and CBC while on heparin Monitor for bleeding complications  Brunetta Genera, Adriauna Campton Poteet 03/28/2021,5:23 AM

## 2021-03-28 NOTE — Progress Notes (Signed)
ANTICOAGULATION CONSULT NOTE   Pharmacy Consult for heparin Indication: atrial fibrillation  No Known Allergies  Patient Measurements:   Heparin Dosing Weight: ~60 kg  Vital Signs: Temp: 98.1 F (36.7 C) (06/01 1406) Temp Source: Oral (06/01 1406) BP: 112/70 (06/01 1406) Pulse Rate: 104 (06/01 1406)  Labs: Recent Labs    03/27/21 1558 03/28/21 1300  HGB 16.6  --   HCT 50.3  --   PLT 180  --   HEPARINUNFRC  --  <0.10*  CREATININE 0.78  --     CrCl cannot be calculated (Unknown ideal weight.).   Medical History: Past Medical History:  Diagnosis Date  . COPD (chronic obstructive pulmonary disease) (HCC)   . Erectile dysfunction   . Full dentures   . GERD (gastroesophageal reflux disease)   . Hyperlipidemia   . Tobacco use disorder     Assessment: 65 yo man on heparin for afib.  He was not on anticoagulation PTA. Plans noted for apixaban (cost is $40 per month) -heparin level undetectable  Goal of Therapy:  Heparin level 0.3-0.7 units/ml Monitor platelets by anticoagulation protocol: Yes   Plan:  Heparin 1500 unit bolus increase infusion to 1050 units/hr Check heparin level 6-8 hours after start Daily HL and CBC while on heparin  Harland German, PharmD Clinical Pharmacist **Pharmacist phone directory can now be found on [email or web address] ([email or web address]).  Listed under [facility].

## 2021-03-28 NOTE — ED Notes (Signed)
Pt ambulated to restroom with O2 tank

## 2021-03-28 NOTE — Progress Notes (Signed)
  Echocardiogram 2D Echocardiogram has been performed.  Manuel Richardson 03/28/2021, 12:27 PM

## 2021-03-28 NOTE — ED Notes (Addendum)
Dr Jens Som w/ cardiology at bedside.

## 2021-03-28 NOTE — H&P (Signed)
History and Physical    Manuel Richardson HBZ:[id number] DOB: 1956-03-02 DOA: 03/27/2021  PCP: Charlane Ferretti, DO  Patient coming from: Home  I have personally briefly reviewed patient's old medical records in [facility]  Chief Complaint: COVID-19  HPI: Manuel Richardson is a 65 y.o. male with medical history significant of COPD with emphysema, ongoing smoking, HTN.  Pt unvaccinated to COVID.  Pt went to beach with family last week.  Came back, started feeling ill.  Diagnosed with COVID this past week and started on prednisone taper and Z-pak as outpt.  Symptoms of moderate SOB and cough have persisted so presents to ED this evening.  No CP.   ED Course: [place] 91% on RA, 94% on 2L via [place].  CXR neg.  EDP gave MAB therapy as well as neb treatments.  After neb treatments pt developed new onset A.Fib RVR with rate of 200 initially.  Pt given cardizem bolus and gtt, rate now 160s.   Review of Systems: As per HPI, otherwise all review of systems negative.  Past Medical History:  Diagnosis Date  . COPD (chronic obstructive pulmonary disease) (HCC)   . Erectile dysfunction   . Full dentures   . GERD (gastroesophageal reflux disease)   . Hyperlipidemia   . Tobacco use disorder     Past Surgical History:  Procedure Laterality Date  . carotid artery surgery  2012   stent in left side  . COLONOSCOPY  2007  . ELBOW SURGERY  teenager   left     reports that he has been smoking cigarettes. He has a 114.00 pack-year smoking history. He has never used smokeless tobacco. He reports current alcohol use of about 12.0 standard drinks of alcohol per week. He reports that he does not use drugs.  No Known Allergies  Family History  Problem Relation Age of Onset  . Heart disease Mother        died 49 with MI  . Cancer Father        lung cancer  . Hypertension Brother   . Diabetes Neg Hx   . Stroke Neg Hx      Prior to Admission medications   Medication Sig Start Date  End Date Taking? Authorizing Provider  albuterol (PROAIR HFA) 108 (90 Base) MCG/ACT inhaler Inhale 2 puffs into the lungs 3 (three) times daily. Patient taking differently: Inhale 2 puffs into the lungs every 6 (six) hours as needed for wheezing or shortness of breath. 09/21/18  Yes Tysinger, Kermit Balo, PA-C  ASPIRIN LOW DOSE 81 MG EC tablet Take 81 mg by mouth daily. 01/09/21  Yes [provider]  atorvastatin (LIPITOR) 40 MG tablet TAKE 1 TABLET BY MOUTH EVERY DAY Patient taking differently: Take 40 mg by mouth daily. 12/14/18  Yes Tysinger, Kermit Balo, PA-C  BREZTRI AEROSPHERE 160-9-4.8 MCG/ACT AERO Inhale 2 puffs into the lungs 2 (two) times daily. 01/09/21  Yes [provider]  HYDROcodone bit-homatropine (HYCODAN) 5-1.5 MG/5ML syrup Take 5 mLs by mouth every 6 (six) hours as needed for cough.   Yes [provider]  predniSONE (STERAPRED UNI-PAK 21 TAB) 10 MG (21) TBPK tablet Take 10 mg by mouth See admin instructions. 6,5,4,3,2,1 03/24/21  Yes [provider]  lisinopril (PRINIVIL,ZESTRIL) 10 MG tablet TAKE 1 TABLET BY MOUTH EVERY DAY Patient not taking: No sig reported 12/14/18   Tysinger, Kermit Balo, PA-C  nicotine (NICODERM CQ - DOSED IN MG/24 HOURS) 21 mg/24hr patch Place 1 patch (21 mg  total) onto the skin daily. Patient not taking: No sig reported 09/21/18   Tysinger, Kermit Balo, PA-C  sildenafil (VIAGRA) 50 MG tablet Take 1 tablet (50 mg total) by mouth as needed for erectile dysfunction. Patient not taking: No sig reported 11/06/12   Tysinger, Kermit Balo, PA-C  umeclidinium-vilanterol (ANORO ELLIPTA) 62.5-25 MCG/INH AEPB Inhale 1 puff into the lungs daily. Patient not taking: No sig reported 09/21/18   Jac Canavan, PA-C    Physical Exam: Vitals:   03/28/21 0355 03/28/21 0405 03/28/21 0415 03/28/21 0430  BP: 123/74 114/81 115/87 (!) 124/97  Pulse: (!) 106 (!) 150 (!) 56 93  Resp: (!) 25 (!) 22 (!) 23 (!) 23  Temp:      TempSrc:      SpO2: 92% 91% 92% 92%     Constitutional: NAD, calm, comfortable Eyes: PERRL, lids and conjunctivae normal ENMT: Mucous membranes are moist. Posterior pharynx clear of any exudate or lesions.Normal dentition.  Neck: normal, supple, no masses, no thyromegaly Respiratory: clear to auscultation bilaterally, no wheezing, no crackles. Normal respiratory effort. No accessory muscle use.  Cardiovascular: IRR, IRR. Abdomen: no tenderness, no masses palpated. No hepatosplenomegaly. Bowel sounds positive.  Musculoskeletal: no clubbing / cyanosis. No joint deformity upper and lower extremities. Good ROM, no contractures. Normal muscle tone.  Skin: no rashes, lesions, ulcers. No induration Neurologic: CN 2-12 grossly intact. Sensation intact, DTR normal. Strength 5/5 in all 4.  Psychiatric: Normal judgment and insight. Alert and oriented x 3. Normal mood.    Labs on Admission: I have personally reviewed following labs and imaging studies  CBC: Recent Labs  Lab 03/27/21 1558  WBC 9.3  NEUTROABS 7.3  HGB 16.6  HCT 50.3  MCV 95.3  PLT 180   Basic Metabolic Panel: Recent Labs  Lab 03/27/21 1558  NA 133*  K 4.9  CL 96*  CO2 29  GLUCOSE 100*  BUN 13  CREATININE 0.78  CALCIUM 9.4   GFR: CrCl cannot be calculated (Unknown ideal weight.). Liver Function Tests: No results for input(s): AST, ALT, ALKPHOS, BILITOT, PROT, ALBUMIN in the last 168 hours. No results for input(s): LIPASE, AMYLASE in the last 168 hours. No results for input(s): AMMONIA in the last 168 hours. Coagulation Profile: No results for input(s): INR, PROTIME in the last 168 hours. Cardiac Enzymes: No results for input(s): CKTOTAL, CKMB, CKMBINDEX, TROPONINI in the last 168 hours. BNP (last 3 results) No results for input(s): PROBNP in the last 8760 hours. HbA1C: No results for input(s): HGBA1C in the last 72 hours. CBG: No results for input(s): GLUCAP in the last 168 hours. Lipid Profile: No results for input(s): CHOL, HDL, LDLCALC,  TRIG, CHOLHDL, LDLDIRECT in the last 72 hours. Thyroid Function Tests: No results for input(s): TSH, T4TOTAL, FREET4, T3FREE, THYROIDAB in the last 72 hours. Anemia Panel: No results for input(s): VITAMINB12, FOLATE, FERRITIN, TIBC, IRON, RETICCTPCT in the last 72 hours. Urine analysis:    Component Value Date/Time   COLORURINE YELLOW 10/30/2010 1037   APPEARANCEUR CLEAR 10/30/2010 1037   LABSPEC 1.009 10/30/2010 1037   PHURINE 5.0 10/30/2010 1037   GLUCOSEU NEGATIVE 10/30/2010 1037   HGBUR NEGATIVE 10/30/2010 1037   BILIRUBINUR neg 03/30/2012 1010   KETONESUR NEGATIVE 10/30/2010 1037   PROTEINUR neg 03/30/2012 1010   PROTEINUR NEGATIVE 10/30/2010 1037   UROBILINOGEN negative 03/30/2012 1010   UROBILINOGEN 0.2 10/30/2010 1037   NITRITE neg 03/30/2012 1010   NITRITE NEGATIVE 10/30/2010 1037   LEUKOCYTESUR Negative 03/30/2012 1010  Radiological Exams on Admission: DG Chest Port 1 View  Result Date: 03/27/2021 CLINICAL DATA:  History of COVID-19 positivity with shortness of breath EXAM: PORTABLE CHEST 1 VIEW COMPARISON:  10/03/2010 FINDINGS: Cardiac shadow is within normal limits. Aortic calcifications are noted. The lungs are hyperinflated with mild bibasilar atelectatic changes. No focal confluent infiltrate is seen. No sizable effusion is noted. No bony abnormality is seen. IMPRESSION: Mild bibasilar atelectasis. Changes of COPD. Electronically Signed   By: Alcide Clever M.D.   On: 03/27/2021 18:27    EKG: Independently reviewed.  Assessment/Plan Principal Problem:   Atrial fibrillation with RVR (HCC) Active Problems:   Essential hypertension, benign   COPD with emphysema (HCC)   COVID-19 virus infection    1. New onset A.Fib RVR - 1. Onset after neb treatments + MAB infusion while pt in ED. 2. Cardizem bolus and gtt 3. Pt asymptomatic despite HR still in the 160s ST depressions. 4. CHADS-VASC = 2 5. Starting heparin gtt 6. Tele monitor 2. COVID-19 - 1. Got MAB  therapy in ED 2. Currently satting 94% on 2L via [place] 3. Holding off on remdesivir since he just got MAB therapy 4. Will continue prednisone taper that he started as outpt. 5. Covid pathway 6. Daily labs 7. DC the azithromycin 3. COPD - 1. Cont prednisone taper started as outpt 2. PRN albuterol 3. Cont home nebs 4. HTN - 1. Holding lisinopril that pt hasnt been taking anyhow 2. Pt on cardizem gtt for A.Fib RVR 3. Current BP 124/97  DVT prophylaxis: Heparin gtt Code Status: Full Family Communication: No family in room Disposition Plan: Home after A.Fib rate controlled and off oxygen Consults called: None Admission status: Place in obs   Vinisha Faxon M. DO [facility]  How to contact the [facility] Attending or Consulting provider 7A - 7P or covering provider during after hours 7P -7A, for this patient?  1. Check the care team in [facility] and look for a) attending/consulting [facility] provider listed and b) the [facility] team listed 2. Log into [email or web address]  Amion Physician Scheduling and messaging for groups and whole hospitals  On call and physician scheduling software for group practices, residents, hospitalists and other medical providers for call, clinic, rotation and shift schedules. OnCall [employer] is a hospital-wide system for scheduling doctors and paging doctors on call. EasyPlot is for scientific plotting and data analysis.  [email or web address]  and use 's universal password to access. If you do not have the password, please contact the hospital operator.  3. Locate the [facility] provider you are looking for under [facility] and page to a number that you can be directly reached. 4. If you still have difficulty reaching the provider, please page the [facility] (Director on Call) for the Hospitalists listed on amion for assistance.  03/28/2021, 5:05 AM

## 2021-03-28 NOTE — ED Notes (Signed)
Called pharmacy to verify cough medicine.

## 2021-03-28 NOTE — ED Notes (Addendum)
MD aware of pt HR and at bedside.

## 2021-03-29 DIAGNOSIS — K219 Gastro-esophageal reflux disease without esophagitis: Secondary | ICD-10-CM | POA: Diagnosis present

## 2021-03-29 DIAGNOSIS — R739 Hyperglycemia, unspecified: Secondary | ICD-10-CM | POA: Diagnosis present

## 2021-03-29 DIAGNOSIS — Z79899 Other long term (current) drug therapy: Secondary | ICD-10-CM | POA: Diagnosis not present

## 2021-03-29 DIAGNOSIS — E785 Hyperlipidemia, unspecified: Secondary | ICD-10-CM | POA: Diagnosis present

## 2021-03-29 DIAGNOSIS — I081 Rheumatic disorders of both mitral and tricuspid valves: Secondary | ICD-10-CM | POA: Diagnosis present

## 2021-03-29 DIAGNOSIS — Z28310 Unvaccinated for covid-19: Secondary | ICD-10-CM | POA: Diagnosis not present

## 2021-03-29 DIAGNOSIS — Z8249 Family history of ischemic heart disease and other diseases of the circulatory system: Secondary | ICD-10-CM | POA: Diagnosis not present

## 2021-03-29 DIAGNOSIS — J441 Chronic obstructive pulmonary disease with (acute) exacerbation: Secondary | ICD-10-CM | POA: Diagnosis present

## 2021-03-29 DIAGNOSIS — U071 COVID-19: Secondary | ICD-10-CM | POA: Diagnosis present

## 2021-03-29 DIAGNOSIS — I739 Peripheral vascular disease, unspecified: Secondary | ICD-10-CM | POA: Diagnosis present

## 2021-03-29 DIAGNOSIS — I480 Paroxysmal atrial fibrillation: Secondary | ICD-10-CM | POA: Diagnosis present

## 2021-03-29 DIAGNOSIS — I10 Essential (primary) hypertension: Secondary | ICD-10-CM | POA: Diagnosis present

## 2021-03-29 DIAGNOSIS — Z7982 Long term (current) use of aspirin: Secondary | ICD-10-CM | POA: Diagnosis not present

## 2021-03-29 DIAGNOSIS — Z7951 Long term (current) use of inhaled steroids: Secondary | ICD-10-CM | POA: Diagnosis not present

## 2021-03-29 DIAGNOSIS — F17210 Nicotine dependence, cigarettes, uncomplicated: Secondary | ICD-10-CM | POA: Diagnosis present

## 2021-03-29 DIAGNOSIS — I4891 Unspecified atrial fibrillation: Secondary | ICD-10-CM | POA: Diagnosis not present

## 2021-03-29 DIAGNOSIS — J439 Emphysema, unspecified: Secondary | ICD-10-CM | POA: Diagnosis present

## 2021-03-29 LAB — CBC WITH DIFFERENTIAL/PLATELET
Abs Immature Granulocytes: 0.1 10*3/uL — ABNORMAL HIGH (ref 0.00–0.07)
Basophils Absolute: 0 10*3/uL (ref 0.0–0.1)
Basophils Relative: 0 %
Eosinophils Absolute: 0 10*3/uL (ref 0.0–0.5)
Eosinophils Relative: 0 %
HCT: 41.9 % (ref 39.0–52.0)
Hemoglobin: 14 g/dL (ref 13.0–17.0)
Lymphocytes Relative: 31 %
Lymphs Abs: 2 10*3/uL (ref 0.7–4.0)
MCH: 31 pg (ref 26.0–34.0)
MCHC: 33.4 g/dL (ref 30.0–36.0)
MCV: 92.9 fL (ref 80.0–100.0)
Monocytes Absolute: 0.6 10*3/uL (ref 0.1–1.0)
Monocytes Relative: 9 %
Myelocytes: 1 %
Neutro Abs: 3.7 10*3/uL (ref 1.7–7.7)
Neutrophils Relative %: 59 %
Platelets: 154 10*3/uL (ref 150–400)
RBC: 4.51 MIL/uL (ref 4.22–5.81)
RDW: 13.6 % (ref 11.5–15.5)
WBC: 6.3 10*3/uL (ref 4.0–10.5)
nRBC: 0 % (ref 0.0–0.2)
nRBC: 0 /100 WBC

## 2021-03-29 LAB — COMPREHENSIVE METABOLIC PANEL
ALT: 30 U/L (ref 0–44)
AST: 20 U/L (ref 15–41)
Albumin: 2.9 g/dL — ABNORMAL LOW (ref 3.5–5.0)
Alkaline Phosphatase: 59 U/L (ref 38–126)
Anion gap: 5 (ref 5–15)
BUN: 15 mg/dL (ref 8–23)
CO2: 25 mmol/L (ref 22–32)
Calcium: 8.9 mg/dL (ref 8.9–10.3)
Chloride: 108 mmol/L (ref 98–111)
Creatinine, Ser: 0.79 mg/dL (ref 0.61–1.24)
GFR, Estimated: >60 mL/min (ref >60)
Glucose, Bld: 132 mg/dL — ABNORMAL HIGH (ref 70–99)
Potassium: 4.7 mmol/L (ref 3.5–5.1)
Sodium: 138 mmol/L (ref 135–145)
Total Bilirubin: 0.6 mg/dL (ref 0.3–1.2)
Total Protein: 5.5 g/dL — ABNORMAL LOW (ref 6.5–8.1)

## 2021-03-29 LAB — C-REACTIVE PROTEIN: CRP: 3.7 mg/dL — ABNORMAL HIGH (ref <1.0)

## 2021-03-29 LAB — TSH: TSH: 1.943 u[IU]/mL (ref 0.350–4.500)

## 2021-03-29 LAB — D-DIMER, QUANTITATIVE: D-Dimer, Quant: 0.9 ug/mL-FEU — ABNORMAL HIGH (ref 0.00–0.50)

## 2021-03-29 LAB — HEPARIN LEVEL (UNFRACTIONATED): Heparin Unfractionated: 0.38 IU/mL (ref 0.30–0.70)

## 2021-03-29 MED ORDER — SODIUM CHLORIDE 0.9 % IV SOLN
100.0000 mg | Freq: Every day | INTRAVENOUS | Status: DC
  Administered 2021-03-30 – 2021-04-01 (×3): 100 mg via INTRAVENOUS
  Filled 2021-03-29 (×3): qty 20

## 2021-03-29 MED ORDER — METOPROLOL TARTRATE 25 MG PO TABS
25.0000 mg | ORAL_TABLET | Freq: Two times a day (BID) | ORAL | Status: DC
  Administered 2021-03-29 – 2021-03-30 (×2): 25 mg via ORAL
  Filled 2021-03-29 (×2): qty 1

## 2021-03-29 MED ORDER — APIXABAN 5 MG PO TABS
5.0000 mg | ORAL_TABLET | Freq: Two times a day (BID) | ORAL | Status: DC
  Administered 2021-03-29 – 2021-04-01 (×7): 5 mg via ORAL
  Filled 2021-03-29 (×7): qty 1

## 2021-03-29 MED ORDER — IPRATROPIUM-ALBUTEROL 20-100 MCG/ACT IN AERS
1.0000 | INHALATION_SPRAY | Freq: Four times a day (QID) | RESPIRATORY_TRACT | Status: DC
  Administered 2021-03-29 – 2021-03-31 (×7): 1 via RESPIRATORY_TRACT
  Filled 2021-03-29: qty 4

## 2021-03-29 MED ORDER — METHYLPREDNISOLONE SODIUM SUCC 125 MG IJ SOLR
60.0000 mg | Freq: Three times a day (TID) | INTRAMUSCULAR | Status: DC
  Administered 2021-03-29 – 2021-03-31 (×6): 60 mg via INTRAVENOUS
  Filled 2021-03-29 (×6): qty 2

## 2021-03-29 MED ORDER — DILTIAZEM HCL 60 MG PO TABS
90.0000 mg | ORAL_TABLET | Freq: Four times a day (QID) | ORAL | Status: DC
  Administered 2021-03-29 – 2021-03-30 (×4): 90 mg via ORAL
  Filled 2021-03-29 (×4): qty 2

## 2021-03-29 MED ORDER — SODIUM CHLORIDE 0.9 % IV SOLN
200.0000 mg | Freq: Once | INTRAVENOUS | Status: AC
  Administered 2021-03-29: 200 mg via INTRAVENOUS
  Filled 2021-03-29: qty 40

## 2021-03-29 NOTE — Progress Notes (Addendum)
PROGRESS NOTE    Manuel Richardson  PJA:[id number] DOB: 11-Aug-1956 DOA: 03/27/2021 PCP: Charlane Ferretti, DO   Chief Complaint  Patient presents with  . Shortness of Breath    Brief Narrative:    Manuel Richardson is a 65 y.o. male with medical history significant of COPD with emphysema, ongoing smoking, HTN. Pt unvaccinated to COVID.  Pt went to beach with family last week.  Came back, started feeling ill.  Diagnosed with COVID this past week and started on prednisone taper and Z-pak as outpt.  Symptoms of moderate SOB and cough have persisted so presents to ED this evening.  No CP. Patient presented secondary to shortness of breath, work-up significant for A. fib with RVR, and COVID-19 infection.    Assessment & Plan:   Principal Problem:   Atrial fibrillation with RVR (HCC) Active Problems:   Essential hypertension, benign   COPD with emphysema (HCC)   COVID-19 virus infection   New onset A.Fib RVR  New onset, management by cardiology, initially on Cardizem drip, started on low-dose metoprolol as well, heart rate in the 120s this morning, he is converted to p.o. Cardizem, and his metoprolol dose has been increased as well. -TSH within normal limit, -2D echo with normal LV function -On heparin GTT T for anticoagulation, will transition to Eliquis -Per cardiology if he does not convert, then plan cardioversion once he recovers from COVID  COVID-19 infection -He received monoclonal therapy in ED -He does report dyspnea with activity, he is on 2 L nasal cannula this morning, he did dip to the 88% once weaned off oxygen, I will change his p.o. prednisone to IV Solu-Medrol, and will start him on IV remdesivir as well.  COPD exacerbation -Unclear if his exertional dyspnea,and  hypoxia related to COVID or COPD exacerbation , there is already evidence of COPD on his chest x-ray, but he is treated with IV steroids, will start on scheduled Combivent as well.  Tobacco abuse -He  was counseled  HTN  -He is currently on Cardizem and metoprolol    DVT prophylaxis: Heparin GTT>Eliquis Code Status: Full Family Communication: None at bedside Disposition:   Status is: Observation  The patient will require care spanning > 2 midnights and should be moved to inpatient because: IV treatments appropriate due to intensity of illness or inability to take PO  Dispo: The patient is from: Home              Anticipated d/c is to: Home              Patient currently is not medically stable to d/c.     Difficult to place patient No       Consultants:   Cardiology   Subjective: No dyspnea at rest, but reports significant dyspnea with minimal activity, reports some cough, denies any chest pain  Objective: Vitals:   03/28/21 2207 03/28/21 2208 03/29/21 0505 03/29/21 0827  BP: 114/80 114/80 117/73 117/84  Pulse: 100 (!) 111 91 100  Resp: 18 20 17 20   Temp: 98 F (36.7 C)  98 F (36.7 C) 97.8 F (36.6 C)  TempSrc: Oral  Oral Oral  SpO2: 96% 91% 95% 90%  Weight:        Intake/Output Summary (Last 24 hours) at 03/29/2021 1048 Last data filed at 03/29/2021 0827 Gross per 24 hour  Intake 1088.49 ml  Output 1071 ml  Net 17.49 ml   Filed Weights   03/28/21 1406  Weight: 57 kg  Examination:  Awake Alert, Oriented X 3, No new F.N deficits, Normal affect Symmetrical Chest wall movement, Good air movement bilaterally, CTAB Irregular irregular ,No Gallops,Rubs or new Murmurs, No Parasternal Heave +ve B.Sounds, Abd Soft, No tenderness, No rebound - guarding or rigidity. No Cyanosis, Clubbing or edema, No new Rash or bruise       Data Reviewed: I have personally reviewed following labs and imaging studies  CBC: Recent Labs  Lab 03/27/21 1558 03/29/21 0415  WBC 9.3 6.3  NEUTROABS 7.3 3.7  HGB 16.6 14.0  HCT 50.3 41.9  MCV 95.3 92.9  PLT 180 154    Basic Metabolic Panel: Recent Labs  Lab 03/27/21 1558 03/29/21 0415  NA 133* 138  K 4.9 4.7   CL 96* 108  CO2 29 25  GLUCOSE 100* 132*  BUN 13 15  CREATININE 0.78 0.79  CALCIUM 9.4 8.9    GFR: CrCl cannot be calculated (Unknown ideal weight.).  Liver Function Tests: Recent Labs  Lab 03/29/21 0415  AST 20  ALT 30  ALKPHOS 59  BILITOT 0.6  PROT 5.5*  ALBUMIN 2.9*    CBG: No results for input(s): GLUCAP in the last 168 hours.   Recent Results (from the past 240 hour(s))  Resp Panel by RT-PCR (Flu A&B, Covid) Nasopharyngeal Swab     Status: Abnormal   Collection Time: 03/28/21  4:03 AM   Specimen: Nasopharyngeal Swab; Nasopharyngeal(NP) swabs in vial transport medium  Result Value Ref Range Status   SARS Coronavirus 2 by RT PCR POSITIVE (A) NEGATIVE Final    Comment: RESULT CALLED TO, READ BACK BY AND VERIFIED WITH: RN GRACE TATE BY MESSAN H. AT 1610 ON 03/28/2021 (NOTE) SARS-CoV-2 target nucleic acids are DETECTED.  The SARS-CoV-2 RNA is generally detectable in upper respiratory specimens during the acute phase of infection. Positive results are indicative of the presence of the identified virus, but do not rule out bacterial infection or co-infection with other pathogens not detected by the test. Clinical correlation with patient history and other diagnostic information is necessary to determine patient infection status. The expected result is Negative.  Fact Sheet for Patients: BloggerCourse.com  Fact Sheet for Healthcare Providers: SeriousBroker.it  This test is not yet approved or cleared by the [place] FDA and  has been authorized for detection and/or diagnosis of SARS-CoV-2 by FDA under an Emergency Use Authorization (EUA).  This EUA will remain in effect (meaning thi s test can be used) for the duration of  the COVID-19 declaration under Section 564(b)(1) of the Act, 21 U.S.C. section 360bbb-3(b)(1), unless the authorization is terminated or revoked sooner.     Influenza A by PCR NEGATIVE  NEGATIVE Final   Influenza B by PCR NEGATIVE NEGATIVE Final    Comment: (NOTE) The Xpert Xpress SARS-CoV-2/FLU/RSV plus assay is intended as an aid in the diagnosis of influenza from Nasopharyngeal swab specimens and should not be used as a sole basis for treatment. Nasal washings and aspirates are unacceptable for Xpert Xpress SARS-CoV-2/FLU/RSV testing.  Fact Sheet for Patients: BloggerCourse.com  Fact Sheet for Healthcare Providers: SeriousBroker.it  This test is not yet approved or cleared by the [place] FDA and has been authorized for detection and/or diagnosis of SARS-CoV-2 by FDA under an Emergency Use Authorization (EUA). This EUA will remain in effect (meaning this test can be used) for the duration of the COVID-19 declaration under Section 564(b)(1) of the Act, 21 U.S.C. section 360bbb-3(b)(1), unless the authorization is terminated or revoked.  Performed  at [facility], [address] [place], [place] [address]          Radiology Studies: DG Chest Port 1 View  Result Date: 03/27/2021 CLINICAL DATA:  History of COVID-19 positivity with shortness of breath EXAM: PORTABLE CHEST 1 VIEW COMPARISON:  10/03/2010 FINDINGS: Cardiac shadow is within normal limits. Aortic calcifications are noted. The lungs are hyperinflated with mild bibasilar atelectatic changes. No focal confluent infiltrate is seen. No sizable effusion is noted. No bony abnormality is seen. IMPRESSION: Mild bibasilar atelectasis. Changes of COPD. Electronically Signed   By: Alcide Clever M.D.   On: 03/27/2021 18:27   ECHOCARDIOGRAM COMPLETE  Result Date: 03/28/2021    ECHOCARDIOGRAM REPORT   Patient Name:   CAMP GOPAL Date of Exam: 03/28/2021 Medical Rec #:  [id number]      Height:       69.0 in Accession #:    [id number]     Weight:       133.2 lb Date of Birth:  02-26-1956     BSA:          1.738 m Patient Age:    64 years       BP:            96/72 mmHg Patient Gender: M              HR:           111 bpm. Exam Location:  Inpatient Procedure: 2D Echo, Cardiac Doppler and Color Doppler Indications:    I48.0 Paroxysmal atrial fibrillation  History:        Patient has no prior history of Echocardiogram examinations.                 COPD; Risk Factors:Dyslipidemia, GERD and Current Smoker.                 COVID-19 Positive.  Sonographer:    Elmarie Shiley Dance Referring Phys: 51 Elanda Garmany S Tashana Haberl IMPRESSIONS  1. Left ventricular ejection fraction, by estimation, is 55 to 60%. The left ventricle has normal function. The left ventricle demonstrates regional wall motion abnormalities (see scoring diagram/findings for description). Left ventricular diastolic parameters are indeterminate.  2. Right ventricular systolic function is normal. The right ventricular size is normal. There is normal pulmonary artery systolic pressure. The estimated right ventricular systolic pressure is 30.3 mmHg.  3. The mitral valve is grossly normal. Moderate mitral valve regurgitation - suspect mechanism is posterior leaflet restriction. No evidence of mitral stenosis.  4. The aortic valve is normal in structure. Aortic valve regurgitation is not visualized. No aortic stenosis is present.  5. The inferior vena cava is dilated in size with >50% respiratory variability, suggesting right atrial pressure of 8 mmHg. FINDINGS  Left Ventricle: Left ventricular ejection fraction, by estimation, is 55 to 60%. The left ventricle has normal function. The left ventricle demonstrates regional wall motion abnormalities. The left ventricular internal cavity size was normal in size. There is no left ventricular hypertrophy. Left ventricular diastolic parameters are indeterminate.  LV Wall Scoring: The posterior wall, basal anterolateral segment, and basal inferior segment are hypokinetic. Right Ventricle: The right ventricular size is normal. No increase in right ventricular wall thickness. Right  ventricular systolic function is normal. There is normal pulmonary artery systolic pressure. The tricuspid regurgitant velocity is 2.36 m/s, and  with an assumed right atrial pressure of 8 mmHg, the estimated right ventricular systolic pressure is 30.3 mmHg. Left Atrium: Left atrial size  was normal in size. Right Atrium: Right atrial size was normal in size. Pericardium: There is no evidence of pericardial effusion. Mitral Valve: The mitral valve is grossly normal. Mild mitral annular calcification. Moderate mitral valve regurgitation. No evidence of mitral valve stenosis. Tricuspid Valve: The tricuspid valve is normal in structure. Tricuspid valve regurgitation is mild . No evidence of tricuspid stenosis. Aortic Valve: The aortic valve is normal in structure. Aortic valve regurgitation is not visualized. No aortic stenosis is present. Pulmonic Valve: The pulmonic valve was normal in structure. Pulmonic valve regurgitation is trivial. No evidence of pulmonic stenosis. Aorta: The aortic root is normal in size and structure. Venous: The inferior vena cava is dilated in size with greater than 50% respiratory variability, suggesting right atrial pressure of 8 mmHg. IAS/Shunts: No atrial level shunt detected by color flow Doppler.  LEFT VENTRICLE PLAX 2D LVIDd:         3.80 cm LVIDs:         3.10 cm LV PW:         1.10 cm LV IVS:        0.90 cm LVOT diam:     2.00 cm LV SV:         44 LV SV Index:   25 LVOT Area:     3.14 cm  RIGHT VENTRICLE          IVC RV Basal diam:  2.50 cm  IVC diam: 2.60 cm TAPSE (M-mode): 1.6 cm LEFT ATRIUM             Index       RIGHT ATRIUM           Index LA diam:        3.70 cm 2.13 cm/m  RA Area:     12.70 cm LA Vol (A2C):   43.9 ml 25.26 ml/m RA Volume:   30.10 ml  17.32 ml/m LA Vol (A4C):   39.5 ml 22.72 ml/m LA Biplane Vol: 42.7 ml 24.57 ml/m  AORTIC VALVE LVOT Vmax:   75.45 cm/s LVOT Vmean:  50.450 cm/s LVOT VTI:    0.141 m  AORTA Ao Root diam: 3.60 cm MITRAL VALVE                 TRICUSPID VALVE MV Area (PHT): 3.81 cm     TR Peak grad:   22.3 mmHg MV Decel Time: 199 msec     TR Vmax:        236.00 cm/s MV E velocity: 125.00 cm/s                             SHUNTS                             Systemic VTI:  0.14 m                             Systemic Diam: 2.00 cm Weston BrassGayatri Acharya MD Electronically signed by Weston BrassGayatri Acharya MD Signature Date/Time: 03/28/2021/3:27:33 PM    Final         Scheduled Meds: . apixaban  5 mg Oral BID  . aspirin EC  81 mg Oral Daily  . atorvastatin  80 mg Oral Daily  . diltiazem  90 mg Oral Q6H  . fluticasone furoate-vilanterol  1 puff Inhalation Daily   And  .  umeclidinium bromide  1 puff Inhalation Daily  . metoprolol tartrate  25 mg Oral BID  . nicotine  21 mg Transdermal Daily  . pantoprazole  40 mg Oral Daily  . [START ON 03/30/2021] predniSONE  40 mg Oral Q breakfast   Followed by  . [START ON 03/31/2021] predniSONE  30 mg Oral Q breakfast   Followed by  . [START ON 04/01/2021] predniSONE  20 mg Oral Q breakfast   Followed by  . [START ON 04/02/2021] predniSONE  10 mg Oral Q breakfast   Continuous Infusions:    LOS: 0 days     Huey Bienenstock, MD [facility]   To contact the attending provider between 7A-7P or the covering provider during after hours 7P-7A, please log into the web site [email or web address] and access using universal [place] password for that web site. If you do not have the password, please call the hospital operator.  03/29/2021, 10:48 AM

## 2021-03-29 NOTE — TOC Initial Note (Signed)
Transition of Care [facility]) - Initial/Assessment Note    Patient Details  Name: Manuel Richardson MRN: [id number] Date of Birth: Dec 26, 1955  Transition of Care [facility]) CM/SW Contact:    Lockie Pares, RN Phone Number: 03/29/2021, 4:30 PM  Clinical Narrative:                 65  Years old male admitted with atrial fibrillation new onset on Cardizem drip.. Unvaccinated  COVID positive.  eligibilty sent for DOAC. Smoker, COPD, may need home oxygen. CM will follow for needs  Expected Discharge Plan: Home w Home Health Services Barriers to Discharge: Continued Medical Work up   Patient Goals and CMS Choice        Expected Discharge Plan and Services Expected Discharge Plan: Home w Home Health Services       Living arrangements for the past 2 months: Single Family Home                                      Prior Living Arrangements/Services Living arrangements for the past 2 months: Single Family Home   Patient language and need for interpreter reviewed:: Yes        Need for Family Participation in Patient Care: Yes (Comment) Care giver support system in place?: Yes (comment)   Criminal Activity/Legal Involvement Pertinent to Current Situation/Hospitalization: No - Comment as needed  Activities of Daily Living      Permission Sought/Granted                  Emotional Assessment       Orientation: : Oriented to Self,Oriented to Place,Oriented to  Time,Oriented to Situation Alcohol / Substance Use: Tobacco Use Psych Involvement: No (comment)  Admission diagnosis:  COPD exacerbation (HCC) [J44.1] Atrial fibrillation with RVR (HCC) [I48.91] COVID-19 virus infection [U07.1] Patient Active Problem List   Diagnosis Date Noted  . Atrial fibrillation with RVR (HCC) 03/28/2021  . COVID-19 virus infection 03/28/2021  . Peripheral vascular disease (HCC) 09/21/2018  . Bilateral carotid bruits 09/21/2018  . Tobacco use disorder 07/18/2014  . 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 Atherosclerosis 05/26/2012  . ED (erectile dysfunction) 03/30/2012  . Essential hypertension, benign 03/30/2012  . Annual physical exam 03/05/2011  . Hyperlipidemia 03/05/2011  . Carotid artery disease (HCC) 03/05/2011   PCP:  Charlane Ferretti, DO Pharmacy:   [facility] [address] [place] [facility] [address] [facility] [address] [place] [address] Phone: [phone] Fax: [phone]     Social Determinants of Health (SDOH) Interventions    Readmission Risk Interventions No flowsheet data found.

## 2021-03-29 NOTE — Progress Notes (Signed)
Progress Note  Patient Name: Manuel Richardson Date of Encounter: 03/29/2021  [facility] Cardiologist: New; Dr Jens Som  Subjective   No CP or dyspnea; mildly productive cough  Inpatient Medications    Scheduled Meds: . aspirin EC  81 mg Oral Daily  . atorvastatin  80 mg Oral Daily  . fluticasone furoate-vilanterol  1 puff Inhalation Daily   And  . umeclidinium bromide  1 puff Inhalation Daily  . metoprolol tartrate  12.5 mg Oral BID  . nicotine  21 mg Transdermal Daily  . pantoprazole  40 mg Oral Daily  . [START ON 03/30/2021] predniSONE  40 mg Oral Q breakfast   Followed by  . [START ON 03/31/2021] predniSONE  30 mg Oral Q breakfast   Followed by  . [START ON 04/01/2021] predniSONE  20 mg Oral Q breakfast   Followed by  . [START ON 04/02/2021] predniSONE  10 mg Oral Q breakfast   Continuous Infusions: . diltiazem (CARDIZEM) infusion 15 mg/hr (03/29/21 0507)  . heparin 1,200 Units/hr (03/29/21 0115)   PRN Meds: acetaminophen, albuterol, HYDROcodone bit-homatropine, ondansetron **OR** ondansetron (ZOFRAN) IV   Vital Signs    Vitals:   03/28/21 2207 03/28/21 2208 03/29/21 0505 03/29/21 0827  BP: 114/80 114/80 117/73 117/84  Pulse: 100 (!) 111 91 100  Resp: 18 20 17 20   Temp: 98 F (36.7 C)  98 F (36.7 C) 97.8 F (36.6 C)  TempSrc: Oral  Oral Oral  SpO2: 96% 91% 95% 90%  Weight:        Intake/Output Summary (Last 24 hours) at 03/29/2021 1012 Last data filed at 03/29/2021 0827 Gross per 24 hour  Intake 1088.49 ml  Output 1071 ml  Net 17.49 ml   Last 3 Weights 03/28/2021 09/21/2018 04/10/2015  Weight (lbs) 125 lb 11.2 oz 133 lb 3.2 oz 146 lb  Weight (kg) 57.017 kg 60.419 kg 66.225 kg      Telemetry    Atrial fibrillation; rate upper normal to mildly increased - Personally Reviewed  Physical Exam   GEN: No acute distress.   Neck: No JVD Cardiac: irregular Respiratory: diminished BS throughout GI: Soft, nontender, non-distended  MS: No edema Neuro:   Nonfocal  Psych: Normal affect   Labs     Chemistry Recent Labs  Lab 03/27/21 1558 03/29/21 0415  NA 133* 138  K 4.9 4.7  CL 96* 108  CO2 29 25  GLUCOSE 100* 132*  BUN 13 15  CREATININE 0.78 0.79  CALCIUM 9.4 8.9  PROT  --  5.5*  ALBUMIN  --  2.9*  AST  --  20  ALT  --  30  ALKPHOS  --  59  BILITOT  --  0.6  GFRNONAA >60 >60  ANIONGAP 8 5     Hematology Recent Labs  Lab 03/27/21 1558 03/29/21 0415  WBC 9.3 6.3  RBC 5.28 4.51  HGB 16.6 14.0  HCT 50.3 41.9  MCV 95.3 92.9  MCH 31.4 31.0  MCHC 33.0 33.4  RDW 13.5 13.6  PLT 180 154    DDimer  Recent Labs  Lab 03/28/21 0443 03/29/21 0415  DDIMER 1.00* 0.90*     Radiology    DG Chest Port 1 View  Result Date: 03/27/2021 CLINICAL DATA:  History of COVID-19 positivity with shortness of breath EXAM: PORTABLE CHEST 1 VIEW COMPARISON:  10/03/2010 FINDINGS: Cardiac shadow is within normal limits. Aortic calcifications are noted. The lungs are hyperinflated with mild bibasilar atelectatic changes. No focal confluent infiltrate  is seen. No sizable effusion is noted. No bony abnormality is seen. IMPRESSION: Mild bibasilar atelectasis. Changes of COPD. Electronically Signed   By: Alcide Clever M.D.   On: 03/27/2021 18:27   ECHOCARDIOGRAM COMPLETE  Result Date: 03/28/2021    ECHOCARDIOGRAM REPORT   Patient Name:   [name] Date of Exam: 03/28/2021 Medical Rec #:  [id number]      Height:       69.0 in Accession #:    [id number]     Weight:       133.2 lb Date of Birth:  1956/07/06     BSA:          1.738 m Patient Age:    65 years       BP:           96/72 mmHg Patient Gender: M              HR:           111 bpm. Exam Location:  Inpatient Procedure: 2D Echo, Cardiac Doppler and Color Doppler Indications:    I48.0 Paroxysmal atrial fibrillation  History:        Patient has no prior history of Echocardiogram examinations.                 COPD; Risk Factors:Dyslipidemia, GERD and Current Smoker.                 COVID-19  Positive.  Sonographer:    Elmarie Shiley Dance Referring Phys: 36 [address] [place] IMPRESSIONS  1. Left ventricular ejection fraction, by estimation, is 55 to 60%. The left ventricle has normal function. The left ventricle demonstrates regional wall motion abnormalities (see scoring diagram/findings for description). Left ventricular diastolic parameters are indeterminate.  2. Right ventricular systolic function is normal. The right ventricular size is normal. There is normal pulmonary artery systolic pressure. The estimated right ventricular systolic pressure is 30.3 mmHg.  3. The mitral valve is grossly normal. Moderate mitral valve regurgitation - suspect mechanism is posterior leaflet restriction. No evidence of mitral stenosis.  4. The aortic valve is normal in structure. Aortic valve regurgitation is not visualized. No aortic stenosis is present.  5. The inferior vena cava is dilated in size with >50% respiratory variability, suggesting right atrial pressure of 8 mmHg. FINDINGS  Left Ventricle: Left ventricular ejection fraction, by estimation, is 55 to 60%. The left ventricle has normal function. The left ventricle demonstrates regional wall motion abnormalities. The left ventricular internal cavity size was normal in size. There is no left ventricular hypertrophy. Left ventricular diastolic parameters are indeterminate.  LV Wall Scoring: The posterior wall, basal anterolateral segment, and basal inferior segment are hypokinetic. Right Ventricle: The right ventricular size is normal. No increase in right ventricular wall thickness. Right ventricular systolic function is normal. There is normal pulmonary artery systolic pressure. The tricuspid regurgitant velocity is 2.36 m/s, and  with an assumed right atrial pressure of 8 mmHg, the estimated right ventricular systolic pressure is 30.3 mmHg. Left Atrium: Left atrial size was normal in size. Right Atrium: Right atrial size was normal in size. Pericardium: There  is no evidence of pericardial effusion. Mitral Valve: The mitral valve is grossly normal. Mild mitral annular calcification. Moderate mitral valve regurgitation. No evidence of mitral valve stenosis. Tricuspid Valve: The tricuspid valve is normal in structure. Tricuspid valve regurgitation is mild . No evidence of tricuspid stenosis. Aortic Valve: The aortic valve is normal in structure. Aortic valve regurgitation  is not visualized. No aortic stenosis is present. Pulmonic Valve: The pulmonic valve was normal in structure. Pulmonic valve regurgitation is trivial. No evidence of pulmonic stenosis. Aorta: The aortic root is normal in size and structure. Venous: The inferior vena cava is dilated in size with greater than 50% respiratory variability, suggesting right atrial pressure of 8 mmHg. IAS/Shunts: No atrial level shunt detected by color flow Doppler.  LEFT VENTRICLE PLAX 2D LVIDd:         3.80 cm LVIDs:         3.10 cm LV PW:         1.10 cm LV IVS:        0.90 cm LVOT diam:     2.00 cm LV SV:         44 LV SV Index:   25 LVOT Area:     3.14 cm  RIGHT VENTRICLE          IVC RV Basal diam:  2.50 cm  IVC diam: 2.60 cm TAPSE (M-mode): 1.6 cm LEFT ATRIUM             Index       RIGHT ATRIUM           Index LA diam:        3.70 cm 2.13 cm/m  RA Area:     12.70 cm LA Vol (A2C):   43.9 ml 25.26 ml/m RA Volume:   30.10 ml  17.32 ml/m LA Vol (A4C):   39.5 ml 22.72 ml/m LA Biplane Vol: 42.7 ml 24.57 ml/m  AORTIC VALVE LVOT Vmax:   75.45 cm/s LVOT Vmean:  50.450 cm/s LVOT VTI:    0.141 m  AORTA Ao Root diam: 3.60 cm MITRAL VALVE                TRICUSPID VALVE MV Area (PHT): 3.81 cm     TR Peak grad:   22.3 mmHg MV Decel Time: 199 msec     TR Vmax:        236.00 cm/s MV E velocity: 125.00 cm/s                             SHUNTS                             Systemic VTI:  0.14 m                             Systemic Diam: 2.00 cm Weston BrassGayatri Acharya MD Electronically signed by Weston BrassGayatri Acharya MD Signature Date/Time:  03/28/2021/3:27:33 PM    Final     Patient Profile     65 year old male with past medical history of peripheral vascular disease, COPD, hyperlipidemia, gastroesophageal reflux disease, tobacco abuse admitted COVID positive for evaluation of atrial fibrillation with rapid ventricular response.  Echocardiogram shows normal LV function, moderate mitral regurgitation.   Assessment & Plan    1 newly diagnosed atrial fibrillation with rapid ventricular response-patient remains in atrial fibrillation this morning.  We will change Cardizem to 90 mg by mouth every 6 hours and transition to CDU once it is clear blood pressure is stable.  Increase metoprolol to 25 mg twice daily.  Discontinue IV heparin.  Instead treat with apixaban 5 mg twice daily.  Note LV function is normal.  TSH is normal.  If he  does not convert we can plan cardioversion once he recovers from COVID.  2 peripheral vascular disease-continue statin.  3 hyperlipidemia-continue Lipitor 80 mg daily.  4 tobacco abuse-patient previously counseled on discontinuing.  5 COVID-Per hospitalist service.  For questions or updates, please contact [facility] Please consult [email or web address] for contact info under        Signed, Olga Millers, MD  03/29/2021, 10:12 AM

## 2021-03-29 NOTE — Progress Notes (Signed)
Mobility Specialist - Progress Note   03/29/21 1143  Mobility  Activity Contraindicated/medical hold   HR currently 141 at rest, will f/u as able.   Manuel Richardson Mobility Specialist Mobility Specialist Phone: [phone]

## 2021-03-29 NOTE — TOC Benefit Eligibility Note (Signed)
Transition of Care [facility]) Benefit Eligibility Note    Patient Details  Name: FUAD FORGET MRN: [id number] Date of Birth: [birth year]   Medication/Dose: Laurena Spies  5 MG BID  Covered?: Yes  Tier: 3 Drug  Prescription Coverage [facility]  Spoke with Person/Company/Phone Number:: KAREN @ [employer] #  [phone]  Co-Pay: $40.00  Prior Approval: No  Deductible: Met (OUT-OF-POCKET:UNMET)  Additional Notes: XARELTO 15 MG BID COVER-YES CO-PAY- $40.00   ,  XARELTO 20 MG DAILY COVER-YES CO-PAY- $40.00  TIER- 3 DRUG P/A -NO   XARELTO $Remove'10MG'[id number]$  : Crecencio Mc Phone Number: 03/29/2021, 5:27 PM

## 2021-03-30 LAB — COMPREHENSIVE METABOLIC PANEL
ALT: 44 U/L (ref 0–44)
AST: 36 U/L (ref 15–41)
Albumin: 3.1 g/dL — ABNORMAL LOW (ref 3.5–5.0)
Alkaline Phosphatase: 66 U/L (ref 38–126)
Anion gap: 5 (ref 5–15)
BUN: 22 mg/dL (ref 8–23)
CO2: 27 mmol/L (ref 22–32)
Calcium: 9.2 mg/dL (ref 8.9–10.3)
Chloride: 105 mmol/L (ref 98–111)
Creatinine, Ser: 0.83 mg/dL (ref 0.61–1.24)
GFR, Estimated: >60 mL/min (ref >60)
Glucose, Bld: 129 mg/dL — ABNORMAL HIGH (ref 70–99)
Potassium: 5.1 mmol/L (ref 3.5–5.1)
Sodium: 137 mmol/L (ref 135–145)
Total Bilirubin: 0.5 mg/dL (ref 0.3–1.2)
Total Protein: 6.1 g/dL — ABNORMAL LOW (ref 6.5–8.1)

## 2021-03-30 LAB — CBC WITH DIFFERENTIAL/PLATELET
Abs Immature Granulocytes: 0.04 10*3/uL (ref 0.00–0.07)
Basophils Absolute: 0 10*3/uL (ref 0.0–0.1)
Basophils Relative: 0 %
Eosinophils Absolute: 0 10*3/uL (ref 0.0–0.5)
Eosinophils Relative: 0 %
HCT: 44.2 % (ref 39.0–52.0)
Hemoglobin: 14.5 g/dL (ref 13.0–17.0)
Immature Granulocytes: 1 %
Lymphocytes Relative: 9 %
Lymphs Abs: 0.8 10*3/uL (ref 0.7–4.0)
MCH: 30.7 pg (ref 26.0–34.0)
MCHC: 32.8 g/dL (ref 30.0–36.0)
MCV: 93.6 fL (ref 80.0–100.0)
Monocytes Absolute: 0.3 10*3/uL (ref 0.1–1.0)
Monocytes Relative: 4 %
Neutro Abs: 7.3 10*3/uL (ref 1.7–7.7)
Neutrophils Relative %: 86 %
Platelets: 214 10*3/uL (ref 150–400)
RBC: 4.72 MIL/uL (ref 4.22–5.81)
RDW: 13.4 % (ref 11.5–15.5)
WBC: 8.5 10*3/uL (ref 4.0–10.5)
nRBC: 0 % (ref 0.0–0.2)

## 2021-03-30 MED ORDER — METOPROLOL TARTRATE 50 MG PO TABS
50.0000 mg | ORAL_TABLET | Freq: Two times a day (BID) | ORAL | Status: DC
  Administered 2021-03-30 – 2021-04-01 (×4): 50 mg via ORAL
  Filled 2021-03-30 (×4): qty 1

## 2021-03-30 MED ORDER — METOPROLOL TARTRATE 25 MG PO TABS
25.0000 mg | ORAL_TABLET | Freq: Once | ORAL | Status: AC
  Administered 2021-03-30: 25 mg via ORAL
  Filled 2021-03-30: qty 1

## 2021-03-30 MED ORDER — DILTIAZEM HCL ER COATED BEADS 180 MG PO CP24
360.0000 mg | ORAL_CAPSULE | Freq: Every day | ORAL | Status: DC
  Administered 2021-03-30 – 2021-04-01 (×3): 360 mg via ORAL
  Filled 2021-03-30 (×3): qty 2

## 2021-03-30 NOTE — Progress Notes (Signed)
Progress Note  Patient Name: Manuel Richardson Date of Encounter: 03/30/2021  [facility] Cardiologist: New; Dr Jens Som  Subjective   Pt denies CP or dyspnea  Inpatient Medications    Scheduled Meds: . apixaban  5 mg Oral BID  . aspirin EC  81 mg Oral Daily  . atorvastatin  80 mg Oral Daily  . diltiazem  90 mg Oral Q6H  . fluticasone furoate-vilanterol  1 puff Inhalation Daily   And  . umeclidinium bromide  1 puff Inhalation Daily  . Ipratropium-Albuterol  1 puff Inhalation Q6H  . methylPREDNISolone (SOLU-MEDROL) injection  60 mg Intravenous Q8H  . metoprolol tartrate  25 mg Oral BID  . nicotine  21 mg Transdermal Daily  . pantoprazole  40 mg Oral Daily   Continuous Infusions: . remdesivir 100 mg in NS 100 mL     PRN Meds: acetaminophen, albuterol, HYDROcodone bit-homatropine, ondansetron **OR** ondansetron (ZOFRAN) IV   Vital Signs    Vitals:   03/29/21 2340 03/29/21 2345 03/30/21 0417 03/30/21 0824  BP: (!) 135/99 (!) 135/99 115/80 115/71  Pulse:  (!) 104 86 (!) 103  Resp:  20 18 18   Temp:  98.1 F (36.7 C) 98.1 F (36.7 C) 97.7 F (36.5 C)  TempSrc:  Oral Oral Oral  SpO2:  96% 96% 95%  Weight:        Intake/Output Summary (Last 24 hours) at 03/30/2021 0903 Last data filed at 03/30/2021 0422 Gross per 24 hour  Intake 720 ml  Output 1040 ml  Net -320 ml   Last 3 Weights 03/28/2021 09/21/2018 04/10/2015  Weight (lbs) 125 lb 11.2 oz 133 lb 3.2 oz 146 lb  Weight (kg) 57.017 kg 60.419 kg 66.225 kg      Telemetry    Atrial fibrillation; rate upper normal to mildly increased - Personally Reviewed  Physical Exam   Not performed as patient as patient COVID-positive.  By phone patient has normal affect, answers questions appropriately and is in no acute distress.  Labs     Chemistry Recent Labs  Lab 03/27/21 1558 03/29/21 0415 03/30/21 0208  NA 133* 138 137  K 4.9 4.7 5.1  CL 96* 108 105  CO2 29 25 27   GLUCOSE 100* 132* 129*  BUN 13 15 22    CREATININE 0.78 0.79 0.83  CALCIUM 9.4 8.9 9.2  PROT  --  5.5* 6.1*  ALBUMIN  --  2.9* 3.1*  AST  --  20 36  ALT  --  30 44  ALKPHOS  --  59 66  BILITOT  --  0.6 0.5  GFRNONAA >60 >60 >60  ANIONGAP 8 5 5      Hematology Recent Labs  Lab 03/27/21 1558 03/29/21 0415 03/30/21 0208  WBC 9.3 6.3 8.5  RBC 5.28 4.51 4.72  HGB 16.6 14.0 14.5  HCT 50.3 41.9 44.2  MCV 95.3 92.9 93.6  MCH 31.4 31.0 30.7  MCHC 33.0 33.4 32.8  RDW 13.5 13.6 13.4  PLT 180 154 214    DDimer  Recent Labs  Lab 03/28/21 0443 03/29/21 0415  DDIMER 1.00* 0.90*     Radiology    ECHOCARDIOGRAM COMPLETE  Result Date: 03/28/2021    ECHOCARDIOGRAM REPORT   Patient Name:   Manuel Richardson Date of Exam: 03/28/2021 Medical Rec #:  [id number]      Height:       69.0 in Accession #:    [id number]     Weight:       133.2  lb Date of Birth:  04-04-1956     BSA:          1.738 m Patient Age:    64 years       BP:           96/72 mmHg Patient Gender: M              HR:           111 bpm. Exam Location:  Inpatient Procedure: 2D Echo, Cardiac Doppler and Color Doppler Indications:    I48.0 Paroxysmal atrial fibrillation  History:        Patient has no prior history of Echocardiogram examinations.                 COPD; Risk Factors:Dyslipidemia, GERD and Current Smoker.                 COVID-19 Positive.  Sonographer:    Elmarie Shiley Dance Referring Phys: 25 DAWOOD S ELGER[place]WY IMPRESSIONS  1. Left ventricular ejection fraction, by estimation, is 55 to 60%. The left ventricle has normal function. The left ventricle demonstrates regional wall motion abnormalities (see scoring diagram/findings for description). Left ventricular diastolic parameters are indeterminate.  2. Right ventricular systolic function is normal. The right ventricular size is normal. There is normal pulmonary artery systolic pressure. The estimated right ventricular systolic pressure is 30.3 mmHg.  3. The mitral valve is grossly normal. Moderate mitral valve  regurgitation - suspect mechanism is posterior leaflet restriction. No evidence of mitral stenosis.  4. The aortic valve is normal in structure. Aortic valve regurgitation is not visualized. No aortic stenosis is present.  5. The inferior vena cava is dilated in size with >50% respiratory variability, suggesting right atrial pressure of 8 mmHg. FINDINGS  Left Ventricle: Left ventricular ejection fraction, by estimation, is 55 to 60%. The left ventricle has normal function. The left ventricle demonstrates regional wall motion abnormalities. The left ventricular internal cavity size was normal in size. There is no left ventricular hypertrophy. Left ventricular diastolic parameters are indeterminate.  LV Wall Scoring: The posterior wall, basal anterolateral segment, and basal inferior segment are hypokinetic. Right Ventricle: The right ventricular size is normal. No increase in right ventricular wall thickness. Right ventricular systolic function is normal. There is normal pulmonary artery systolic pressure. The tricuspid regurgitant velocity is 2.36 m/s, and  with an assumed right atrial pressure of 8 mmHg, the estimated right ventricular systolic pressure is 30.3 mmHg. Left Atrium: Left atrial size was normal in size. Right Atrium: Right atrial size was normal in size. Pericardium: There is no evidence of pericardial effusion. Mitral Valve: The mitral valve is grossly normal. Mild mitral annular calcification. Moderate mitral valve regurgitation. No evidence of mitral valve stenosis. Tricuspid Valve: The tricuspid valve is normal in structure. Tricuspid valve regurgitation is mild . No evidence of tricuspid stenosis. Aortic Valve: The aortic valve is normal in structure. Aortic valve regurgitation is not visualized. No aortic stenosis is present. Pulmonic Valve: The pulmonic valve was normal in structure. Pulmonic valve regurgitation is trivial. No evidence of pulmonic stenosis. Aorta: The aortic root is normal in  size and structure. Venous: The inferior vena cava is dilated in size with greater than 50% respiratory variability, suggesting right atrial pressure of 8 mmHg. IAS/Shunts: No atrial level shunt detected by color flow Doppler.  LEFT VENTRICLE PLAX 2D LVIDd:         3.80 cm LVIDs:         3.10 cm  LV PW:         1.10 cm LV IVS:        0.90 cm LVOT diam:     2.00 cm LV SV:         44 LV SV Index:   25 LVOT Area:     3.14 cm  RIGHT VENTRICLE          IVC RV Basal diam:  2.50 cm  IVC diam: 2.60 cm TAPSE (M-mode): 1.6 cm LEFT ATRIUM             Index       RIGHT ATRIUM           Index LA diam:        3.70 cm 2.13 cm/m  RA Area:     12.70 cm LA Vol (A2C):   43.9 ml 25.26 ml/m RA Volume:   30.10 ml  17.32 ml/m LA Vol (A4C):   39.5 ml 22.72 ml/m LA Biplane Vol: 42.7 ml 24.57 ml/m  AORTIC VALVE LVOT Vmax:   75.45 cm/s LVOT Vmean:  50.450 cm/s LVOT VTI:    0.141 m  AORTA Ao Root diam: 3.60 cm MITRAL VALVE                TRICUSPID VALVE MV Area (PHT): 3.81 cm     TR Peak grad:   22.3 mmHg MV Decel Time: 199 msec     TR Vmax:        236.00 cm/s MV E velocity: 125.00 cm/s                             SHUNTS                             Systemic VTI:  0.14 m                             Systemic Diam: 2.00 cm Weston Brass MD Electronically signed by Weston Brass MD Signature Date/Time: 03/28/2021/3:27:33 PM    Final     Patient Profile     65 year old male with past medical history of peripheral vascular disease, COPD, hyperlipidemia, gastroesophageal reflux disease, tobacco abuse admitted COVID positive for evaluation of atrial fibrillation with rapid ventricular response.  Echocardiogram shows normal LV function, moderate mitral regurgitation.   Assessment & Plan    1 newly diagnosed atrial fibrillation with rapid ventricular response-patient remains in atrial fibrillation on telemetry but rate is relatively well controlled.  Transition Cardizem to 360 mg daily.  Continue metoprolol at present dose.  Continue  apixaban.  We will arrange follow-up in our office in 2 to 4 weeks.  If patient remains in atrial fibrillation at time will likely plan to proceed with cardioversion at that time.  Atrial fibrillation likely driven by COVID.  Note LV function is normal.  TSH is normal.   2 peripheral vascular disease-continue statin.  3 hyperlipidemia-continue Lipitor 80 mg daily.  Check lipids and liver in 12 weeks.  4 tobacco abuse-patient previously counseled on discontinuing.  5 COVID-Per hospitalist service.  Patient can be discharged from a cardiac standpoint.  Continue present medications.  For questions or updates, please contact [facility] Please consult [email or web address] for contact info under        Signed, Olga Millers, MD  03/30/2021, 9:03 AM

## 2021-03-30 NOTE — Discharge Instructions (Signed)

## 2021-03-30 NOTE — Progress Notes (Signed)
Mobility Specialist - Progress Note   03/30/21 1445  Mobility  Activity Contraindicated/medical hold   Pt's HR currently jumping between 129-165. Will f/u as able.   [name] Mobility Specialist Mobility Specialist Phone: [phone]

## 2021-03-30 NOTE — Progress Notes (Signed)
PROGRESS NOTE    Manuel Richardson  WFU:[id number] DOB: 10-Aug-1956 DOA: 03/27/2021 PCP: Charlane Ferretti, DO   Chief Complaint  Patient presents with  . Shortness of Breath    Brief Narrative:    Manuel Richardson is a 65 y.o. male with medical history significant of COPD with emphysema, ongoing smoking, HTN. Pt unvaccinated to COVID.  Pt went to beach with family last week.  Came back, started feeling ill.  Diagnosed with COVID this past week and started on prednisone taper and Z-pak as outpt.  Symptoms of moderate SOB and cough have persisted so presents to ED this evening.  No CP. Patient presented secondary to shortness of breath, work-up significant for A. fib with RVR, and COVID-19 infection.    Assessment & Plan:   Principal Problem:   Atrial fibrillation with RVR (HCC) Active Problems:   Essential hypertension, benign   COPD with emphysema (HCC)   COVID-19 virus infection   New onset A.Fib RVR  New onset, management by cardiology, initially on Cardizem drip, started on low-dose metoprolol as well, heart rate in the 120s this morning, he is converted to p.o. Cardizem, and his metoprolol dose has been increased as well. -TSH within normal limit, -2D echo with normal LV function -On heparin GTT T for anticoagulation, will transition to Eliquis -Per cardiology if he does not convert, then plan cardioversion once he recovers from COVID -Cardizem consolidated to 36 mg daily, continue metoprolol at current dose, he is on apixaban.  COVID-19 infection -He received monoclonal therapy in ED -He does report dyspnea with activity, he is on 2 L nasal cannula this morning, he did dip to the 88% once weaned off oxygen. -Continue with IV steroids, and IV remdesivir, he was encouraged use incentive spirometer and flutter valve.   COPD exacerbation -Unclear if his exertional dyspnea,and  hypoxia related to COVID or COPD exacerbation , there is already evidence of COPD on his  chest x-ray, but he is treated with IV steroids, will start on scheduled Combivent as well.  Tobacco abuse -He was counseled  HTN  -He is currently on Cardizem and metoprolol    DVT prophylaxis: Heparin GTT>Eliquis Code Status: Full Family Communication: Discussed with sister-in-law by phone " cricket "6/1 Disposition:   Status is: Observation  The patient will require care spanning > 2 midnights and should be moved to inpatient because: IV treatments appropriate due to intensity of illness or inability to take PO  Dispo: The patient is from: Home              Anticipated d/c is to: Home              Patient currently is not medically stable to d/c.     Difficult to place patient No       Consultants:   Cardiology   Subjective:  Report he is feeling better, dyspnea has improved, still present with activity, denies any chest pain, fever or chills.   Objective: Vitals:   03/29/21 2340 03/29/21 2345 03/30/21 0417 03/30/21 0824  BP: (!) 135/99 (!) 135/99 115/80 115/71  Pulse:  (!) 104 86 (!) 103  Resp:  20 18 18   Temp:  98.1 F (36.7 C) 98.1 F (36.7 C) 97.7 F (36.5 C)  TempSrc:  Oral Oral Oral  SpO2:  96% 96% 95%  Weight:        Intake/Output Summary (Last 24 hours) at 03/30/2021 1249 Last data filed at 03/30/2021 0422 Gross per 24 hour  Intake 480 ml  Output 1040 ml  Net -560 ml   Filed Weights   03/28/21 1406  Weight: 57 kg    Examination:  Awake Alert, Oriented X 3, No new F.N deficits, Normal affect, frail Symmetrical Chest wall movement, Good air movement bilaterally, CTAB Irregular irregular, no Gallops,Rubs or new Murmurs, No Parasternal Heave +ve B.Sounds, Abd Soft, No tenderness, No rebound - guarding or rigidity. No Cyanosis, Clubbing or edema, No new Rash or bruise       Data Reviewed: I have personally reviewed following labs and imaging studies  CBC: Recent Labs  Lab 03/27/21 1558 03/29/21 0415 03/30/21 0208  WBC 9.3 6.3 8.5   NEUTROABS 7.3 3.7 7.3  HGB 16.6 14.0 14.5  HCT 50.3 41.9 44.2  MCV 95.3 92.9 93.6  PLT 180 154 214    Basic Metabolic Panel: Recent Labs  Lab 03/27/21 1558 03/29/21 0415 03/30/21 0208  NA 133* 138 137  K 4.9 4.7 5.1  CL 96* 108 105  CO2 29 25 27   GLUCOSE 100* 132* 129*  BUN 13 15 22   CREATININE 0.78 0.79 0.83  CALCIUM 9.4 8.9 9.2    GFR: CrCl cannot be calculated (Unknown ideal weight.).  Liver Function Tests: Recent Labs  Lab 03/29/21 0415 03/30/21 0208  AST 20 36  ALT 30 44  ALKPHOS 59 66  BILITOT 0.6 0.5  PROT 5.5* 6.1*  ALBUMIN 2.9* 3.1*    CBG: No results for input(s): GLUCAP in the last 168 hours.   Recent Results (from the past 240 hour(s))  Resp Panel by RT-PCR (Flu A&B, Covid) Nasopharyngeal Swab     Status: Abnormal   Collection Time: 03/28/21  4:03 AM   Specimen: Nasopharyngeal Swab; Nasopharyngeal(NP) swabs in vial transport medium  Result Value Ref Range Status   SARS Coronavirus 2 by RT PCR POSITIVE (A) NEGATIVE Final    Comment: RESULT CALLED TO, READ BACK BY AND VERIFIED WITH: RN GRACE TATE BY MESSAN H. AT 05/30/21 ON 03/28/2021 (NOTE) SARS-CoV-2 target nucleic acids are DETECTED.  The SARS-CoV-2 RNA is generally detectable in upper respiratory specimens during the acute phase of infection. Positive results are indicative of the presence of the identified virus, but do not rule out bacterial infection or co-infection with other pathogens not detected by the test. Clinical correlation with patient history and other diagnostic information is necessary to determine patient infection status. The expected result is Negative.  Fact Sheet for Patients: 1700  Fact Sheet for Healthcare Providers: 05/28/2021  This test is not yet approved or cleared by the [employer] FDA and  has been authorized for detection and/or diagnosis of SARS-CoV-2 by FDA under an Emergency Use  Authorization (EUA).  This EUA will remain in effect (meaning thi s test can be used) for the duration of  the COVID-19 declaration under Section 564(b)(1) of the Act, 21 U.S.C. section 360bbb-3(b)(1), unless the authorization is terminated or revoked sooner.     Influenza A by PCR NEGATIVE NEGATIVE Final   Influenza B by PCR NEGATIVE NEGATIVE Final    Comment: (NOTE) The Xpert Xpress SARS-CoV-2/FLU/RSV plus assay is intended as an aid in the diagnosis of influenza from Nasopharyngeal swab specimens and should not be used as a sole basis for treatment. Nasal washings and aspirates are unacceptable for Xpert Xpress SARS-CoV-2/FLU/RSV testing.  Fact Sheet for Patients: SeriousBroker.it  Fact Sheet for Healthcare Providers: [place]  This test is not yet approved or cleared by the [employer] FDA and has been  authorized for detection and/or diagnosis of SARS-CoV-2 by FDA under an Emergency Use Authorization (EUA). This EUA will remain in effect (meaning this test can be used) for the duration of the COVID-19 declaration under Section 564(b)(1) of the Act, 21 U.S.C. section 360bbb-3(b)(1), unless the authorization is terminated or revoked.  Performed at [facility], [address], [place], [place] [address]          Radiology Studies: No results found.      Scheduled Meds: . apixaban  5 mg Oral BID  . aspirin EC  81 mg Oral Daily  . atorvastatin  80 mg Oral Daily  . diltiazem  360 mg Oral Daily  . fluticasone furoate-vilanterol  1 puff Inhalation Daily   And  . umeclidinium bromide  1 puff Inhalation Daily  . Ipratropium-Albuterol  1 puff Inhalation Q6H  . methylPREDNISolone (SOLU-MEDROL) injection  60 mg Intravenous Q8H  . metoprolol tartrate  25 mg Oral BID  . nicotine  21 mg Transdermal Daily  . pantoprazole  40 mg Oral Daily   Continuous Infusions: . remdesivir 100 mg in NS 100 mL 100 mg  (03/30/21 1012)     LOS: 1 day     Huey Bienenstock, MD [facility]   To contact the attending provider between 7A-7P or the covering provider during after hours 7P-7A, please log into the web site [email or web address] and access using universal [place] password for that web site. If you do not have the password, please call the hospital operator.  03/30/2021, 12:49 PM

## 2021-03-31 LAB — CBC WITH DIFFERENTIAL/PLATELET
Abs Immature Granulocytes: 0.07 10*3/uL (ref 0.00–0.07)
Basophils Absolute: 0 10*3/uL (ref 0.0–0.1)
Basophils Relative: 0 %
Eosinophils Absolute: 0 10*3/uL (ref 0.0–0.5)
Eosinophils Relative: 0 %
HCT: 39.8 % (ref 39.0–52.0)
Hemoglobin: 13.5 g/dL (ref 13.0–17.0)
Immature Granulocytes: 1 %
Lymphocytes Relative: 7 %
Lymphs Abs: 0.8 10*3/uL (ref 0.7–4.0)
MCH: 31.5 pg (ref 26.0–34.0)
MCHC: 33.9 g/dL (ref 30.0–36.0)
MCV: 92.8 fL (ref 80.0–100.0)
Monocytes Absolute: 0.5 10*3/uL (ref 0.1–1.0)
Monocytes Relative: 4 %
Neutro Abs: 9.5 10*3/uL — ABNORMAL HIGH (ref 1.7–7.7)
Neutrophils Relative %: 88 %
Platelets: 202 10*3/uL (ref 150–400)
RBC: 4.29 MIL/uL (ref 4.22–5.81)
RDW: 13.5 % (ref 11.5–15.5)
WBC: 10.8 10*3/uL — ABNORMAL HIGH (ref 4.0–10.5)
nRBC: 0 % (ref 0.0–0.2)

## 2021-03-31 LAB — COMPREHENSIVE METABOLIC PANEL
ALT: 61 U/L — ABNORMAL HIGH (ref 0–44)
AST: 43 U/L — ABNORMAL HIGH (ref 15–41)
Albumin: 2.8 g/dL — ABNORMAL LOW (ref 3.5–5.0)
Alkaline Phosphatase: 60 U/L (ref 38–126)
Anion gap: 7 (ref 5–15)
BUN: 32 mg/dL — ABNORMAL HIGH (ref 8–23)
CO2: 26 mmol/L (ref 22–32)
Calcium: 8.7 mg/dL — ABNORMAL LOW (ref 8.9–10.3)
Chloride: 102 mmol/L (ref 98–111)
Creatinine, Ser: 0.87 mg/dL (ref 0.61–1.24)
GFR, Estimated: >60 mL/min (ref >60)
Glucose, Bld: 170 mg/dL — ABNORMAL HIGH (ref 70–99)
Potassium: 4.9 mmol/L (ref 3.5–5.1)
Sodium: 135 mmol/L (ref 135–145)
Total Bilirubin: 0.6 mg/dL (ref 0.3–1.2)
Total Protein: 5.4 g/dL — ABNORMAL LOW (ref 6.5–8.1)

## 2021-03-31 MED ORDER — METHYLPREDNISOLONE SODIUM SUCC 40 MG IJ SOLR
40.0000 mg | Freq: Three times a day (TID) | INTRAMUSCULAR | Status: DC
  Administered 2021-03-31 – 2021-04-01 (×3): 40 mg via INTRAVENOUS
  Filled 2021-03-31 (×3): qty 1

## 2021-03-31 MED ORDER — IPRATROPIUM-ALBUTEROL 20-100 MCG/ACT IN AERS
1.0000 | INHALATION_SPRAY | Freq: Three times a day (TID) | RESPIRATORY_TRACT | Status: DC
  Administered 2021-04-01 (×2): 1 via RESPIRATORY_TRACT
  Filled 2021-03-31: qty 4

## 2021-03-31 NOTE — Progress Notes (Signed)
Pt able to ambulate in the room and sit up for meal and go to restroom without oxygen. HR: 108-114 and sat's: 90-92%. Pt has remained at 92% without oxygen sitting up in bed and sitting up with meals on the edge of the bed and his chair. Will continue to monitor.

## 2021-03-31 NOTE — Progress Notes (Signed)
PROGRESS NOTE    Manuel Richardson  XLK:[id number] DOB: [birth year] DOA: 03/27/2021 PCP: Charlane Ferretti, DO   Chief Complaint  Patient presents with  . Shortness of Breath    Brief Narrative:    Manuel Richardson is a 65 y.o. male with medical history significant of COPD with emphysema, ongoing smoking, HTN. Pt unvaccinated to COVID.  Pt went to beach with family last week.  Came back, started feeling ill.  Diagnosed with COVID this past week and started on prednisone taper and Z-pak as outpt.  Symptoms of moderate SOB and cough have persisted so presents to ED this evening.  No CP. Patient presented secondary to shortness of breath, work-up significant for A. fib with RVR, and COVID-19 infection.    Assessment & Plan:   Principal Problem:   Atrial fibrillation with RVR (HCC) Active Problems:   Essential hypertension, benign   COPD with emphysema (HCC)   COVID-19 virus infection   New onset A.Fib RVR  New onset, management by cardiology, initially on Cardizem drip. -Currently transitioned to oral regimen, heart rate uncontrolled, so has increased his metoprolol to 50 mg p.o. twice daily, has improved at rest, will try to ambulate today, as well continue with Cardizem CD 360 mg oral daily.  -TSH within normal limit, -2D echo with normal LV function -On heparin GTT T for anticoagulation, will transition to Eliquis -Per cardiology if he does not convert, then plan cardioversion once he recovers from COVID -Cardizem consolidated to 36 mg daily, continue metoprolol at current dose, he is on apixaban.  COVID-19 infection -He received monoclonal therapy in ED -He does report dyspnea with activity, he is on 2 L nasal cannula this morning, he did dip to the 88% once weaned off oxygen. -Continue with IV steroids, and IV remdesivir, he was encouraged use incentive spirometer and flutter valve.   COPD exacerbation -Unclear if his exertional dyspnea,and  hypoxia related to COVID  or COPD exacerbation , there is already evidence of COPD on his chest x-ray, but he is treated with IV steroids, will start on scheduled Combivent as well.  Tobacco abuse -He was counseled  HTN  -He is currently on Cardizem and metoprolol    DVT prophylaxis: Heparin GTT>Eliquis Code Status: Full Family Communication: Discussed with sister-in-law by phone " cricket "6/1 Disposition:   Status is: Observation  The patient will require care spanning > 2 midnights and should be moved to inpatient because: IV treatments appropriate due to intensity of illness or inability to take PO  Dispo: The patient is from: Home              Anticipated d/c is to: Home              Patient currently is not medically stable to d/c.     Difficult to place patient No       Consultants:   Cardiology   Subjective:  Reports dyspnea has improved, heart rate was elevated with activity yesterday to 130s and 140s, but appears to be improving overnight, denies any chest pain.    Objective: Vitals:   03/30/21 1949 03/30/21 1951 03/30/21 2358 03/31/21 0527  BP: 139/79  115/80 100/65  Pulse: (!) 107  95 74  Resp: 20 18 20 16   Temp: 98.1 F (36.7 C)  98.5 F (36.9 C) 98.2 F (36.8 C)  TempSrc: Oral  Oral Oral  SpO2: 94%  94% 95%  Weight:        Intake/Output Summary (Last  24 hours) at 03/31/2021 1144 Last data filed at 03/31/2021 0355 Gross per 24 hour  Intake 340 ml  Output 905 ml  Net -565 ml   Filed Weights   03/28/21 1406  Weight: 57 kg    Examination:  Awake Alert, Oriented X 3, No new F.N deficits, Normal affect Symmetrical Chest wall movement, Good air movement bilaterally, CTAB Irregular irregular,No Gallops,Rubs or new Murmurs, No Parasternal Heave +ve B.Sounds, Abd Soft, No tenderness, No rebound - guarding or rigidity. No Cyanosis, Clubbing or edema, No new Rash or bruise        Data Reviewed: I have personally reviewed following labs and imaging  studies  CBC: Recent Labs  Lab 03/27/21 1558 03/29/21 0415 03/30/21 0208 03/31/21 0128  WBC 9.3 6.3 8.5 10.8*  NEUTROABS 7.3 3.7 7.3 9.5*  HGB 16.6 14.0 14.5 13.5  HCT 50.3 41.9 44.2 39.8  MCV 95.3 92.9 93.6 92.8  PLT 180 154 214 202    Basic Metabolic Panel: Recent Labs  Lab 03/27/21 1558 03/29/21 0415 03/30/21 0208 03/31/21 0128  NA 133* 138 137 135  K 4.9 4.7 5.1 4.9  CL 96* 108 105 102  CO2 29 25 27 26   GLUCOSE 100* 132* 129* 170*  BUN 13 15 22  32*  CREATININE 0.78 0.79 0.83 0.87  CALCIUM 9.4 8.9 9.2 8.7*    GFR: CrCl cannot be calculated (Unknown ideal weight.).  Liver Function Tests: Recent Labs  Lab 03/29/21 0415 03/30/21 0208 03/31/21 0128  AST 20 36 43*  ALT 30 44 61*  ALKPHOS 59 66 60  BILITOT 0.6 0.5 0.6  PROT 5.5* 6.1* 5.4*  ALBUMIN 2.9* 3.1* 2.8*    CBG: No results for input(s): GLUCAP in the last 168 hours.   Recent Results (from the past 240 hour(s))  Resp Panel by RT-PCR (Flu A&B, Covid) Nasopharyngeal Swab     Status: Abnormal   Collection Time: 03/28/21  4:03 AM   Specimen: Nasopharyngeal Swab; Nasopharyngeal(NP) swabs in vial transport medium  Result Value Ref Range Status   SARS Coronavirus 2 by RT PCR POSITIVE (A) NEGATIVE Final    Comment: RESULT CALLED TO, READ BACK BY AND VERIFIED WITH: RN GRACE TATE BY MESSAN H. AT 05/31/21 ON 03/28/2021 (NOTE) SARS-CoV-2 target nucleic acids are DETECTED.  The SARS-CoV-2 RNA is generally detectable in upper respiratory specimens during the acute phase of infection. Positive results are indicative of the presence of the identified virus, but do not rule out bacterial infection or co-infection with other pathogens not detected by the test. Clinical correlation with patient history and other diagnostic information is necessary to determine patient infection status. The expected result is Negative.  Fact Sheet for Patients: 3382  Fact Sheet for Healthcare  Providers: 05/28/2021  This test is not yet approved or cleared by the [employer] FDA and  has been authorized for detection and/or diagnosis of SARS-CoV-2 by FDA under an Emergency Use Authorization (EUA).  This EUA will remain in effect (meaning thi s test can be used) for the duration of  the COVID-19 declaration under Section 564(b)(1) of the Act, 21 U.S.C. section 360bbb-3(b)(1), unless the authorization is terminated or revoked sooner.     Influenza A by PCR NEGATIVE NEGATIVE Final   Influenza B by PCR NEGATIVE NEGATIVE Final    Comment: (NOTE) The Xpert Xpress SARS-CoV-2/FLU/RSV plus assay is intended as an aid in the diagnosis of influenza from Nasopharyngeal swab specimens and should not be used as a sole basis for treatment.  Nasal washings and aspirates are unacceptable for Xpert Xpress SARS-CoV-2/FLU/RSV testing.  Fact Sheet for Patients: BloggerCourse.com  Fact Sheet for Healthcare Providers: SeriousBroker.it  This test is not yet approved or cleared by the [place] FDA and has been authorized for detection and/or diagnosis of SARS-CoV-2 by FDA under an Emergency Use Authorization (EUA). This EUA will remain in effect (meaning this test can be used) for the duration of the COVID-19 declaration under Section 564(b)(1) of the Act, 21 U.S.C. section 360bbb-3(b)(1), unless the authorization is terminated or revoked.  Performed at [facility], [address] [place], [place] [address]          Radiology Studies: No results found.      Scheduled Meds: . apixaban  5 mg Oral BID  . aspirin EC  81 mg Oral Daily  . atorvastatin  80 mg Oral Daily  . diltiazem  360 mg Oral Daily  . fluticasone furoate-vilanterol  1 puff Inhalation Daily   And  . umeclidinium bromide  1 puff Inhalation Daily  . Ipratropium-Albuterol  1 puff Inhalation Q6H  . methylPREDNISolone  (SOLU-MEDROL) injection  60 mg Intravenous Q8H  . metoprolol tartrate  50 mg Oral BID  . nicotine  21 mg Transdermal Daily  . pantoprazole  40 mg Oral Daily   Continuous Infusions: . remdesivir 100 mg in NS 100 mL 100 mg (03/31/21 1029)     LOS: 2 days     Huey Bienenstock, MD [facility]   To contact the attending provider between 7A-7P or the covering provider during after hours 7P-7A, please log into the web site [email or web address] and access using universal [place] password for that web site. If you do not have the password, please call the hospital operator.  03/31/2021, 11:44 AM

## 2021-04-01 ENCOUNTER — Encounter (HOSPITAL_COMMUNITY): Payer: Self-pay | Admitting: Internal Medicine

## 2021-04-01 LAB — CBC WITH DIFFERENTIAL/PLATELET
Abs Immature Granulocytes: 0.08 10*3/uL — ABNORMAL HIGH (ref 0.00–0.07)
Basophils Absolute: 0 10*3/uL (ref 0.0–0.1)
Basophils Relative: 0 %
Eosinophils Absolute: 0 10*3/uL (ref 0.0–0.5)
Eosinophils Relative: 0 %
HCT: 39.4 % (ref 39.0–52.0)
Hemoglobin: 13.3 g/dL (ref 13.0–17.0)
Immature Granulocytes: 1 %
Lymphocytes Relative: 5 %
Lymphs Abs: 0.6 10*3/uL — ABNORMAL LOW (ref 0.7–4.0)
MCH: 31.7 pg (ref 26.0–34.0)
MCHC: 33.8 g/dL (ref 30.0–36.0)
MCV: 94 fL (ref 80.0–100.0)
Monocytes Absolute: 0.4 10*3/uL (ref 0.1–1.0)
Monocytes Relative: 3 %
Neutro Abs: 10.5 10*3/uL — ABNORMAL HIGH (ref 1.7–7.7)
Neutrophils Relative %: 91 %
Platelets: 226 10*3/uL (ref 150–400)
RBC: 4.19 MIL/uL — ABNORMAL LOW (ref 4.22–5.81)
RDW: 13.5 % (ref 11.5–15.5)
WBC: 11.6 10*3/uL — ABNORMAL HIGH (ref 4.0–10.5)
nRBC: 0 % (ref 0.0–0.2)

## 2021-04-01 LAB — COMPREHENSIVE METABOLIC PANEL
ALT: 81 U/L — ABNORMAL HIGH (ref 0–44)
AST: 61 U/L — ABNORMAL HIGH (ref 15–41)
Albumin: 2.7 g/dL — ABNORMAL LOW (ref 3.5–5.0)
Alkaline Phosphatase: 60 U/L (ref 38–126)
Anion gap: 6 (ref 5–15)
BUN: 32 mg/dL — ABNORMAL HIGH (ref 8–23)
CO2: 23 mmol/L (ref 22–32)
Calcium: 8.4 mg/dL — ABNORMAL LOW (ref 8.9–10.3)
Chloride: 103 mmol/L (ref 98–111)
Creatinine, Ser: 0.93 mg/dL (ref 0.61–1.24)
GFR, Estimated: >60 mL/min (ref >60)
Glucose, Bld: 348 mg/dL — ABNORMAL HIGH (ref 70–99)
Potassium: 4.7 mmol/L (ref 3.5–5.1)
Sodium: 132 mmol/L — ABNORMAL LOW (ref 135–145)
Total Bilirubin: 0.4 mg/dL (ref 0.3–1.2)
Total Protein: 5 g/dL — ABNORMAL LOW (ref 6.5–8.1)

## 2021-04-01 LAB — GLUCOSE, CAPILLARY: Glucose-Capillary: 291 mg/dL — ABNORMAL HIGH (ref 70–99)

## 2021-04-01 MED ORDER — INSULIN ASPART 100 UNIT/ML IJ SOLN
0.0000 [IU] | Freq: Three times a day (TID) | INTRAMUSCULAR | Status: DC
  Administered 2021-04-01: 8 [IU] via SUBCUTANEOUS

## 2021-04-01 MED ORDER — ENSURE ENLIVE PO LIQD
237.0000 mL | Freq: Two times a day (BID) | ORAL | Status: DC
  Administered 2021-04-01 (×2): 237 mL via ORAL

## 2021-04-01 MED ORDER — METOPROLOL TARTRATE 50 MG PO TABS: 50.0000 mg | ORAL_TABLET | Freq: Two times a day (BID) | ORAL | 0 refills | Status: DC

## 2021-04-01 MED ORDER — INSULIN ASPART 100 UNIT/ML IJ SOLN: 0.0000 [IU] | Freq: Three times a day (TID) | INTRAMUSCULAR | Status: DC

## 2021-04-01 MED ORDER — ENSURE ENLIVE PO LIQD: 237.0000 mL | Freq: Two times a day (BID) | ORAL | 12 refills | Status: DC

## 2021-04-01 MED ORDER — ATORVASTATIN CALCIUM 80 MG PO TABS: 80.0000 mg | ORAL_TABLET | Freq: Every day | ORAL | 0 refills | Status: DC

## 2021-04-01 MED ORDER — PANTOPRAZOLE SODIUM 40 MG PO TBEC: 40.0000 mg | DELAYED_RELEASE_TABLET | Freq: Every day | ORAL | 0 refills | Status: DC

## 2021-04-01 MED ORDER — DILTIAZEM HCL ER COATED BEADS 360 MG PO CP24: 360.0000 mg | ORAL_CAPSULE | Freq: Every day | ORAL | 0 refills | Status: DC

## 2021-04-01 MED ORDER — PREDNISONE 10 MG (21) PO TBPK: 10.0000 mg | ORAL_TABLET | ORAL | 0 refills | Status: DC

## 2021-04-01 MED ORDER — METFORMIN HCL 500 MG PO TABS: 500.0000 mg | ORAL_TABLET | Freq: Every day | ORAL | 0 refills | Status: DC

## 2021-04-01 MED ORDER — APIXABAN 5 MG PO TABS: 5.0000 mg | ORAL_TABLET | Freq: Two times a day (BID) | ORAL | 0 refills | Status: DC

## 2021-04-01 NOTE — Progress Notes (Signed)
Pt sats this am and afternoon remained 89-90 % when walking in the room. Sat's went up to 100% for 2 seconds and dropped back down. Pt on room air sats at 90% when awake and sitting in the bed consistently. Heart rate ranges from 107-120'S on avg. Respirations: 18-24. Pt did get winded with walking and lightheaded- pt sat and lightheadedness went away. Pt encouraged to take breaks as needed. MD made aware, will continue to monitor.

## 2021-04-01 NOTE — Care Management (Signed)
04-01-21 1533 Case Manager provided the patient with 30 day free Eliquis card.

## 2021-04-01 NOTE — Plan of Care (Signed)
  Problem: Education: Goal: Knowledge of General Education information will improve Description Including pain rating scale, medication(s)/side effects and non-pharmacologic comfort measures Outcome: Progressing   Problem: Health Behavior/Discharge Planning: Goal: Ability to manage health-related needs will improve Outcome: Progressing   Problem: Clinical Measurements: Goal: Ability to maintain clinical measurements within normal limits will improve Outcome: Progressing Goal: Will remain free from infection Outcome: Progressing Goal: Diagnostic test results will improve Outcome: Progressing Goal: Respiratory complications will improve Outcome: Progressing Goal: Cardiovascular complication will be avoided Outcome: Progressing   Problem: Activity: Goal: Risk for activity intolerance will decrease Outcome: Progressing   Problem: Safety: Goal: Ability to remain free from injury will improve Outcome: Progressing   Problem: Skin Integrity: Goal: Risk for impaired skin integrity will decrease Outcome: Progressing   

## 2021-04-01 NOTE — Discharge Summary (Addendum)
Physician Discharge Summary  Manuel ScheuermannRandy F Richardson ZOX:[id number] DOB: 1956-02-25 DOA: 03/27/2021  PCP: Charlane FerrettiSkakle, [place], DO  Admit date: 03/27/2021 Discharge date: 04/01/2021  Admitted From: Hom Disposition:  Home  Recommendations for Outpatient Follow-up:  1. Follow up with PCP in 1-2 weeks 2. Please obtain BMP/CBC in one week 3.   To follow with cardiology as an outpatient regarding new onset A. Fib 4.    Please follow-up with the A1c sent before discharge, likely hyperglycemia at day of discharge related to steroids and COVID .  Home Health:NO Equipment/Devices:none  Discharge Condition:Stable CODE STATUS:FULL Diet recommendation: Heart Healthy   Brief/Interim Summary:   New onset A.Fib RVR  New onset, management by cardiology, initially on Cardizem drip.   -Currently transitioned to oral regimen, heart rate uncontrolled, so has increased his metoprolol to 50 mg p.o. twice daily, has improved at rest, will be discharged on Cardizem CD 360 mg oral daily, and metoprolol 50 mg oral twice daily, was ambulated in the room today, heart rate in the 110s with activity, in the 90s at rest.  -TSH within normal limit, -2D echo with normal LV function -On heparin GTT T for anticoagulation, transition to Eliquis, given 30 days card by case management -Per cardiology if he does not convert, then plan cardioversion once he recovers from COVID  COVID-19 infection -He received monoclonal therapy in ED -There is no oxygen requirement on discharge, he was treated with IV steroids, and IV remdesivir as well, he will be discharged on prednisone taper    COPD exacerbation -Unclear if his exertional dyspnea,and  hypoxia related to COVID or COPD exacerbation , there is already evidence of COPD on his chest x-ray, but he is treated with IV steroids, he will be discharged on prednisone taper   Hyperglycemia  -This is elevated the day of discharge 348, but overall it has been controlled, so we will send A1c  before discharge, and he will be discharged on low dose  metformin, hyperglycemia likely related to IV steroids and COVID.  tobacco abuse -He was counseled  HTN  -He is currently on Cardizem and metoprolol, lisinopril has been stopped   I have discussed discharge plan with the patient, and his sister-in-law Programmer, applications"Cricket" who  retired EMT  Discharge Diagnoses:  Principal Problem:   Atrial fibrillation with RVR (HCC) Active Problems:   Essential hypertension, benign   COPD with emphysema (HCC)   COVID-19 virus infection    Discharge Instructions  Discharge Instructions    Diet - low sodium heart healthy   Complete by: As directed    Discharge instructions   Complete by: As directed    Follow with Primary MD Charlane FerrettiSkakle, [place], DO in 7 days   Get CBC, CMP,  checked  by Primary MD next visit.    Activity: As tolerated with Full fall precautions use walker/cane & assistance as needed   Disposition Home    Diet: Heart Healthy  , with feeding assistance and aspiration precautions.  For Heart failure patients - Check your Weight same time everyday, if you gain over 2 pounds, or you develop in leg swelling, experience more shortness of breath or chest pain, call your Primary MD immediately. Follow Cardiac Low Salt Diet and 1.5 lit/day fluid restriction.   On your next visit with your primary care physician please Get Medicines reviewed and adjusted.   Please request your Prim.MD to go over all Hospital Tests and Procedure/Radiological results at the follow up, please get all Hospital records sent to  your Prim MD by signing hospital release before you go home.   If you experience worsening of your admission symptoms, develop shortness of breath, life threatening emergency, suicidal or homicidal thoughts you must seek medical attention immediately by calling 911 or calling your MD immediately  if symptoms less severe.  You Must read complete instructions/literature along with all  the possible adverse reactions/side effects for all the Medicines you take and that have been prescribed to you. Take any new Medicines after you have completely understood and accpet all the possible adverse reactions/side effects.   Do not drive, operating heavy machinery, perform activities at heights, swimming or participation in water activities or provide baby sitting services if your were admitted for syncope or siezures until you have seen by Primary MD or a Neurologist and advised to do so again.  Do not drive when taking Pain medications.    Do not take more than prescribed Pain, Sleep and Anxiety Medications  Special Instructions: If you have smoked or chewed Tobacco  in the last 2 yrs please stop smoking, stop any regular Alcohol  and or any Recreational drug use.  Wear Seat belts while driving.   Please note  You were cared for by a hospitalist during your hospital stay. If you have any questions about your discharge medications or the care you received while you were in the hospital after you are discharged, you can call the unit and asked to speak with the hospitalist on call if the hospitalist that took care of you is not available. Once you are discharged, your primary care physician will handle any further medical issues. Please note that NO REFILLS for any discharge medications will be authorized once you are discharged, as it is imperative that you return to your primary care physician (or establish a relationship with a primary care physician if you do not have one) for your aftercare needs so that they can reassess your need for medications and monitor your lab values.   Increase activity slowly   Complete by: As directed      Allergies as of 04/01/2021   No Known Allergies     Medication List    STOP taking these medications   Aspirin Low Dose 81 MG EC tablet Generic drug: aspirin   lisinopril 10 MG tablet Commonly known as: ZESTRIL   sildenafil 50 MG  tablet Commonly known as: Viagra     TAKE these medications   albuterol 108 (90 Base) MCG/ACT inhaler Commonly known as: ProAir HFA Inhale 2 puffs into the lungs 3 (three) times daily. What changed:   when to take this  reasons to take this   apixaban 5 MG Tabs tablet Commonly known as: ELIQUIS Take 1 tablet (5 mg total) by mouth 2 (two) times daily.   atorvastatin 80 MG tablet Commonly known as: LIPITOR Take 1 tablet (80 mg total) by mouth daily. Start taking on: April 02, 2021 What changed:   medication strength  how much to take   [employer] 160-9-4.8 MCG/ACT Aero Generic drug: Budeson-Glycopyrrol-Formoterol Inhale 2 puffs into the lungs 2 (two) times daily.   diltiazem 360 MG 24 hr capsule Commonly known as: CARDIZEM CD Take 1 capsule (360 mg total) by mouth daily. Start taking on: April 02, 2021   feeding supplement Liqd Take 237 mLs by mouth 2 (two) times daily between meals.   HYDROcodone bit-homatropine 5-1.5 MG/5ML syrup Commonly known as: HYCODAN Take 5 mLs by mouth every 6 (six) hours as needed  for cough.   metFORMIN 500 MG tablet Commonly known as: Glucophage Take 1 tablet (500 mg total) by mouth daily with breakfast.   metoprolol tartrate 50 MG tablet Commonly known as: LOPRESSOR Take 1 tablet (50 mg total) by mouth 2 (two) times daily.   nicotine 21 mg/24hr patch Commonly known as: NICODERM CQ - dosed in mg/24 hours Place 1 patch (21 mg total) onto the skin daily.   pantoprazole 40 MG tablet Commonly known as: PROTONIX Take 1 tablet (40 mg total) by mouth daily. Start taking on: April 02, 2021   predniSONE 10 MG (21) Tbpk tablet Commonly known as: STERAPRED UNI-PAK 21 TAB Take 1 tablet (10 mg total) by mouth See admin instructions. 6,5,4,3,2,1   umeclidinium-vilanterol 62.5-25 MCG/INH Aepb Commonly known as: Anoro Ellipta Inhale 1 puff into the lungs daily.       Follow-up Information    Charlane Ferretti, DO Follow up in 1 week(s).    Specialty: Internal Medicine Contact information: [address] [place] [address] [phone]              No Known Allergies  Consultations:  cardiology   Procedures/Studies: [facility] Chest Port 1 View  Result Date: 03/27/2021 CLINICAL DATA:  History of COVID-19 positivity with shortness of breath EXAM: PORTABLE CHEST 1 VIEW COMPARISON:  10/03/2010 FINDINGS: Cardiac shadow is within normal limits. Aortic calcifications are noted. The lungs are hyperinflated with mild bibasilar atelectatic changes. No focal confluent infiltrate is seen. No sizable effusion is noted. No bony abnormality is seen. IMPRESSION: Mild bibasilar atelectasis. Changes of COPD. Electronically Signed   By: Alcide Clever M.D.   On: 03/27/2021 18:27   ECHOCARDIOGRAM COMPLETE  Result Date: 03/28/2021    ECHOCARDIOGRAM REPORT   Patient Name:   Manuel Richardson Date of Exam: 03/28/2021 Medical Rec #:  [id number]      Height:       69.0 in Accession #:    [id number]     Weight:       133.2 lb Date of Birth:  12-May-1956     BSA:          1.738 m Patient Age:    64 years       BP:           96/72 mmHg Patient Gender: M              HR:           111 bpm. Exam Location:  Inpatient Procedure: 2D Echo, Cardiac Doppler and Color Doppler Indications:    I48.0 Paroxysmal atrial fibrillation  History:        Patient has no prior history of Echocardiogram examinations.                 COPD; Risk Factors:Dyslipidemia, GERD and Current Smoker.                 COVID-19 Positive.  Sonographer:    Elmarie Shiley Dance Referring Phys: 61 Alvin Diffee S Monnie Anspach IMPRESSIONS  1. Left ventricular ejection fraction, by estimation, is 55 to 60%. The left ventricle has normal function. The left ventricle demonstrates regional wall motion abnormalities (see scoring diagram/findings for description). Left ventricular diastolic parameters are indeterminate.  2. Right ventricular systolic function is normal. The right ventricular size is normal. There is normal  pulmonary artery systolic pressure. The estimated right ventricular systolic pressure is 30.3 mmHg.  3. The mitral valve is grossly normal. Moderate mitral valve regurgitation - suspect mechanism is  posterior leaflet restriction. No evidence of mitral stenosis.  4. The aortic valve is normal in structure. Aortic valve regurgitation is not visualized. No aortic stenosis is present.  5. The inferior vena cava is dilated in size with >50% respiratory variability, suggesting right atrial pressure of 8 mmHg. FINDINGS  Left Ventricle: Left ventricular ejection fraction, by estimation, is 55 to 60%. The left ventricle has normal function. The left ventricle demonstrates regional wall motion abnormalities. The left ventricular internal cavity size was normal in size. There is no left ventricular hypertrophy. Left ventricular diastolic parameters are indeterminate.  LV Wall Scoring: The posterior wall, basal anterolateral segment, and basal inferior segment are hypokinetic. Right Ventricle: The right ventricular size is normal. No increase in right ventricular wall thickness. Right ventricular systolic function is normal. There is normal pulmonary artery systolic pressure. The tricuspid regurgitant velocity is 2.36 m/s, and  with an assumed right atrial pressure of 8 mmHg, the estimated right ventricular systolic pressure is 30.3 mmHg. Left Atrium: Left atrial size was normal in size. Right Atrium: Right atrial size was normal in size. Pericardium: There is no evidence of pericardial effusion. Mitral Valve: The mitral valve is grossly normal. Mild mitral annular calcification. Moderate mitral valve regurgitation. No evidence of mitral valve stenosis. Tricuspid Valve: The tricuspid valve is normal in structure. Tricuspid valve regurgitation is mild . No evidence of tricuspid stenosis. Aortic Valve: The aortic valve is normal in structure. Aortic valve regurgitation is not visualized. No aortic stenosis is present. Pulmonic  Valve: The pulmonic valve was normal in structure. Pulmonic valve regurgitation is trivial. No evidence of pulmonic stenosis. Aorta: The aortic root is normal in size and structure. Venous: The inferior vena cava is dilated in size with greater than 50% respiratory variability, suggesting right atrial pressure of 8 mmHg. IAS/Shunts: No atrial level shunt detected by color flow Doppler.  LEFT VENTRICLE PLAX 2D LVIDd:         3.80 cm LVIDs:         3.10 cm LV PW:         1.10 cm LV IVS:        0.90 cm LVOT diam:     2.00 cm LV SV:         44 LV SV Index:   25 LVOT Area:     3.14 cm  RIGHT VENTRICLE          IVC RV Basal diam:  2.50 cm  IVC diam: 2.60 cm TAPSE (M-mode): 1.6 cm LEFT ATRIUM             Index       RIGHT ATRIUM           Index LA diam:        3.70 cm 2.13 cm/m  RA Area:     12.70 cm LA Vol (A2C):   43.9 ml 25.26 ml/m RA Volume:   30.10 ml  17.32 ml/m LA Vol (A4C):   39.5 ml 22.72 ml/m LA Biplane Vol: 42.7 ml 24.57 ml/m  AORTIC VALVE LVOT Vmax:   75.45 cm/s LVOT Vmean:  50.450 cm/s LVOT VTI:    0.141 m  AORTA Ao Root diam: 3.60 cm MITRAL VALVE                TRICUSPID VALVE MV Area (PHT): 3.81 cm     TR Peak grad:   22.3 mmHg MV Decel Time: 199 msec     TR Vmax:  236.00 cm/s MV E velocity: 125.00 cm/s                             SHUNTS                             Systemic VTI:  0.14 m                             Systemic Diam: 2.00 cm Weston Brass MD Electronically signed by Weston Brass MD Signature Date/Time: 03/28/2021/3:27:33 PM    Final      Subjective:  And reports he is feeling a lot better today, asking to go home, he ambulated in the room maintaining 92% on room air with activity, heart rate was in the 110's with activity, but it is in the 90s at rest. Discharge Exam: Vitals:   04/01/21 0851 04/01/21 1529  BP: 117/75 122/82  Pulse: (!) 101 (!) 117  Resp: 18 17  Temp: 97.9 F (36.6 C) 98.3 F (36.8 C)  SpO2: 93% 92%   Vitals:   03/31/21 2332 04/01/21 0726  04/01/21 0851 04/01/21 1529  BP: 118/85 114/68 117/75 122/82  Pulse: (!) 107 68 (!) 101 (!) 117  Resp: Temp: 97.8 F (36.6 C) 98.1 F (36.7 C) 97.9 F (36.6 C) 98.3 F (36.8 C)  TempSrc: Oral Oral Oral Oral  SpO2: 94% 93% 93% 92%  Weight:        General: Pt is alert, awake, not in acute distress Cardiovascular: irr irr , S1/S2 +, no rubs, no gallops Respiratory: CTA bilaterally, no wheezing, no rhonchi Abdominal: Soft, NT, ND, bowel sounds + Extremities: no edema, no cyanosis    The results of significant diagnostics from this hospitalization (including imaging, microbiology, ancillary and laboratory) are listed below for reference.     Microbiology: Recent Results (from the past 240 hour(s))  Resp Panel by RT-PCR (Flu A&B, Covid) Nasopharyngeal Swab     Status: Abnormal   Collection Time: 03/28/21  4:03 AM   Specimen: Nasopharyngeal Swab; Nasopharyngeal(NP) swabs in vial transport medium  Result Value Ref Range Status   SARS Coronavirus 2 by RT PCR POSITIVE (A) NEGATIVE Final    Comment: RESULT CALLED TO, READ BACK BY AND VERIFIED WITH: RN GRACE TATE BY MESSAN H. AT 1610 ON 03/28/2021 (NOTE) SARS-CoV-2 target nucleic acids are DETECTED.  The SARS-CoV-2 RNA is generally detectable in upper respiratory specimens during the acute phase of infection. Positive results are indicative of the presence of the identified virus, but do not rule out bacterial infection or co-infection with other pathogens not detected by the test. Clinical correlation with patient history and other diagnostic information is necessary to determine patient infection status. The expected result is Negative.  Fact Sheet for Patients: BloggerCourse.com  Fact Sheet for Healthcare Providers: SeriousBroker.it  This test is not yet approved or cleared by the [place] FDA and  has been authorized for detection and/or diagnosis of  SARS-CoV-2 by FDA under an Emergency Use Authorization (EUA).  This EUA will remain in effect (meaning thi s test can be used) for the duration of  the COVID-19 declaration under Section 564(b)(1) of the Act, 21 U.S.C. section 360bbb-3(b)(1), unless the authorization is terminated or revoked sooner.     Influenza A by PCR NEGATIVE NEGATIVE Final   Influenza B  by PCR NEGATIVE NEGATIVE Final    Comment: (NOTE) The Xpert Xpress SARS-CoV-2/FLU/RSV plus assay is intended as an aid in the diagnosis of influenza from Nasopharyngeal swab specimens and should not be used as a sole basis for treatment. Nasal washings and aspirates are unacceptable for Xpert Xpress SARS-CoV-2/FLU/RSV testing.  Fact Sheet for Patients: BloggerCourse.com  Fact Sheet for Healthcare Providers: SeriousBroker.it  This test is not yet approved or cleared by the [place] FDA and has been authorized for detection and/or diagnosis of SARS-CoV-2 by FDA under an Emergency Use Authorization (EUA). This EUA will remain in effect (meaning this test can be used) for the duration of the COVID-19 declaration under Section 564(b)(1) of the Act, 21 U.S.C. section 360bbb-3(b)(1), unless the authorization is terminated or revoked.  Performed at [facility], [address] [place], [place] [address]      Labs: BNP (last 3 results) No results for input(s): BNP in the last 8760 hours. Basic Metabolic Panel: Recent Labs  Lab 03/27/21 1558 03/29/21 0415 03/30/21 0208 03/31/21 0128 04/01/21 0145  NA 133* 138 137 135 132*  K 4.9 4.7 5.1 4.9 4.7  CL 96* 108 105 102 103  CO2 29 25 27 26 23   GLUCOSE 100* 132* 129* 170* 348*  BUN 13 15 22  32* 32*  CREATININE 0.78 0.79 0.83 0.87 0.93  CALCIUM 9.4 8.9 9.2 8.7* 8.4*   Liver Function Tests: Recent Labs  Lab 03/29/21 0415 03/30/21 0208 03/31/21 0128 04/01/21 0145  AST 20 36 43* 61*  ALT 30 44 61* 81*   ALKPHOS 59 66 60 60  BILITOT 0.6 0.5 0.6 0.4  PROT 5.5* 6.1* 5.4* 5.0*  ALBUMIN 2.9* 3.1* 2.8* 2.7*   No results for input(s): LIPASE, AMYLASE in the last 168 hours. No results for input(s): AMMONIA in the last 168 hours. CBC: Recent Labs  Lab 03/27/21 1558 03/29/21 0415 03/30/21 0208 03/31/21 0128 04/01/21 0145  WBC 9.3 6.3 8.5 10.8* 11.6*  NEUTROABS 7.3 3.7 7.3 9.5* 10.5*  HGB 16.6 14.0 14.5 13.5 13.3  HCT 50.3 41.9 44.2 39.8 39.4  MCV 95.3 92.9 93.6 92.8 94.0  PLT 180 154 214 202 226   Cardiac Enzymes: No results for input(s): CKTOTAL, CKMB, CKMBINDEX, TROPONINI in the last 168 hours. BNP: Invalid input(s): POCBNP CBG: Recent Labs  Lab 04/01/21 1527  GLUCAP 291*   D-Dimer No results for input(s): DDIMER in the last 72 hours. Hgb A1c No results for input(s): HGBA1C in the last 72 hours. Lipid Profile No results for input(s): CHOL, HDL, LDLCALC, TRIG, CHOLHDL, LDLDIRECT in the last 72 hours. Thyroid function studies No results for input(s): TSH, T4TOTAL, T3FREE, THYROIDAB in the last 72 hours.  Invalid input(s): FREET3 Anemia work up No results for input(s): VITAMINB12, FOLATE, FERRITIN, TIBC, IRON, RETICCTPCT in the last 72 hours. Urinalysis    Component Value Date/Time   COLORURINE YELLOW 10/30/2010 1037   APPEARANCEUR CLEAR 10/30/2010 1037   LABSPEC 1.009 10/30/2010 1037   PHURINE 5.0 10/30/2010 1037   GLUCOSEU NEGATIVE 10/30/2010 1037   HGBUR NEGATIVE 10/30/2010 1037   BILIRUBINUR neg 03/30/2012 1010   KETONESUR NEGATIVE 10/30/2010 1037   PROTEINUR neg 03/30/2012 1010   PROTEINUR NEGATIVE 10/30/2010 1037   UROBILINOGEN negative 03/30/2012 1010   UROBILINOGEN 0.2 10/30/2010 1037   NITRITE neg 03/30/2012 1010   NITRITE NEGATIVE 10/30/2010 1037   LEUKOCYTESUR Negative 03/30/2012 1010   Sepsis Labs Invalid input(s): PROCALCITONIN,  WBC,  LACTICIDVEN Microbiology Recent Results (from the past 240  hour(s))  Resp Panel by RT-PCR (Flu A&B, Covid)  Nasopharyngeal Swab     Status: Abnormal   Collection Time: 03/28/21  4:03 AM   Specimen: Nasopharyngeal Swab; Nasopharyngeal(NP) swabs in vial transport medium  Result Value Ref Range Status   SARS Coronavirus 2 by RT PCR POSITIVE (A) NEGATIVE Final    Comment: RESULT CALLED TO, READ BACK BY AND VERIFIED WITH: RN GRACE TATE BY MESSAN H. AT 1610 ON 03/28/2021 (NOTE) SARS-CoV-2 target nucleic acids are DETECTED.  The SARS-CoV-2 RNA is generally detectable in upper respiratory specimens during the acute phase of infection. Positive results are indicative of the presence of the identified virus, but do not rule out bacterial infection or co-infection with other pathogens not detected by the test. Clinical correlation with patient history and other diagnostic information is necessary to determine patient infection status. The expected result is Negative.  Fact Sheet for Patients: BloggerCourse.com  Fact Sheet for Healthcare Providers: SeriousBroker.it  This test is not yet approved or cleared by the [place] FDA and  has been authorized for detection and/or diagnosis of SARS-CoV-2 by FDA under an Emergency Use Authorization (EUA).  This EUA will remain in effect (meaning thi s test can be used) for the duration of  the COVID-19 declaration under Section 564(b)(1) of the Act, 21 U.S.C. section 360bbb-3(b)(1), unless the authorization is terminated or revoked sooner.     Influenza A by PCR NEGATIVE NEGATIVE Final   Influenza B by PCR NEGATIVE NEGATIVE Final    Comment: (NOTE) The Xpert Xpress SARS-CoV-2/FLU/RSV plus assay is intended as an aid in the diagnosis of influenza from Nasopharyngeal swab specimens and should not be used as a sole basis for treatment. Nasal washings and aspirates are unacceptable for Xpert Xpress SARS-CoV-2/FLU/RSV testing.  Fact Sheet for Patients: BloggerCourse.com  Fact  Sheet for Healthcare Providers: SeriousBroker.it  This test is not yet approved or cleared by the [place] FDA and has been authorized for detection and/or diagnosis of SARS-CoV-2 by FDA under an Emergency Use Authorization (EUA). This EUA will remain in effect (meaning this test can be used) for the duration of the COVID-19 declaration under Section 564(b)(1) of the Act, 21 U.S.C. section 360bbb-3(b)(1), unless the authorization is terminated or revoked.  Performed at [facility], [address] [place], [place] [address]      Time coordinating discharge: Over 30 minutes  SIGNED:   Huey Bienenstock, MD  [facility] 04/01/2021, 3:54 PM Pager   If 7PM-7AM, please contact night-coverage [email or web address] Password [email or web address]

## 2021-04-02 LAB — HEMOGLOBIN A1C
Hgb A1c MFr Bld: 6.7 % — ABNORMAL HIGH (ref 4.8–5.6)
Mean Plasma Glucose: 146 mg/dL

## 2021-09-05 DIAGNOSIS — D696 Thrombocytopenia, unspecified: Secondary | ICD-10-CM | POA: Diagnosis not present

## 2021-09-05 DIAGNOSIS — E119 Type 2 diabetes mellitus without complications: Secondary | ICD-10-CM | POA: Diagnosis not present

## 2021-09-05 DIAGNOSIS — J449 Chronic obstructive pulmonary disease, unspecified: Secondary | ICD-10-CM | POA: Diagnosis not present

## 2021-09-05 DIAGNOSIS — F4322 Adjustment disorder with anxiety: Secondary | ICD-10-CM | POA: Diagnosis not present

## 2021-09-05 DIAGNOSIS — F321 Major depressive disorder, single episode, moderate: Secondary | ICD-10-CM | POA: Diagnosis not present

## 2021-09-05 DIAGNOSIS — F419 Anxiety disorder, unspecified: Secondary | ICD-10-CM | POA: Diagnosis not present

## 2021-09-05 DIAGNOSIS — I10 Essential (primary) hypertension: Secondary | ICD-10-CM | POA: Diagnosis not present

## 2021-09-05 DIAGNOSIS — E46 Unspecified protein-calorie malnutrition: Secondary | ICD-10-CM | POA: Diagnosis not present

## 2022-04-02 DIAGNOSIS — J449 Chronic obstructive pulmonary disease, unspecified: Secondary | ICD-10-CM | POA: Diagnosis not present

## 2022-04-02 DIAGNOSIS — R058 Other specified cough: Secondary | ICD-10-CM | POA: Diagnosis not present

## 2022-04-02 DIAGNOSIS — J441 Chronic obstructive pulmonary disease with (acute) exacerbation: Secondary | ICD-10-CM | POA: Diagnosis not present

## 2022-05-14 DIAGNOSIS — I700 Atherosclerosis of aorta: Secondary | ICD-10-CM | POA: Diagnosis not present

## 2022-05-14 DIAGNOSIS — F419 Anxiety disorder, unspecified: Secondary | ICD-10-CM | POA: Diagnosis not present

## 2022-05-14 DIAGNOSIS — I480 Paroxysmal atrial fibrillation: Secondary | ICD-10-CM | POA: Diagnosis not present

## 2022-05-14 DIAGNOSIS — I10 Essential (primary) hypertension: Secondary | ICD-10-CM | POA: Diagnosis not present

## 2022-05-14 DIAGNOSIS — Z125 Encounter for screening for malignant neoplasm of prostate: Secondary | ICD-10-CM | POA: Diagnosis not present

## 2022-05-14 DIAGNOSIS — E119 Type 2 diabetes mellitus without complications: Secondary | ICD-10-CM | POA: Diagnosis not present

## 2022-05-14 DIAGNOSIS — E46 Unspecified protein-calorie malnutrition: Secondary | ICD-10-CM | POA: Diagnosis not present

## 2022-05-14 DIAGNOSIS — G252 Other specified forms of tremor: Secondary | ICD-10-CM | POA: Diagnosis not present

## 2022-05-14 DIAGNOSIS — R0602 Shortness of breath: Secondary | ICD-10-CM | POA: Diagnosis not present

## 2022-05-14 DIAGNOSIS — D692 Other nonthrombocytopenic purpura: Secondary | ICD-10-CM | POA: Diagnosis not present

## 2022-05-14 DIAGNOSIS — R109 Unspecified abdominal pain: Secondary | ICD-10-CM | POA: Diagnosis not present

## 2022-05-14 DIAGNOSIS — R911 Solitary pulmonary nodule: Secondary | ICD-10-CM | POA: Diagnosis not present

## 2022-05-14 DIAGNOSIS — I739 Peripheral vascular disease, unspecified: Secondary | ICD-10-CM | POA: Diagnosis not present

## 2022-05-14 DIAGNOSIS — F321 Major depressive disorder, single episode, moderate: Secondary | ICD-10-CM | POA: Diagnosis not present

## 2022-05-14 DIAGNOSIS — E559 Vitamin D deficiency, unspecified: Secondary | ICD-10-CM | POA: Diagnosis not present

## 2022-05-14 DIAGNOSIS — E1159 Type 2 diabetes mellitus with other circulatory complications: Secondary | ICD-10-CM | POA: Diagnosis not present

## 2022-05-14 DIAGNOSIS — D696 Thrombocytopenia, unspecified: Secondary | ICD-10-CM | POA: Diagnosis not present

## 2022-05-14 DIAGNOSIS — R748 Abnormal levels of other serum enzymes: Secondary | ICD-10-CM | POA: Diagnosis not present

## 2022-05-14 DIAGNOSIS — J9611 Chronic respiratory failure with hypoxia: Secondary | ICD-10-CM | POA: Diagnosis not present

## 2022-05-14 DIAGNOSIS — D6869 Other thrombophilia: Secondary | ICD-10-CM | POA: Diagnosis not present

## 2022-05-14 DIAGNOSIS — R0989 Other specified symptoms and signs involving the circulatory and respiratory systems: Secondary | ICD-10-CM | POA: Diagnosis not present

## 2022-05-30 ENCOUNTER — Encounter: Payer: Self-pay | Admitting: Pulmonary Disease

## 2022-05-30 ENCOUNTER — Ambulatory Visit: Payer: Medicare HMO | Admitting: Pulmonary Disease

## 2022-05-30 VITALS — BP 126/76 | HR 77 | Temp 98.0°F | Ht 69.0 in | Wt 126.0 lb

## 2022-05-30 DIAGNOSIS — J439 Emphysema, unspecified: Secondary | ICD-10-CM

## 2022-05-30 DIAGNOSIS — R918 Other nonspecific abnormal finding of lung field: Secondary | ICD-10-CM | POA: Diagnosis not present

## 2022-05-30 DIAGNOSIS — F17210 Nicotine dependence, cigarettes, uncomplicated: Secondary | ICD-10-CM | POA: Diagnosis not present

## 2022-05-30 DIAGNOSIS — R5382 Chronic fatigue, unspecified: Secondary | ICD-10-CM | POA: Diagnosis not present

## 2022-05-30 NOTE — Progress Notes (Signed)
Synopsis: Referred in August 2023 for Dyspnea by Dr. Thornell Mule.  He has a history of COPD diagnosed by spirometry in 2019.  He has smoked as much as 3 packs a day for most of his life, still smoking 2ppd when seen in consultation on 05/2022.  Subjective:   PATIENT ID: Manuel Richardson GENDER: male DOB: 1956-08-03, MRN: [id number]   HPI  Chief Complaint  Patient presents with   Pulmonary Consult    Referred by Dr Thornell Mule. Pt c/o DOE and fatigue since May 2022. He gets winded walking to the mailbox and back. He has occ cough- prod with clear sputum.     He says "I get tired easily".  Specifically he noticed several months ago that he started feeling more fatigued with exertion in May 2022 after he had COVID.  He saw urgent care, had a breathing treatment and ended up with atrial fibrillation with RVR.  He is still taking Eliquis for atrial fib.  He takes Clinical cytogeneticist twice a day.  Prior to COVID last year he never really took any inhaled.  He thinks that [place] helped a little.  He used to get bronchitis every year, but hasn't had that in ten years.  He coughs up mucus every day, typically clear.  He says that he has a little dyspnea, but the biggest problem is fatigue.  He walked his dog around the block last week and did OK.  Can carry in groceries.  Can climb a flight of stairs but no dyspnea doing that.    Past Medical History:  Diagnosis Date   COPD (chronic obstructive pulmonary disease) (HCC)    Erectile dysfunction    Full dentures    GERD (gastroesophageal reflux disease)    Hyperlipidemia    Tobacco use disorder      Family History  Problem Relation Age of Onset   Heart disease Mother        died 53 with MI   Cancer Father        lung cancer   Asthma Father    Lung cancer Father        smoked   Hypertension Brother    Diabetes Neg Hx    Stroke Neg Hx      Social History   Socioeconomic History   Marital status: Divorced    Spouse name: Not on file   Number of children:  Not on file   Years of education: Not on file   Highest education level: Not on file  Occupational History   Occupation: Nurse, mental health: [employer]    Comment: HVAC, 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  Other Topics Concern   Not on file  Social History Narrative   Married, 1 daughter in [place], exercise - walking on job   Social Determinants of Health   Financial Resource Strain: Not on file  Food Insecurity: Not on file  Transportation Needs: Not on file  Physical Activity: Not on file  Stress: Not on file  Social Connections: Not on file  Intimate Partner Violence: Not on file     No Known Allergies   Outpatient Medications Prior to Visit  Medication Sig Dispense Refill   albuterol (PROAIR HFA) 108 (90 Base) MCG/ACT inhaler Inhale 2 puffs into the lungs 3 (three) times daily. (Patient taking differently: Inhale 2 puffs into the lungs every 6 (six) hours as needed for wheezing or shortness of breath.) 1 Inhaler 1   apixaban (ELIQUIS) 5 MG TABS tablet Take 1 tablet (5 mg total) by mouth 2 (two) times daily. 60 tablet 0   atorvastatin (LIPITOR) 80 MG tablet Take 1 tablet (80 mg total) by mouth daily. 30 tablet 0   BREZTRI AEROSPHERE 160-9-4.8 MCG/ACT AERO Inhale 2 puffs into the lungs 2 (two) times daily.     diltiazem (CARDIZEM CD) 360 MG 24 hr capsule Take 1 capsule (360 mg total) by mouth daily. 30 capsule 0   feeding supplement (ENSURE ENLIVE / ENSURE PLUS) LIQD Take 237 mLs by mouth 2 (two) times daily between meals.  237 mL 12   HYDROcodone bit-homatropine (HYCODAN) 5-1.5 MG/5ML syrup Take 5 mLs by mouth every 6 (six) hours as needed for cough.     metFORMIN (GLUCOPHAGE) 500 MG tablet Take 1 tablet (500 mg total) by mouth daily with breakfast. 15 tablet 0   metoprolol tartrate (LOPRESSOR) 50 MG tablet Take 1 tablet (50 mg total) by mouth 2 (two) times daily. 60 tablet 0   nicotine (NICODERM CQ - DOSED IN MG/24 HOURS) 21 mg/24hr patch Place 1 patch (21 mg total) onto the skin daily. (Patient not taking: No sig reported) 28 patch 0   pantoprazole (PROTONIX) 40 MG tablet Take 1 tablet (40 mg total) by mouth daily. 30 tablet 0   predniSONE (STERAPRED UNI-PAK 21 TAB) 10 MG (21) TBPK tablet Take 1 tablet (10 mg total) by mouth See admin instructions. 6,5,4,3,2,1 21 tablet 0   umeclidinium-vilanterol (ANORO ELLIPTA) 62.5-25 MCG/INH AEPB Inhale 1 puff into the lungs daily. (Patient not taking: No sig reported) 1 each 2   No facility-administered medications prior to visit.    Review of Systems  Constitutional:  Positive for malaise/fatigue. Negative for chills, fever and weight loss.  HENT:  Negative for congestion, nosebleeds, sinus pain and sore throat.   Eyes:  Negative for photophobia, pain and discharge.  Respiratory:  Positive for cough, sputum production and shortness of breath. Negative for hemoptysis and wheezing.   Cardiovascular:  Negative for chest pain, palpitations, orthopnea and leg swelling.  Gastrointestinal:  Negative for abdominal pain, constipation, diarrhea, nausea and vomiting.  Genitourinary:  Negative for dysuria, frequency, hematuria and urgency.  Musculoskeletal:  Negative for back pain, joint pain, myalgias and neck pain.  Skin:  Negative for itching and rash.  Neurological:  Negative for tingling, tremors, sensory change, speech change, focal weakness, seizures, weakness and headaches.  Psychiatric/Behavioral:  Negative for memory loss, substance abuse and suicidal ideas. The patient is  not nervous/anxious.       Objective:  Physical Exam   Vitals:   05/30/22 1405  BP: 126/76  Pulse: 77  Temp: 98 F (36.7 C)  TempSrc: Oral  SpO2: 98%  Weight: 126 lb (57.2 kg)  Height: 5\' 9"  (1.753 m)   RA  05/30/2022 Walked 750 feet on room air and O2 saturation did not drop below 94%  Gen:  well appearing, no acute distress HENT: NCAT, OP clear, neck supple without masses Eyes: PERRL, EOMi Lymph: no cervical lymphadenopathy PULM: Poor airmovement B CV: RRR, no mgr, no JVD GI: BS+, soft, nontender, no hsm Derm: no rash or skin breakdown MSK: normal bulk and tone Neuro: A&Ox4, CN II-XII intact, strength 5/5 in all 4 extremities Psyche: normal mood and affect   CBC    Component Value Date/Time   WBC 11.6 (H) 04/01/2021 0145   RBC 4.19 (L) 04/01/2021 0145   HGB 13.3 04/01/2021 0145   HCT 39.4 04/01/2021 0145   PLT 226 04/01/2021 0145   MCV 94.0 04/01/2021 0145   MCH 31.7 04/01/2021 0145   MCHC 33.8 04/01/2021 0145   RDW 13.5 04/01/2021 0145   LYMPHSABS 0.6 (L) 04/01/2021 0145   MONOABS 0.4 04/01/2021 0145   EOSABS 0.0 04/01/2021 0145   BASOSABS 0.0 04/01/2021 0145     Chest imaging: 2022 Lung cancer screening CT scan> lung RADS 2 10mm nodule RUL, emphysema 2022 chest x-ray showed bibasilar atelectasis  PFT: 2019 spirometry: Ratio 59%, FEV1 1.7 L 50 %  Labs: 03/2021 CBC Hgb 12.2 gm/dL  Path:  Echo:  Heart Catheterization:       Assessment & Plan:   COPD, GOLD Grade B - Plan: Spirometry with graph  Cigarette smoker  Chronic fatigue  Abnormal CT lung screening  Discussion: Pleasant 66 year old male who has severe COPD presents today for evaluation of severe fatigue.  His fatigue could be due to any number of causes, COPD is possible, it is also possible that he may have untreated obstructive sleep apnea as this is quite common in patients with COPD.  In addition to this, his sister tells me that they just received news that he may be iron  deficient which could also be contributing to his anemia.  I explained to him today that ongoing cigarette and tobacco use will undoubtedly hasten the progression of his COPD and the best thing he could do for his overall health is to stop smoking.  Right now he says he is not interested in trying to quit.  We did discuss different treatment options including Chantix and nicotine replacement.  Plan: COPD, Gold Grade B Keep taking Breztri 2 puffs twice a day Start using a spacer for your Breztri Use albuterol 2 puffs every 4-6 hours as needed for shortness of breath Get a flu shot every year Make sure you are up-to-date on your pneumococcal vaccines with your primary care doctor Practice good hand hygiene, minimize contacts with sick people Stay active, exercise regularly  Ongoing cigarette smoking: As discussed today, I am happy to help you quit smoking when you are ready  Pulmonary nodule seen on lung cancer screening CT in 2022: It is important for you to get a follow-up CT scan as recommended by radiology in 2022.  Please have your primary care doctor's office reach out to their lung cancer screening provider.  Fatigue: This could be due to any number of causes I will try to get record of the most recent blood work from your primary care doctor's office We will arrange for an overnight sleep study to evaluate for sleep apnea  We will plan on seeing you back in 3 months or sooner if needed    Current Outpatient Medications:    albuterol (PROAIR HFA) 108 (90 Base) MCG/ACT inhaler, Inhale 2 puffs into the lungs 3 (three) times daily. (Patient taking differently: Inhale 2 puffs into the lungs every 6 (six)  hours as needed for wheezing or shortness of breath.), Disp: 1 Inhaler, Rfl: 1   apixaban (ELIQUIS) 5 MG TABS tablet, Take 1 tablet (5 mg total) by mouth 2 (two) times daily., Disp: 60 tablet, Rfl: 0   atorvastatin (LIPITOR) 80 MG tablet, Take 1 tablet (80 mg total) by mouth daily.,  Disp: 30 tablet, Rfl: 0   BREZTRI AEROSPHERE 160-9-4.8 MCG/ACT AERO, Inhale 2 puffs into the lungs 2 (two) times daily., Disp: , Rfl:

## 2022-05-30 NOTE — Patient Instructions (Signed)
COPD, Gold Grade B Keep taking Breztri 2 puffs twice a day Start using a spacer for your Breztri Use albuterol 2 puffs every 4-6 hours as needed for shortness of breath Get a flu shot every year Make sure you are up-to-date on your pneumococcal vaccines with your primary care doctor Practice good hand hygiene, minimize contacts with sick people Stay active, exercise regularly  Ongoing cigarette smoking: As discussed today, I am happy to help you quit smoking when you are ready We will set you up with lung cancer screening  Fatigue: This could be due to any number of causes I will try to get record of the most recent blood work from your primary care doctor's office We will arrange for an overnight sleep study to evaluate for sleep apnea  We will plan on seeing you back in 3 months or sooner if needed

## 2022-06-11 ENCOUNTER — Other Ambulatory Visit: Payer: Self-pay | Admitting: Internal Medicine

## 2022-06-11 DIAGNOSIS — F17200 Nicotine dependence, unspecified, uncomplicated: Secondary | ICD-10-CM

## 2022-07-08 ENCOUNTER — Ambulatory Visit
Admission: RE | Admit: 2022-07-08 | Discharge: 2022-07-08 | Disposition: A | Discharge status: Home / Self Care | Payer: Medicare HMO | Source: Ambulatory Visit | Attending: Internal Medicine | Admitting: Internal Medicine

## 2022-07-08 DIAGNOSIS — F17210 Nicotine dependence, cigarettes, uncomplicated: Secondary | ICD-10-CM | POA: Diagnosis not present

## 2022-07-08 DIAGNOSIS — F17200 Nicotine dependence, unspecified, uncomplicated: Secondary | ICD-10-CM

## 2022-07-09 IMAGING — DX DG CHEST 1V PORT
1 series · 2 of 2 positions shown · non-contrast
Comparison: 10/03/2010

CLINICAL DATA: History of [id number] positivity with shortness of
breath

EXAM:
PORTABLE CHEST 1 VIEW

[Series 1: chest · 0.14mm/px · 2 of 2 slices shown]
[im 1/2]
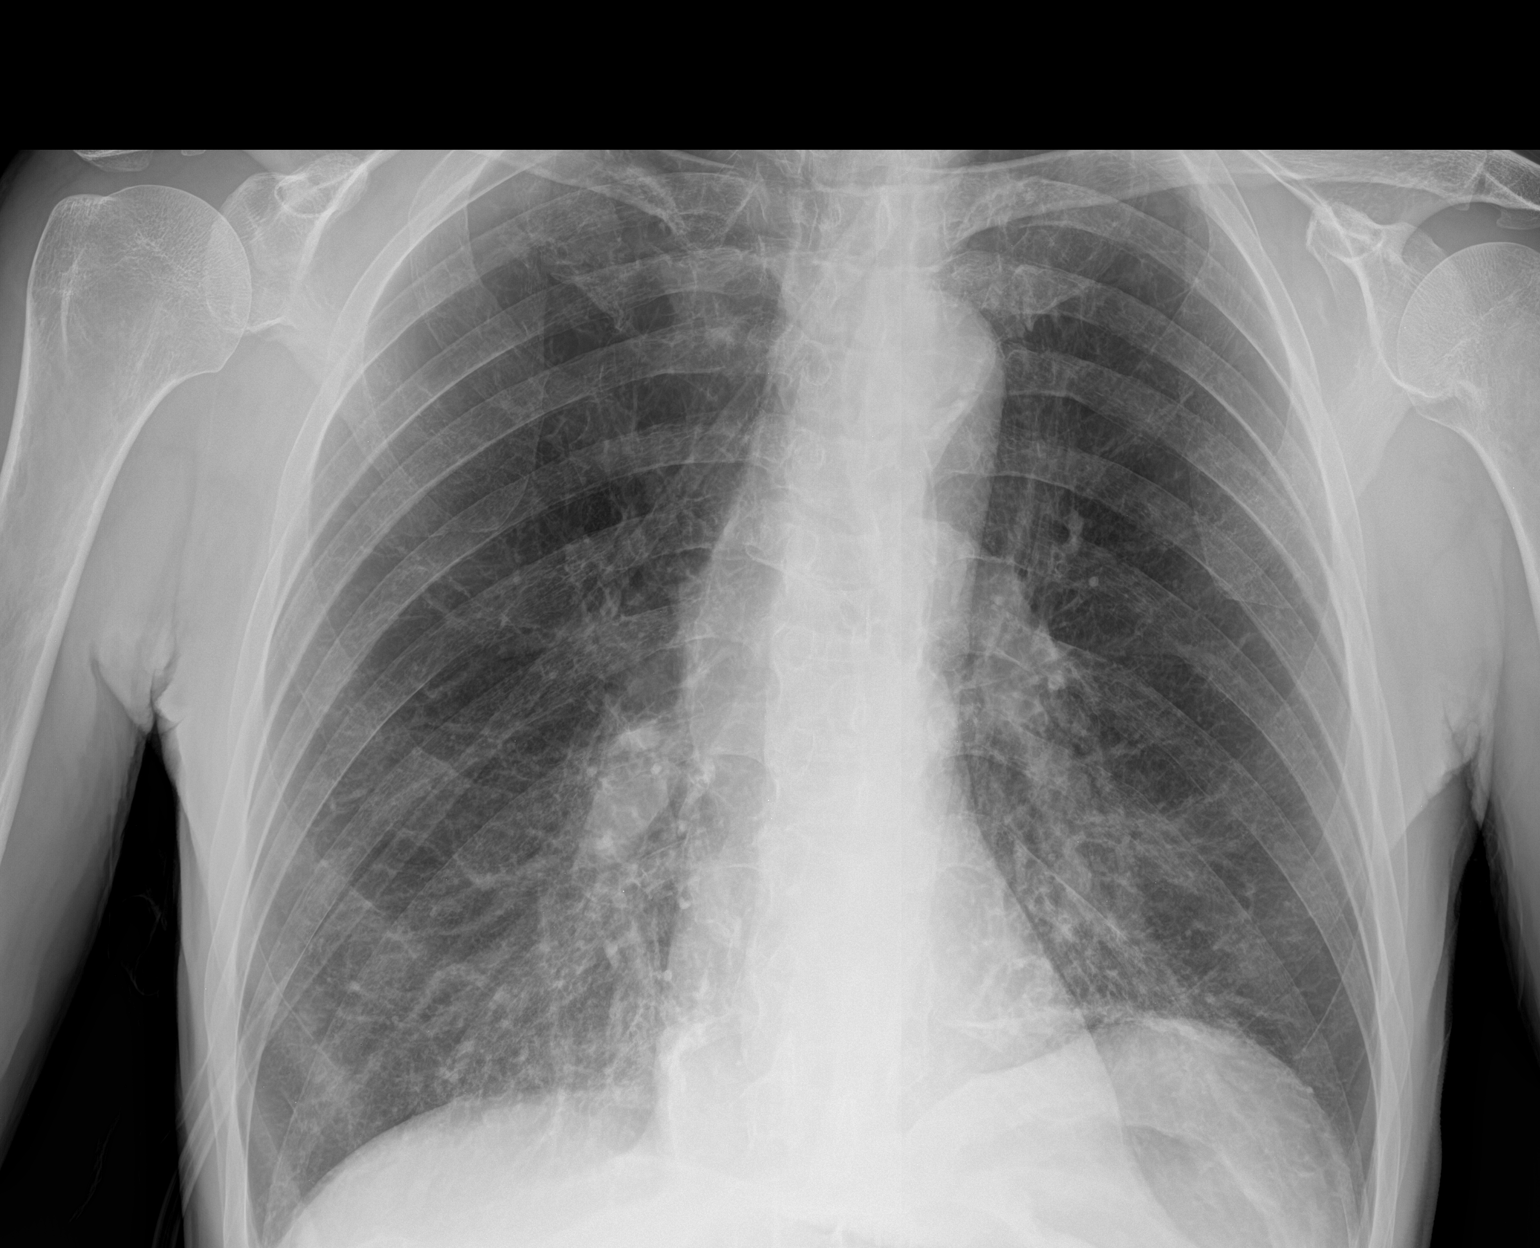
[im 2/2]
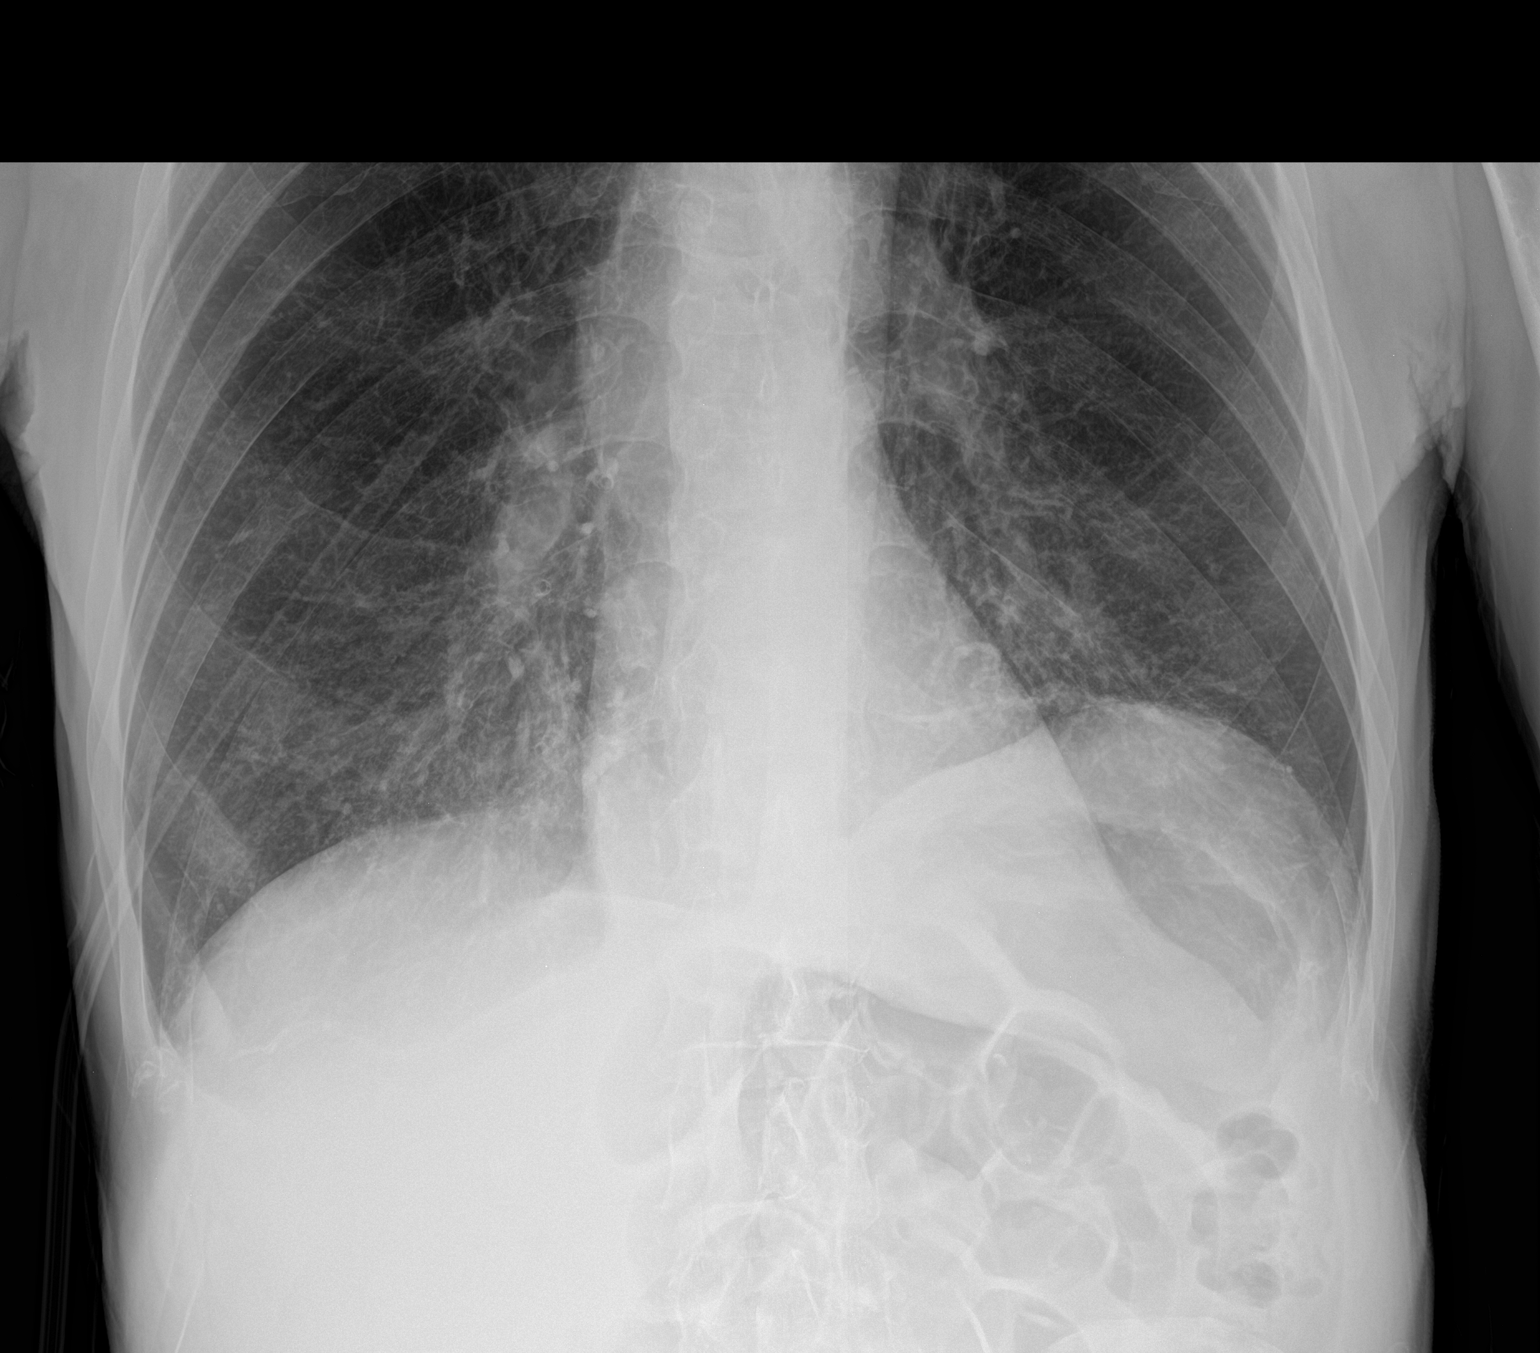

[2 of 2 positions shown; findings below may reference images not displayed]

FINDINGS: Cardiac shadow is within normal limits. Aortic calcifications are
noted. The lungs are hyperinflated with mild bibasilar atelectatic
changes. No focal confluent infiltrate is seen. No sizable effusion
is noted. No bony abnormality is seen.
IMPRESSION: Mild bibasilar atelectasis.

Changes of COPD.

## 2022-07-30 ENCOUNTER — Ambulatory Visit: Payer: Medicare HMO

## 2022-07-30 DIAGNOSIS — R5382 Chronic fatigue, unspecified: Secondary | ICD-10-CM

## 2022-07-30 DIAGNOSIS — G4710 Hypersomnia, unspecified: Secondary | ICD-10-CM

## 2022-08-08 DIAGNOSIS — G4710 Hypersomnia, unspecified: Secondary | ICD-10-CM | POA: Diagnosis not present

## 2022-08-09 ENCOUNTER — Telehealth: Payer: Self-pay | Admitting: Pulmonary Disease

## 2022-08-09 DIAGNOSIS — R0902 Hypoxemia: Secondary | ICD-10-CM

## 2022-08-09 NOTE — Telephone Encounter (Signed)
Call patient  Sleep study result  Date of study: 07/30/2022  Impression: Negative study for significant sleep disordered breathing Moderate oxygen desaturation  Recommendation: Consider an in lab study if there remains significant concern for sleep disordered breathing  Consider oxygen supplementation and obtain an overnight oximetry to ascertain adequate oxygen supplementation  Sleep position optimization by encouraging sleep in a lateral position, elevating head of the bed may help with snoring

## 2022-08-13 ENCOUNTER — Telehealth: Payer: Self-pay | Admitting: Pulmonary Disease

## 2022-08-13 DIAGNOSIS — J9611 Chronic respiratory failure with hypoxia: Secondary | ICD-10-CM

## 2022-08-13 DIAGNOSIS — J439 Emphysema, unspecified: Secondary | ICD-10-CM

## 2022-08-13 NOTE — Telephone Encounter (Signed)
Patient's sister is returning a call regarding patient's test results.  She would like the nurse to call back after 12 noon if possible.  CB# [phone]

## 2022-08-13 NOTE — Telephone Encounter (Signed)
I left a message for the patient to call back for his results.  ?

## 2022-08-14 NOTE — Telephone Encounter (Signed)
Called and spoke with pt's sister letting her know the results of HST and she verbalized understanding. Order placed for O2 and also for pt to have ONO at night on O2. Nothing further needed.

## 2022-08-20 NOTE — Telephone Encounter (Signed)
I called the POA and she reports that the patient feels he is having trouble catching his breath and needs this done ASAP.   I called [employer] and they do not accept his insurance and we need to place a new order.   I have sent a message to the [facility] to let them know as well. The caregiver is aware we will place a new order for [employer] so he can get his oxygen at home.

## 2022-08-20 NOTE — Addendum Note (Signed)
Addended by: Dessie Coma on: 08/20/2022 10:24 AM   Modules accepted: Orders

## 2022-08-22 DIAGNOSIS — D692 Other nonthrombocytopenic purpura: Secondary | ICD-10-CM | POA: Diagnosis not present

## 2022-08-22 DIAGNOSIS — J9611 Chronic respiratory failure with hypoxia: Secondary | ICD-10-CM | POA: Diagnosis not present

## 2022-08-22 DIAGNOSIS — R109 Unspecified abdominal pain: Secondary | ICD-10-CM | POA: Diagnosis not present

## 2022-08-22 DIAGNOSIS — I10 Essential (primary) hypertension: Secondary | ICD-10-CM | POA: Diagnosis not present

## 2022-08-22 DIAGNOSIS — I480 Paroxysmal atrial fibrillation: Secondary | ICD-10-CM | POA: Diagnosis not present

## 2022-08-22 DIAGNOSIS — R911 Solitary pulmonary nodule: Secondary | ICD-10-CM | POA: Diagnosis not present

## 2022-08-22 DIAGNOSIS — I739 Peripheral vascular disease, unspecified: Secondary | ICD-10-CM | POA: Diagnosis not present

## 2022-08-22 DIAGNOSIS — I700 Atherosclerosis of aorta: Secondary | ICD-10-CM | POA: Diagnosis not present

## 2022-08-22 DIAGNOSIS — R0602 Shortness of breath: Secondary | ICD-10-CM | POA: Diagnosis not present

## 2022-08-22 DIAGNOSIS — F321 Major depressive disorder, single episode, moderate: Secondary | ICD-10-CM | POA: Diagnosis not present

## 2022-08-22 DIAGNOSIS — E119 Type 2 diabetes mellitus without complications: Secondary | ICD-10-CM | POA: Diagnosis not present

## 2022-08-22 DIAGNOSIS — Z79899 Other long term (current) drug therapy: Secondary | ICD-10-CM | POA: Diagnosis not present

## 2022-08-22 DIAGNOSIS — G453 Amaurosis fugax: Secondary | ICD-10-CM | POA: Diagnosis not present

## 2022-08-22 DIAGNOSIS — R0989 Other specified symptoms and signs involving the circulatory and respiratory systems: Secondary | ICD-10-CM | POA: Diagnosis not present

## 2022-08-22 DIAGNOSIS — J449 Chronic obstructive pulmonary disease, unspecified: Secondary | ICD-10-CM | POA: Diagnosis not present

## 2022-08-23 ENCOUNTER — Other Ambulatory Visit: Payer: Self-pay | Admitting: Internal Medicine

## 2022-08-23 ENCOUNTER — Other Ambulatory Visit (HOSPITAL_COMMUNITY): Payer: Self-pay | Admitting: Internal Medicine

## 2022-08-29 ENCOUNTER — Ambulatory Visit: Payer: Medicare HMO | Admitting: Pulmonary Disease

## 2022-09-03 DIAGNOSIS — R0683 Snoring: Secondary | ICD-10-CM | POA: Diagnosis not present

## 2022-09-03 DIAGNOSIS — G4730 Sleep apnea, unspecified: Secondary | ICD-10-CM | POA: Diagnosis not present

## 2022-09-05 ENCOUNTER — Telehealth: Payer: Self-pay | Admitting: Pulmonary Disease

## 2022-09-05 DIAGNOSIS — J9611 Chronic respiratory failure with hypoxia: Secondary | ICD-10-CM

## 2022-09-05 NOTE — Telephone Encounter (Signed)
Called patient and he states when he took his overnight oxygen test he did not have his oxygen on as he was instructed to wear during the test.   Sir do you want to order new ONO?  Or wait till the results of the ONO come in?  Sir please advise

## 2022-09-05 NOTE — Telephone Encounter (Signed)
Called patient and informed him that he needs to do this new overnight oxygen test on his oxygen. He verbalized understanding. New order placed. Nothing further needed

## 2022-09-17 ENCOUNTER — Telehealth: Payer: Self-pay | Admitting: Pulmonary Disease

## 2022-09-17 ENCOUNTER — Ambulatory Visit: Payer: Medicare HMO | Admitting: Pulmonary Disease

## 2022-09-17 NOTE — Progress Notes (Deleted)
   Synopsis: Referred in August 2023 for Dyspnea by Dr. Thornell Mule.  He has a history of COPD diagnosed by spirometry in 2019.  He has smoked as much as 3 packs a day for most of his life, still smoking 2ppd when seen in consultation on 05/2022.  Subjective:   PATIENT ID: Manuel Richardson GENDER: male DOB: [birth year], MRN: [id number]   HPI  No chief complaint on file.  *** Breztri, spacer, sleep study  Past Medical History:  Diagnosis Date   COPD (chronic obstructive pulmonary disease) (HCC)    Erectile dysfunction    Full dentures    GERD (gastroesophageal reflux disease)    Hyperlipidemia    Tobacco use disorder       ROS ***   Objective:  Physical Exam   There were no vitals filed for this visit.  RA  05/30/2022 Walked 750 feet on room air and O2 saturation did not drop below 94%  ***   CBC    Component Value Date/Time   WBC 11.6 (H) 04/01/2021 0145   RBC 4.19 (L) 04/01/2021 0145   HGB 13.3 04/01/2021 0145   HCT 39.4 04/01/2021 0145   PLT 226 04/01/2021 0145   MCV 94.0 04/01/2021 0145   MCH 31.7 04/01/2021 0145   MCHC 33.8 04/01/2021 0145   RDW 13.5 04/01/2021 0145   LYMPHSABS 0.6 (L) 04/01/2021 0145   MONOABS 0.4 04/01/2021 0145   EOSABS 0.0 04/01/2021 0145   BASOSABS 0.0 04/01/2021 0145     Chest imaging: 2022 Lung cancer screening CT scan> lung RADS 2 67mm nodule RUL, emphysema 2022 chest x-ray showed bibasilar atelectasis  PFT: 2019 spirometry: Ratio 59%, FEV1 1.7 L 50 %  Labs: 03/2021 CBC Hgb 12.2 gm/dL  Path:  Echo:  Heart Catheterization:       Assessment & Plan:   No diagnosis found.  Discussion: ***    Current Outpatient Medications:    albuterol (PROAIR HFA) 108 (90 Base) MCG/ACT inhaler, Inhale 2 puffs into the lungs 3 (three) times daily. (Patient taking differently: Inhale 2 puffs into the lungs every 6 (six) hours as needed for wheezing or shortness of breath.), Disp: 1 Inhaler, Rfl: 1   apixaban (ELIQUIS) 5 MG TABS  tablet, Take 1 tablet (5 mg total) by mouth 2 (two) times daily., Disp: 60 tablet, Rfl: 0   atorvastatin (LIPITOR) 80 MG tablet, Take 1 tablet (80 mg total) by mouth daily., Disp: 30 tablet, Rfl: 0   BREZTRI AEROSPHERE 160-9-4.8 MCG/ACT AERO, Inhale 2 puffs into the lungs 2 (two) times daily., Disp: , Rfl:

## 2022-09-17 NOTE — Telephone Encounter (Signed)
Spoke with the pt's spouse  Pt to complete his ONO tonight  She cancelled his appt with BQ I tried to reschedule this but she said she will have to call us back

## 2022-09-18 DIAGNOSIS — R0683 Snoring: Secondary | ICD-10-CM | POA: Diagnosis not present

## 2022-09-18 DIAGNOSIS — G4730 Sleep apnea, unspecified: Secondary | ICD-10-CM | POA: Diagnosis not present

## 2023-03-25 DIAGNOSIS — Z125 Encounter for screening for malignant neoplasm of prostate: Secondary | ICD-10-CM | POA: Diagnosis not present

## 2023-03-25 DIAGNOSIS — I10 Essential (primary) hypertension: Secondary | ICD-10-CM | POA: Diagnosis not present

## 2023-03-25 DIAGNOSIS — E559 Vitamin D deficiency, unspecified: Secondary | ICD-10-CM | POA: Diagnosis not present

## 2023-03-25 DIAGNOSIS — N529 Male erectile dysfunction, unspecified: Secondary | ICD-10-CM | POA: Diagnosis not present

## 2023-03-25 DIAGNOSIS — R7989 Other specified abnormal findings of blood chemistry: Secondary | ICD-10-CM | POA: Diagnosis not present

## 2023-03-25 DIAGNOSIS — E119 Type 2 diabetes mellitus without complications: Secondary | ICD-10-CM | POA: Diagnosis not present

## 2023-03-28 DIAGNOSIS — D692 Other nonthrombocytopenic purpura: Secondary | ICD-10-CM | POA: Diagnosis not present

## 2023-03-28 DIAGNOSIS — I480 Paroxysmal atrial fibrillation: Secondary | ICD-10-CM | POA: Diagnosis not present

## 2023-03-28 DIAGNOSIS — E876 Hypokalemia: Secondary | ICD-10-CM | POA: Diagnosis not present

## 2023-03-28 DIAGNOSIS — E119 Type 2 diabetes mellitus without complications: Secondary | ICD-10-CM | POA: Diagnosis not present

## 2023-03-28 DIAGNOSIS — I700 Atherosclerosis of aorta: Secondary | ICD-10-CM | POA: Diagnosis not present

## 2023-03-28 DIAGNOSIS — G453 Amaurosis fugax: Secondary | ICD-10-CM | POA: Diagnosis not present

## 2023-03-28 DIAGNOSIS — Z0000 Encounter for general adult medical examination without abnormal findings: Secondary | ICD-10-CM | POA: Diagnosis not present

## 2023-03-28 DIAGNOSIS — I739 Peripheral vascular disease, unspecified: Secondary | ICD-10-CM | POA: Diagnosis not present

## 2023-03-28 DIAGNOSIS — I10 Essential (primary) hypertension: Secondary | ICD-10-CM | POA: Diagnosis not present

## 2023-03-28 DIAGNOSIS — E46 Unspecified protein-calorie malnutrition: Secondary | ICD-10-CM | POA: Diagnosis not present

## 2023-03-28 DIAGNOSIS — Z23 Encounter for immunization: Secondary | ICD-10-CM | POA: Diagnosis not present

## 2023-05-22 ENCOUNTER — Ambulatory Visit: Payer: Medicare HMO | Admitting: Cardiology

## 2023-05-22 ENCOUNTER — Encounter: Payer: Self-pay | Admitting: Cardiology

## 2023-05-22 VITALS — BP 160/86 | HR 79 | Resp 16 | Ht 69.0 in | Wt 146.0 lb

## 2023-05-22 DIAGNOSIS — I6522 Occlusion and stenosis of left carotid artery: Secondary | ICD-10-CM | POA: Diagnosis not present

## 2023-05-22 DIAGNOSIS — R0602 Shortness of breath: Secondary | ICD-10-CM

## 2023-05-22 DIAGNOSIS — I10 Essential (primary) hypertension: Secondary | ICD-10-CM

## 2023-05-22 DIAGNOSIS — E782 Mixed hyperlipidemia: Secondary | ICD-10-CM

## 2023-05-22 DIAGNOSIS — R011 Cardiac murmur, unspecified: Secondary | ICD-10-CM | POA: Diagnosis not present

## 2023-05-22 DIAGNOSIS — I6523 Occlusion and stenosis of bilateral carotid arteries: Secondary | ICD-10-CM | POA: Diagnosis not present

## 2023-05-22 MED ORDER — ROSUVASTATIN CALCIUM 20 MG PO TABS: 20.0000 mg | ORAL_TABLET | Freq: Every day | ORAL | 3 refills | Status: DC

## 2023-05-22 NOTE — Progress Notes (Signed)
Patient referred by Charlane Ferretti, DO for amaurosis fugax.  Subjective:   Manuel Richardson, male    DOB: [birth year], 67 y.o.   MRN: [id number]   Chief Complaint  Patient presents with   Transient Ischemic Attack   New Patient (Initial Visit)   Shortness of Breath     HPI  67 y.o. 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  Patient lives by himself, walks with his dog without any complaints of chest pain or shortness of breath.  It appears that his amaurosis fugax with transient loss of vision in the right eye occurred 4-5 years ago.  He denies any other TIA/stroke symptoms ever.  He has since then been evaluated by vascular surgery in 2022 at [facility]  CT angiogram showed extensive atherosclerosis in the carotid and subclavian arteries, details below.  He was also seen by cardiologist in 2022 at [facility] for what appeared to be an occasional episode of A-fib during albuterol inhalation in the setting of COPD and respiratory distress.  To his knowledge, he has not had any recurrent A-fib, but did not feel his original A-fib symptoms either.  Regardless, patient remains steadfast against taking most medications.  It appears that patient did have an episode of A-fib during albuterol inhalation in the setting of respiratory distress, with no further documented recurrence.  He was last seen by cardiologist at [facility] in 2022.  He has 100-pack-year smoking history, quit in 12/2022.   Past Medical History:  Diagnosis Date   COPD (chronic obstructive pulmonary disease) (HCC)    Erectile dysfunction    Full dentures    GERD (gastroesophageal reflux disease)    Hyperlipidemia    Tobacco use disorder      Past Surgical History:  Procedure Laterality Date   carotid artery surgery  2012   stent in left side    COLONOSCOPY  2007   ELBOW SURGERY  teenager   left     Social History   Tobacco Use  Smoking Status Every Day   Current packs/day: 3.00   Average packs/day: 3.0 packs/day for 49.0 years (147.0 ttl pk-yrs)   Types: Cigarettes  Smokeless Tobacco Never  Tobacco Comments   Decreased to 2 ppd 05/30/22 Vernie Murders, CMA    Social History   Substance and Sexual Activity  Alcohol Use Yes   Alcohol/week: 12.0 standard drinks of alcohol   Types: 12 Cans of beer per week     Family History  Problem Relation Age of Onset   Heart disease Mother        died 27 with MI   Cancer Father        lung cancer   Asthma Father    Lung cancer Father        smoked   Hypertension Brother    Diabetes Neg Hx    Stroke Neg Hx       Current Outpatient Medications:    BREZTRI AEROSPHERE 160-9-4.8 MCG/ACT AERO, Inhale 2 puffs into the lungs 2 (two) times daily., Disp: , Rfl:    aspirin EC 81 MG tablet, Take 81 mg by mouth daily., Disp: , Rfl:    Cardiovascular and other pertinent studies:  Reviewed external labs and tests, independently interpreted  EKG 05/22/2023: Sinus rhythm 65 bpm  Left atrial enlargement. Nonspecific ST depression -Nondiagnostic  CTA head/neck 05/07/2021: 1. 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  Extensive common carotid atheromatous plaque with ulcerations on the  right.  2. 70-80% right V1 segment stenosis.  3. Advanced proximal left subclavian atherosclerosis with 75%  stenosis and ulcerated plaque.  4. Left common carotid to subclavian bypass with approximately 65%  stenosis at the distal anastomosis.  5. Diffuse irregular plaque of the aorta.  6. Emphysema.   Aortic Atherosclerosis (ICD10-I70.0) and Emphysema (ICD10-J43.9).   Echocardiogram 03/28/2021: 1. Left ventricular ejection fraction, by estimation, is 55 to 60%. The  left ventricle has normal function. The left ventricle demonstrates  regional wall motion  abnormalities. The posterior wall, basal anterolateral segment, and basal inferior  segment are hypokinetic.  Left ventricular diastolic parameters are indeterminate.   2. Right ventricular systolic function is normal. The right ventricular  size is normal. There is normal pulmonary artery systolic pressure. The  estimated right ventricular systolic pressure is 30.3 mmHg.   3. The mitral valve is grossly normal. Moderate mitral valve  regurgitation - suspect mechanism is posterior leaflet restriction. No  evidence of mitral stenosis.   4. The aortic valve is normal in structure. Aortic valve regurgitation is  not visualized. No aortic stenosis is present.   5. The inferior vena cava is dilated in size with >50% respiratory  variability, suggesting right atrial pressure of 8 mmHg.    Recent labs: 03/25/2023: Glucose 105, BUN/Cr 20/10. EGFR 74.8. Na/K 137/5.5. Rest of the CMP normal H/H 15.4/46.3. MCV 94.5. Platelets 176 HbA1C 5.8% Chol 241, TG 229, HDL 48, LDL 147   Review of Systems  Cardiovascular:  Negative for chest pain, dyspnea on exertion, leg swelling, palpitations and syncope.         Vitals:   05/22/23 1356 05/22/23 1407  BP: (!) 152/83 (!) 172/91  Pulse: 71 79  Resp: 16   SpO2: 93%      Body mass index is 21.56 kg/m. Filed Weights   05/22/23 1356  Weight: 146 lb (66.2 kg)     Objective:   Physical Exam Vitals and nursing note reviewed.  Constitutional:      General: He is not in acute distress. Neck:     Vascular: No JVD.  Cardiovascular:     Rate and Rhythm: Normal rate and regular rhythm.     Pulses:          Carotid pulses are  on the right side with bruit and  on the left side with bruit.      Radial pulses are 1+ on the right side and 1+ on the left side.       Dorsalis pedis pulses are 0 on the right side and 1+ on the left side.       Posterior tibial pulses are 0 on the right side and 0 on the left side.     Heart sounds: Murmur heard.      Harsh midsystolic murmur is present with a grade of 2/6 at the upper right sternal border radiating to the neck.     Comments: Bilateral subclavian bruit Pulmonary:     Effort: Pulmonary effort is normal.     Breath sounds: Normal breath sounds. No wheezing or rales.  Musculoskeletal:     Right lower leg: No edema.     Left lower leg: No edema.         Visit diagnoses:   ICD-10-CM   1. SOB (shortness of breath)  R06.02     2. Essential hypertension, benign  I10 EKG 12-Lead    Basic metabolic panel    3. Stenosis of left carotid artery  I65.22     4. Murmur  R01.1 PCV ECHOCARDIOGRAM COMPLETE    5. Bilateral carotid artery stenosis  I65.23 CT ANGIO HEAD W OR WO CONTRAST    6. Mixed hyperlipidemia  E78.2 Lipid panel       Orders Placed This Encounter  Procedures   CT ANGIO HEAD W OR WO CONTRAST   Lipid panel   Basic metabolic panel   EKG 12-Lead   PCV ECHOCARDIOGRAM COMPLETE     Medication changes this visit: Medications Discontinued During This Encounter  Medication Reason   apixaban (ELIQUIS) 5 MG TABS tablet    atorvastatin (LIPITOR) 80 MG tablet    albuterol (PROAIR HFA) 108 (90 Base) MCG/ACT inhaler     Meds ordered this encounter  Medications   rosuvastatin (CRESTOR) 20 MG tablet    Sig: Take 1 tablet (20 mg total) by mouth daily.    Dispense:  30 tablet    Refill:  3     Assessment & Recommendations:   67 y.o. Caucasian male with hypertension, hyperlipidemia, prior history of A-fib, h/o left carotid to subclavian bypass, severe left subclavian stenosis, COPD, nicotine dependence, h/o amaurosis fugax.  Carotid/subclavian stenosis: Extensive atherosclerosis noted on CTA in 2022 with prior left carotid-subclavian bypass. He has bilateral carotid and subclavian bruits. He has multiple causes for amarosis fugax or any other forms of TIA. It appears that his amarosis fugax was around 2022, and has not had any recent symptoms. Regardless, he needs aggressive  risk factor modification. Unfortunately, he is very "hard headed" (in her own words) and not keen on taking any or many medications. After much discussion, I have convinced him to continue Aspirin 81 mg daily, and try statin at least once again (he states it made him "feel bad: in the past). Started Cretor 20 mg daily. I also recommend repeat CTA head/neck to re evaluate his carotid and subclavian vasculature. Blood pressure control would also be important, but I would like to assess severity of carotid stenoses before aggressively lowering his blood pressure.   Systolic murmur: Suspect mild aortic stenosis. Will check echocardiogram.  H/o Afib: Reported episode after inhalation of albuterol in 2022. He had no symptoms then and remains unclear if he had any recurrence since then. He wore Zio patch in 2022 through [facility] that did not show Afib. He is absolutely not in favor ir restarting Eliquis or any other anticoagulation. Will continue to address in future visits.  Further recommendations after CTA, echocardiogram, and repeat lipid panel in a month.  Thank you for referring the patient to [place]. Please feel free to contact with any questions.   Elder Negus, MD Pager: [phone] Office: [phone]

## 2023-06-10 ENCOUNTER — Ambulatory Visit: Payer: Medicare HMO

## 2023-06-10 DIAGNOSIS — R011 Cardiac murmur, unspecified: Secondary | ICD-10-CM | POA: Diagnosis not present

## 2023-06-10 DIAGNOSIS — I10 Essential (primary) hypertension: Secondary | ICD-10-CM | POA: Diagnosis not present

## 2023-06-11 ENCOUNTER — Ambulatory Visit
Admission: RE | Admit: 2023-06-11 | Discharge: 2023-06-11 | Disposition: A | Discharge status: Home / Self Care | Payer: Medicare HMO | Source: Ambulatory Visit | Attending: Cardiology | Admitting: Cardiology

## 2023-06-11 DIAGNOSIS — I6523 Occlusion and stenosis of bilateral carotid arteries: Secondary | ICD-10-CM

## 2023-06-11 DIAGNOSIS — I10 Essential (primary) hypertension: Secondary | ICD-10-CM | POA: Diagnosis not present

## 2023-06-11 DIAGNOSIS — E782 Mixed hyperlipidemia: Secondary | ICD-10-CM | POA: Diagnosis not present

## 2023-06-11 MED ORDER — IOPAMIDOL (ISOVUE-370) INJECTION 76%
75.0000 mL | Freq: Once | INTRAVENOUS | Status: AC | PRN
  Administered 2023-06-11: 75 mL via INTRAVENOUS

## 2023-07-03 ENCOUNTER — Ambulatory Visit: Payer: Medicare HMO | Admitting: Cardiology

## 2023-07-30 ENCOUNTER — Encounter: Payer: Self-pay | Admitting: Cardiology

## 2023-07-30 ENCOUNTER — Ambulatory Visit: Payer: Medicare HMO | Attending: Cardiology | Admitting: Cardiology

## 2023-07-30 VITALS — BP 168/98 | HR 82 | Resp 16 | Ht 69.0 in | Wt 155.8 lb

## 2023-07-30 DIAGNOSIS — I739 Peripheral vascular disease, unspecified: Secondary | ICD-10-CM | POA: Diagnosis not present

## 2023-07-30 DIAGNOSIS — I10 Essential (primary) hypertension: Secondary | ICD-10-CM

## 2023-07-30 DIAGNOSIS — E782 Mixed hyperlipidemia: Secondary | ICD-10-CM | POA: Diagnosis not present

## 2023-07-30 MED ORDER — LOSARTAN POTASSIUM 50 MG PO TABS: 50.0000 mg | ORAL_TABLET | Freq: Every day | ORAL | 3 refills | Status: DC

## 2023-07-30 NOTE — Progress Notes (Signed)
Cardiology Office Note:  .   Date:  07/30/2023  ID:  Manuel Richardson, DOB [birth year], MRN [id number] PCP: Charlane Ferretti, DO  [facility] Providers Cardiologist:  Truett Mainland, MD PCP: Charlane Ferretti, DO  Chief Complaint  Patient presents with   Shortness of Breath        Follow-up      History of Present Illness: .   Manuel Richardson is a 67 y.o. male with hypertension, hyperlipidemia, prior history of A-fib, h/o left carotid to subclavian bypass, severe left subclavian stenosis, COPD, nicotine dependence, h/o amaurosis fugax.  His only complaint today is unchanged exertional dyspnea.  He denies chest pain.  Discussed CT angiogram head/neck and echocardiogram as patient limited history.  Blood pressure elevated today, left greater than sign right arm. He has no other symptoms at this time.    Vitals:   07/30/23 1337 07/30/23 1340  BP: (!) 190/100 (!) 168/98  Pulse: 78 82  Resp: 16   SpO2: 94% 93%   Lt arm > Rt arm  ROS:  Review of Systems  Cardiovascular:  Positive for dyspnea on exertion. Negative for chest pain, leg swelling, palpitations and syncope.     Studies Reviewed: Marland Kitchen        EKG 07/30/2023: Normal sinus rhythm Possible Left atrial enlargement Cannot rule out Inferior infarct , age undetermined When compared with ECG of 28-Mar-2021 03:07, Afib is replaced by sinus rhythm   06/11/2023: Glucose 105, BUN/Cr 10/1.0. EGFR 79. K 4.4. Chol 155, TG 98, HDL 51, LDL 86  02/2023: HbA1C 5.8%  CTA head/neck 06/11/2023: 1. Occlusion of the cervical, petrous, and cavernous segments of the bilateral internal carotid arteries with reconstitution of the level of the supraclinoid ICAs via robust PCOM bilaterally. Although no prior imaging is available for comparison, this finding was present on prior report available in care everywhere dated 05/07/2021. 2. No acute intracranial abnormality.  Echocardiogram 06/10/2023:  Left ventricle cavity is normal in  size. Normal left ventricular wall  thickness. Normal global wall motion. Normal LV systolic function with EF  54%. Doppler evidence of grade I (impaired) diastolic dysfunction, normal  LAP.  Mild (Grade I) mitral regurgitation.  Normal right atrial pressure.     Risk Assessment/Calculations:    CHA2DS2-VASc Score =5, annual stroke risk 7.2%  Patient has opted to not be on anticoagulation.       Physical Exam:   Physical Exam Vitals and nursing note reviewed.  Constitutional:      General: He is not in acute distress. Neck:     Vascular: No JVD.  Cardiovascular:     Rate and Rhythm: Normal rate and regular rhythm.     Pulses:          Carotid pulses are  on the right side with bruit and  on the left side with bruit.    Heart sounds: Normal heart sounds. No murmur heard. Pulmonary:     Effort: Pulmonary effort is normal.     Breath sounds: Normal breath sounds. No wheezing or rales.  Musculoskeletal:     Right lower leg: No edema.     Left lower leg: No edema.      VISIT DIAGNOSES:   ICD-10-CM   1. PAD (peripheral artery disease) (HCC)  I73.9 EKG 12-Lead    Basic Metabolic Panel (BMET)    2. Essential hypertension, benign  I10     3. Mixed hyperlipidemia  E78.2        ASSESSMENT AND  PLAN: .    Manuel Richardson is a 67 y.o. male with hypertension, hyperlipidemia, prior history of A-fib, h/o left carotid to subclavian bypass, severe left subclavian stenosis, COPD, nicotine dependence, h/o amaurosis fugax.  Carotid/subclavian stenosis: Extensive atherosclerosis noted on CTA in 2022 with prior left carotid-subclavian bypass. CT angiogram head/neck showed occlusion of cervical, petrous, cavernous segments of bilateral internal carotid arteries with reconstitution at the level of supraclinoid ICAs via robust PCOM bilaterally.  This seems to be unchanged compared to 04/2021.  I do not think subclavian arteries are adequately evaluated on this CT angiogram. Continue  aspirin 81 mg daily. Increase Crestor from 20 mg daily to 40 mg daily.  Follow-up lipid panel with PCP  Hypertension: Uncontrolled. Started losartan 50 mg daily. Check BMP in 1 week. Increase hydration.  Exertional dyspnea: Unchanged.  Patient has known COPD.  There is no specific anginal symptoms at this time. Recommend continue management of COPD/asthma with PCP/pulmonology  H/o Afib: Reported episode after inhalation of albuterol in 2022. He had no symptoms then and remains unclear if he had any recurrence since then. He wore Zio patch in 2022 through [facility] that did not show Afib. He is absolutely not in favor ir restarting Eliquis or any other anticoagulation.     Meds ordered this encounter  Medications   losartan (COZAAR) 50 MG tablet    Sig: Take 1 tablet (50 mg total) by mouth daily.    Dispense:  90 tablet    Refill:  3     F/u in 6 months  Signed, Elder Negus, MD

## 2023-07-30 NOTE — Patient Instructions (Signed)
Medication Instructions:  START LOSARTAN 50 MG A DAY *If you need a refill on your cardiac medications before your next appointment, please call your pharmacy*   Lab Work: BMET IN ONE WEEK  If you have labs (blood work) drawn today and your tests are completely normal, you will receive your results only by: MyChart Message (if you have MyChart) OR A paper copy in the mail If you have any lab test that is abnormal or we need to change your treatment, we will call you to review the results.   Testing/Procedures:    Follow-Up: At [facility], you and your health needs are our priority.  As part of our continuing mission to provide you with exceptional heart care, we have created designated Provider Care Teams.  These Care Teams include your primary Cardiologist (physician) and Advanced Practice Providers (APPs -  Physician Assistants and Nurse Practitioners) who all work together to provide you with the care you need, when you need it.  We recommend signing up for the patient portal called "MyChart".  Sign up information is provided on this After Visit Summary.  MyChart is used to connect with patients for Virtual Visits (Telemedicine).  Patients are able to view lab/test results, encounter notes, upcoming appointments, etc.  Non-urgent messages can be sent to your provider as well.   To learn more about what you can do with MyChart, go to [email or web address].    Your next appointment:   DR [facility]       Other Instructions

## 2023-07-31 ENCOUNTER — Telehealth: Payer: Self-pay | Admitting: Cardiology

## 2023-07-31 NOTE — Telephone Encounter (Signed)
Called patient's sister back about message. Informed her that patient was started on Losartan and needs lab work in a week to make sure electrolytes and kidney function is okay after starting new medication. Patient's sister verbalized understanding.

## 2023-07-31 NOTE — Telephone Encounter (Signed)
New Message:       Patient's sister, Roanna Raider called. She said she was unable to come to patient's appointment yesterday. She wanted to know why patient needs lab work?sp

## 2023-08-06 ENCOUNTER — Ambulatory Visit: Payer: Medicare HMO | Attending: Cardiology

## 2023-08-06 DIAGNOSIS — I739 Peripheral vascular disease, unspecified: Secondary | ICD-10-CM | POA: Diagnosis not present

## 2023-08-07 LAB — BASIC METABOLIC PANEL
BUN/Creatinine Ratio: 11 (ref 10–24)
BUN: 12 mg/dL (ref 8–27)
CO2: 22 mmol/L (ref 20–29)
Calcium: 8.9 mg/dL (ref 8.6–10.2)
Chloride: 98 mmol/L (ref 96–106)
Creatinine, Ser: 1.08 mg/dL (ref 0.76–1.27)
Glucose: 170 mg/dL — ABNORMAL HIGH (ref 70–99)
Potassium: 4.6 mmol/L (ref 3.5–5.2)
Sodium: 133 mmol/L — ABNORMAL LOW (ref 134–144)
eGFR: 76 mL/min/{1.73_m2} (ref >59)

## 2023-08-28 DIAGNOSIS — I700 Atherosclerosis of aorta: Secondary | ICD-10-CM | POA: Diagnosis not present

## 2023-08-28 DIAGNOSIS — I480 Paroxysmal atrial fibrillation: Secondary | ICD-10-CM | POA: Diagnosis not present

## 2023-08-28 DIAGNOSIS — G252 Other specified forms of tremor: Secondary | ICD-10-CM | POA: Diagnosis not present

## 2023-08-28 DIAGNOSIS — G453 Amaurosis fugax: Secondary | ICD-10-CM | POA: Diagnosis not present

## 2023-08-28 DIAGNOSIS — J449 Chronic obstructive pulmonary disease, unspecified: Secondary | ICD-10-CM | POA: Diagnosis not present

## 2023-08-28 DIAGNOSIS — F4322 Adjustment disorder with anxiety: Secondary | ICD-10-CM | POA: Diagnosis not present

## 2023-08-28 DIAGNOSIS — I10 Essential (primary) hypertension: Secondary | ICD-10-CM | POA: Diagnosis not present

## 2023-08-28 DIAGNOSIS — D692 Other nonthrombocytopenic purpura: Secondary | ICD-10-CM | POA: Diagnosis not present

## 2023-08-28 DIAGNOSIS — E119 Type 2 diabetes mellitus without complications: Secondary | ICD-10-CM | POA: Diagnosis not present

## 2023-09-17 ENCOUNTER — Encounter: Payer: Self-pay | Admitting: Pulmonary Disease

## 2023-09-17 ENCOUNTER — Ambulatory Visit: Payer: Medicare HMO | Admitting: Pulmonary Disease

## 2023-09-17 VITALS — BP 140/80 | HR 112 | Temp 97.1°F | Ht 69.0 in | Wt 160.8 lb

## 2023-09-17 DIAGNOSIS — Z87891 Personal history of nicotine dependence: Secondary | ICD-10-CM

## 2023-09-17 DIAGNOSIS — G4734 Idiopathic sleep related nonobstructive alveolar hypoventilation: Secondary | ICD-10-CM

## 2023-09-17 DIAGNOSIS — R000 Tachycardia, unspecified: Secondary | ICD-10-CM

## 2023-09-17 DIAGNOSIS — J449 Chronic obstructive pulmonary disease, unspecified: Secondary | ICD-10-CM

## 2023-09-17 DIAGNOSIS — R0609 Other forms of dyspnea: Secondary | ICD-10-CM

## 2023-09-17 NOTE — Patient Instructions (Signed)
VISIT SUMMARY:  Today, we discussed your ongoing health concerns, including your COPD, high blood pressure, and the need for lung cancer screening. We reviewed your current medications and made plans for further tests and treatments to better manage your symptoms and overall health.  YOUR PLAN:  -CHRONIC OBSTRUCTIVE PULMONARY DISEASE (COPD): COPD is a chronic lung condition that makes it hard to breathe. Your symptoms have worsened since your COVID-19 infection. We will continue your current medications (Breztri and albuterol) and consider a new nebulized medication after your pulmonary function test. It's important to use your nocturnal oxygen to prevent heart stress. We will also check your oxygen levels at night without supplemental oxygen and refer you to pulmonary rehabilitation after a cardiac evaluation.  -LUNG CANCER SCREENING: Lung cancer screening is important for early detection of lung cancer. You are overdue for this screening, so we will order a lung cancer screening CT scan.  -HYPERTENSION: Hypertension, or high blood pressure, is being managed with medication. We will continue to monitor your blood pressure and review your current medication to ensure it is effective.  -GENERAL HEALTH MAINTENANCE: You stated you have had your flu shot this year. Please ensure you are up-to-date on all your vaccinations to maintain your overall health.  INSTRUCTIONS:  Please schedule a follow-up appointment in 4-6 weeks. We will also arrange for an echocardiogram, a pulmonary function test, and a lung cancer screening CT scan. Additionally, we will check your nocturnal oxygen levels without supplemental oxygen.

## 2023-09-17 NOTE — Progress Notes (Signed)
Subjective:    Patient ID: Manuel Richardson, male    DOB: 07/07/1956, 67 y.o.   MRN: [id number]  Patient Care Team: Charlane Ferretti, DO as PCP - General (Internal Medicine)  Chief Complaint  Patient presents with   Follow-up    DOE. No wheezing. Cough with clear sputum.   BACKGROUND: Patient is a 67 year old former smoker with 100-pack-year history of smoking and a history as noted below who presents for evaluation of dyspnea on exertion and fatigue.  Initially noted in May 2022 after COVID-19 infection.  Easily evaluated by Dr. Max Fickle in August 2023 and no follow-up since then.  Patient has been on Breztri and albuterol for his dyspnea but does not note that this is helpful.  Spirometry performed 3 showed FEV1 of 1.49 L or 45% predicted, FVC of 3.0 L or 70% predicted, and FEV1/FVC of 49%.  Consistent with moderate to severe airway obstruction.  HPI Discussed the use of AI scribe software for clinical note transcription with the patient, who gave verbal consent to proceed.  History of Present Illness   The patient is a 67 year old individual with a history of COPD and a former smoking habit. He was previously under the care of Dr. Kendrick Fries but has been lost to follow-up. The patient reports experiencing dyspnea upon exertion, such as walking to the mailbox, which has been ongoing since his COVID-19 infection in 2022. He does not recall participating in any pulmonary rehabilitation post-COVID.  The patient was previously on nocturnal oxygen therapy but has not been consistent with its use, despite having a machine at home. He is currently on Breztri and Albuterol, which he reports as not providing any relief.  The patient had a cardiology check-up a few months ago, where he was diagnosed with high blood pressure and started on medication. An echocardiogram was performed in 2022, and he recalls having a CT lung scan around the same time.  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 He reports occasional coughing, mostly in the morning, with occasional sputum production.     Review of Systems A 10 point review of systems was performed and it is as noted above otherwise negative.   Past Medical History:  Diagnosis Date   COPD (chronic obstructive pulmonary disease) (HCC)    Erectile dysfunction    Full dentures    GERD (gastroesophageal reflux disease)    Hyperlipidemia    Tobacco use disorder     Past Surgical History:  Procedure Laterality Date   carotid artery surgery  2012   stent in left side   COLONOSCOPY  2007   ELBOW SURGERY  teenager   left    Patient Active Problem List   Diagnosis Date Noted   Atrial fibrillation with RVR (HCC) 03/28/2021   COVID-19 virus infection 03/28/2021   PAD (peripheral artery disease) (HCC) 09/21/2018   Bilateral carotid bruits 09/21/2018   Tobacco use disorder 07/18/2014   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/26/2012   Atherosclerosis 05/26/2012   ED (erectile dysfunction) 03/30/2012   Essential hypertension, benign 03/30/2012   Annual physical exam 03/05/2011   Hyperlipidemia 03/05/2011   Carotid artery disease (HCC) 03/05/2011    Family History  Problem Relation Age of Onset   Heart disease Mother        died 60 with MI   Cancer Father        lung  cancer   Asthma Father    Lung cancer Father        smoked   Hypertension Brother    Diabetes Neg Hx    Stroke Neg Hx     Social History   Tobacco Use   Smoking status: Former    Current packs/day: 0.00    Average packs/day: 3.0 packs/day for 54.0 years (162.0 ttl pk-yrs)    Types: Cigarettes    Start date: 42    Quit date: 2024    Years since quitting: 0.8   Smokeless tobacco: Never   Tobacco comments:    Quit 12/28/2022 khj 09/17/2023    Smoked for 53 years.     Smoked 3 PPD at his heaviest  Substance Use Topics   Alcohol use: Yes    Alcohol/week:  12.0 standard drinks of alcohol    Types: 12 Cans of beer per week    Comment: occ    No Known Allergies  Current Meds  Medication Sig   albuterol (VENTOLIN HFA) 108 (90 Base) MCG/ACT inhaler Inhale 2 puffs into the lungs every 4 (four) hours as needed.   aspirin EC 81 MG tablet Take 81 mg by mouth daily.   BREZTRI AEROSPHERE 160-9-4.8 MCG/ACT AERO Inhale 2 puffs into the lungs 2 (two) times daily.   losartan (COZAAR) 50 MG tablet Take 1 tablet (50 mg total) by mouth daily.    Immunization History  Administered Date(s) Administered   Influenza,inj,Quad PF,6+ Mos 07/18/2014   Pneumococcal Polysaccharide-23 03/05/2011   Tdap 03/05/2011        Objective:  BP (!) 140/80 (BP Location: Right Arm, Cuff Size: Normal)   Pulse (!) 112   Temp (!) 97.1 F (36.2 C)   Ht 5\' 9"  (1.753 m)   Wt 160 lb 12.8 oz (72.9 kg)   SpO2 91%   BMI 23.75 kg/m   SpO2: 91 % O2 Device: None (Room air)  GENERAL: Awake, alert, fully ambulatory.  No use of accessory use noted.  No conversational dyspnea. HEAD: Normocephalic, atraumatic.  EYES: Pupils equal, round, reactive to light.  No scleral icterus.  MOUTH: Few chipped teeth, oral mucosa moist.  Pharynx clear. NECK: Supple. No thyromegaly. Trachea midline. No JVD.  No adenopathy. PULMONARY: Good air entry bilaterally.  Coarse, few end expiratory wheezes. CARDIOVASCULAR: S1 and S2. Regular rate and rhythm.  No rubs, murmurs or gallops heard. ABDOMEN: Mild truncal obesity. MUSCULOSKELETAL: No joint deformity, no clubbing, no edema.  NEUROLOGIC: No overt focal deficit, no gait disturbance, speech is fluent. SKIN: Intact,warm,dry. PSYCH: Mood and behavior normal  Ambulatory oxymetry was performed today:  At rest on room air oxygen saturation was 93%, the patient ambulated at a fast pace, completed the laps, O2 nadir 93%, mild shortness of breath.  Resting heart rate was 103 bpm at maximum for this exercise 118 bpm.       Assessment & Plan:      ICD-10-CM   1. Stage 3 severe COPD by GOLD classification (HCC)  J44.9 Pulmonary function test    Overnight Pulse Oximetry Study    2. Nocturnal hypoxemia  G47.34 Overnight Pulse Oximetry Study    3. Dyspnea on exertion  R06.09 Pulmonary function test    ECHOCARDIOGRAM COMPLETE    4. Former heavy cigarette smoker (20-39 per day)  Z87.891 Ambulatory Referral for Lung Cancer Scre    5. Tachycardia  R00.0      Orders Placed This Encounter  Procedures   Ambulatory Referral for Lung Cancer  Scre    Referral Priority:   Routine    Referral Type:   Consultation    Referral Reason:   Specialty Services Required    Number of Visits Requested:   1   Overnight Pulse Oximetry Study    On room air DME: [employer]    Standing Status:   Future    Standing Expiration Date:   09/16/2024   ECHOCARDIOGRAM COMPLETE    Standing Status:   Future    Standing Expiration Date:   09/16/2024    Order Specific Question:   Where should this test be performed    Answer:   [facility]    Order Specific Question:   Perflutren DEFINITY (image enhancing agent) should be administered unless hypersensitivity or allergy exist    Answer:   Administer Perflutren    Order Specific Question:   Reason for exam-Echo    Answer:   Dyspnea  R06.00   Pulmonary function test    Standing Status:   Future    Standing Expiration Date:   09/16/2024    Order Specific Question:   Where should this test be performed?    Answer:   [facility]   Discussion:    Chronic Obstructive Pulmonary Disease (COPD) Stage 3 COPD with significant dyspnea on exertion, exacerbated post-COVID-19 infection in 2022. Current medications (Breztri, albuterol) are insufficient. Previous echocardiogram (2022) indicated mitral regurgitation. Oxygen saturation at 93% during walk test; heart rate variable. Discussed potential new nebulized COPD medication (Esifentrine) pending pulmonary function test results. Emphasized the importance of nocturnal supplemental  oxygen to prevent cardiac stress, except during nocturnal oxygen test. - Order echocardiogram - Order pulmonary function test - Check nocturnal oxygen levels without supplemental oxygen - Continue Breztri and albuterol - Consider new COPD medication Esifentrine) post-pulmonary function test - Refer to pulmonary rehabilitation post-cardiac evaluation  Lung Cancer Screening Overdue for lung cancer screening, last performed in September 2023. - Order lung cancer screening CT  Hypertension Hypertension managed with medication. Last cardiology checkup a few months ago.  Noted to have baseline tachycardia, patient follows with cardiology - Monitor blood pressure - Review current antihypertensive medication  General Health Maintenance Received flu shot previously by primary MD. - Ensure up to date on all vaccinations  Follow-up - Follow-up appointment in 4-6 weeks.      Gailen Shelter, MD Advanced Bronchoscopy P[facility]    *This note was dictated using voice recognition software/Dragon.  Despite best efforts to proofread, errors can occur which can change the meaning. Any transcriptional errors that result from this process are unintentional and may not be fully corrected at the time of dictation.

## 2023-09-23 ENCOUNTER — Other Ambulatory Visit: Payer: Self-pay | Admitting: Cardiology

## 2023-10-03 ENCOUNTER — Other Ambulatory Visit: Payer: Self-pay | Admitting: *Deleted

## 2023-10-03 DIAGNOSIS — Z122 Encounter for screening for malignant neoplasm of respiratory organs: Secondary | ICD-10-CM

## 2023-10-03 DIAGNOSIS — Z87891 Personal history of nicotine dependence: Secondary | ICD-10-CM

## 2023-10-07 ENCOUNTER — Ambulatory Visit (INDEPENDENT_AMBULATORY_CARE_PROVIDER_SITE_OTHER): Payer: Medicare HMO | Admitting: Physician Assistant

## 2023-10-07 ENCOUNTER — Encounter: Payer: Self-pay | Admitting: Physician Assistant

## 2023-10-07 DIAGNOSIS — Z87891 Personal history of nicotine dependence: Secondary | ICD-10-CM | POA: Diagnosis not present

## 2023-10-07 NOTE — Progress Notes (Signed)
Virtual Visit via Telephone Note  I connected with Manuel Richardson on 10/07/23 at 9:59 AM by telephone and verified that I am speaking with the correct person using two identifiers.  Location: Patient: home Provider: working virtually from home   I discussed the limitations, risks, security and privacy concerns of performing an evaluation and management service by telephone and the availability of in person appointments. I also discussed with the patient that there may be a patient responsible charge related to this service. The patient expressed understanding and agreed to proceed.       Shared Decision Making Visit Lung Cancer Screening Program [phone])   Eligibility: Age 74 Pack Years Smoking History Calculation 125 (# packs/per year x # years smoked) Recent History of coughing up blood  no Unexplained weight loss? No ( >Than 15 pounds within the last 6 months ) Prior History Lung / other cancer No (Diagnosis within the last 5 years already requiring surveillance chest CT Scans). Smoking Status Former Smoker Former Smokers: Years since quit: < 1 year  Quit Date: 2024  Visit Components: Discussion included one or more decision making aids? Yes Discussion included risk/benefits of screening. Yes Discussion included potential follow up diagnostic testing for abnormal scans. Yes Discussion included meaning and risk of over diagnosis. Yes Discussion included meaning and risk of False Positives. Yes Discussion included meaning of total radiation exposure. Yes  Counseling Included: Importance of adherence to annual lung cancer LDCT screening. Yes Impact of comorbidities on ability to participate in the program. Yes Ability and willingness to under diagnostic treatment. Yes  Smoking Cessation Counseling: Former Smokers:  Discussed the importance of maintaining cigarette abstinence. Yes Diagnosis Code: Personal History of Nicotine Dependence. U04.540 Information about tobacco  cessation classes and interventions provided to patient. Yes Written Order for Lung Cancer Screening with LDCT placed in Epic. Yes (CT Chest Lung Cancer Screening Low Dose W/O CM) [id number] Z12.2-Screening of respiratory organs Z87.891-Personal history of nicotine dependence    I spent 25 minutes of face to face time/virtual visit time  with the patient discussing the risks and benefits of lung cancer screening. We took the time to pause at intervals to allow for questions to be asked and answered to ensure understanding. We discussed that they had taken the single most powerful action possible to decrease their risk of developing lung cancer when they quit smoking. I counseled them to remain smoke free, and to contact the office if they ever had the desire to smoke again so that we can provide resources and tools to help support the effort to remain smoke free. We discussed the time and location of the scan, and they  will receive a call or letter with the results within  24-72 hours of receiving them. They have the office contact information in the event they have questions.   They verbalized understanding of all of the above and had no further questions.    I explained to the patient that there has been a high incidence of coronary artery disease noted on these exams. I explained that this is a non-gated exam therefore degree or severity cannot be determined. This patient is on statin therapy. I have asked the patient to follow-up with their PCP regarding any incidental finding of coronary artery disease and management with diet or medication as they feel is clinically indicated. The patient verbalized understanding of the above and had no further questions.      Darcella Gasman Keashia Haskins, PA-C

## 2023-10-07 NOTE — Patient Instructions (Signed)
 Thank you for participating in the  Lung Cancer Screening Program. It was our pleasure to meet you today. We will call you with the results of your scan within the next few days. Your scan will be assigned a Lung RADS category score by the physicians reading the scans.  This Lung RADS score determines follow up scanning.  See below for description of categories, and follow up screening recommendations. We will be in touch to schedule your follow up screening annually or based on recommendations of our providers. We will fax a copy of your scan results to your Primary Care Physician, or the physician who referred you to the program, to ensure they have the results. Please call the office if you have any questions or concerns regarding your scanning experience or results.  Our office number is [phone]. Please speak with Abigail Miyamoto, RN., Karlton Lemon RN, or Pietro Cassis RN. They are  our Lung Cancer Screening RN.'s If They are unavailable when you call, Please leave a message on the voice mail. We will return your call at our earliest convenience.This voice mail is monitored several times a day.  Remember, if your scan is normal, we will scan you annually as long as you continue to meet the criteria for the program. (Age 67-80, Current smoker or smoker who has quit within the last 15 years). If you are a smoker, remember, quitting is the single most powerful action that you can take to decrease your risk of lung cancer and other pulmonary, breathing related problems. We know quitting is hard, and we are here to help.  Please let us know if there is anything we can do to help you meet your goal of quitting. If you are a former smoker, Counselling psychologist. We are proud of you! Remain smoke free! Remember you can refer friends or family members through the number above.  We will screen them to make sure they meet criteria for the program. Thank you for helping [place] take better care of you  by participating in Lung Screening.   Lung RADS Categories:  Lung RADS 1: no nodules or definitely non-concerning nodules.  Recommendation is for a repeat annual scan in 12 months.  Lung RADS 2:  nodules that are non-concerning in appearance and behavior with a very low likelihood of becoming an active cancer. Recommendation is for a repeat annual scan in 12 months.  Lung RADS 3: nodules that are probably non-concerning , includes nodules with a low likelihood of becoming an active cancer.  Recommendation is for a 40-month repeat screening scan. Often noted after an upper respiratory illness. We will be in touch to make sure you have no questions, and to schedule your 67-month scan.  Lung RADS 4 A: nodules with concerning findings, recommendation is most often for a follow up scan in 3 months or additional testing based on our provider's assessment of the scan. We will be in touch to make sure you have no questions and to schedule the recommended 3 month follow up scan.  Lung RADS 4 B:  indicates findings that are concerning. We will be in touch with you to schedule additional diagnostic testing based on our provider's  assessment of the scan.  You can receive free nicotine replacement therapy ( patches, gum or mints) by calling 1-800-QUIT NOW. Please call so we can get you on the path to becoming  a non-smoker. I know it is hard, but you can do this!  Other options for assistance in  smoking cessation ( As covered by your insurance benefits)  Hypnosis for smoking cessation  [employer] [phone]  Acupuncture for smoking cessation  [employer] [phone]

## 2023-10-09 ENCOUNTER — Ambulatory Visit (HOSPITAL_COMMUNITY)
Admission: RE | Admit: 2023-10-09 | Discharge: 2023-10-09 | Disposition: A | Discharge status: Home / Self Care | Payer: Medicare HMO | Source: Ambulatory Visit | Attending: Pulmonary Disease | Admitting: Pulmonary Disease

## 2023-10-09 DIAGNOSIS — R0609 Other forms of dyspnea: Secondary | ICD-10-CM | POA: Insufficient documentation

## 2023-10-09 LAB — ECHOCARDIOGRAM COMPLETE
AR max vel: 2.48 cm2
AV Area VTI: 2.45 cm2
AV Area mean vel: 2.58 cm2
AV Mean grad: 2 mm[Hg]
AV Peak grad: 3.5 mm[Hg]
Ao pk vel: 0.94 m/s
Area-P 1/2: 2.77 cm2
MV VTI: 1.93 cm2
S' Lateral: 3.7 cm

## 2023-10-09 NOTE — Progress Notes (Signed)
*  PRELIMINARY RESULTS* Echocardiogram 2D Echocardiogram has been performed.  Carolyne Fiscal 10/09/2023, 3:53 PM

## 2023-10-10 ENCOUNTER — Ambulatory Visit (HOSPITAL_BASED_OUTPATIENT_CLINIC_OR_DEPARTMENT_OTHER)
Admission: RE | Admit: 2023-10-10 | Discharge: 2023-10-10 | Disposition: A | Discharge status: Home / Self Care | Payer: Medicare HMO | Source: Ambulatory Visit | Attending: Acute Care | Admitting: Acute Care

## 2023-10-10 ENCOUNTER — Ambulatory Visit (HOSPITAL_BASED_OUTPATIENT_CLINIC_OR_DEPARTMENT_OTHER): Payer: Medicare HMO

## 2023-10-10 DIAGNOSIS — Z122 Encounter for screening for malignant neoplasm of respiratory organs: Secondary | ICD-10-CM | POA: Diagnosis not present

## 2023-10-10 DIAGNOSIS — Z87891 Personal history of nicotine dependence: Secondary | ICD-10-CM | POA: Diagnosis not present

## 2023-10-14 ENCOUNTER — Telehealth: Payer: Self-pay | Admitting: Pulmonary Disease

## 2023-10-14 NOTE — Telephone Encounter (Signed)
Patient would like to know the results of his echo as well as Ct Scan. Patient's sister can be reached at [phone]

## 2023-10-14 NOTE — Telephone Encounter (Signed)
I spoke with the patient's sister(DPR). I had called her on Friday and went over his Echo results. I went over them again on the phone today. I did tell her the CT results are not back and the results would not be back for at least 3 weeks due to a shortage of Radiologist. I told her someone from lung cancer screening program will contact her once the results are back.  Maralyn Sago I am Just sending this message as an FYI.  Nothing further needed.

## 2023-10-23 ENCOUNTER — Telehealth: Payer: Self-pay | Admitting: Acute Care

## 2023-10-23 DIAGNOSIS — R911 Solitary pulmonary nodule: Secondary | ICD-10-CM

## 2023-10-23 NOTE — Telephone Encounter (Signed)
Called and spoke to pt's POA/sister, Cordelia Pen (On DPR) regarding LDCT results. We discussed the new 5.25mm nodule with recs to have 6 month follow up scan. We also discussed the other findings as well. Order placed for 6 month follow up scan. Results and plan sent to PCP.    IMPRESSION: 1. Lung-RADS 3S, probably benign findings. Short-term follow-up in 6 months is recommended with repeat low-dose chest CT without contrast (please use the following order, "CT CHEST LCS NODULE FOLLOW-UP W/O CM"). 2. The "S" modifier above refers to potentially clinically significant non lung cancer related findings. Specifically, there is aortic atherosclerosis, in addition to left main and three-vessel coronary artery disease. Please note that although the presence of coronary artery calcium documents the presence of coronary artery disease, the severity of this disease and any potential stenosis cannot be assessed on this non-gated CT examination. Assessment for potential risk factor modification, dietary therapy or pharmacologic therapy may be warranted, if clinically indicated. 3. Mild diffuse bronchial wall thickening with severe centrilobular and paraseptal emphysema; imaging findings suggestive of underlying COPD.   Aortic Atherosclerosis (ICD10-I70.0) and Emphysema (ICD10-J43.9).     Electronically Signed   By: Trudie Reed M.D.   On: 10/23/2023 10:39

## 2023-10-30 ENCOUNTER — Ambulatory Visit (INDEPENDENT_AMBULATORY_CARE_PROVIDER_SITE_OTHER): Payer: HMO | Admitting: Pulmonary Disease

## 2023-10-30 ENCOUNTER — Encounter: Payer: Self-pay | Admitting: Pulmonary Disease

## 2023-10-30 ENCOUNTER — Telehealth: Payer: Self-pay

## 2023-10-30 VITALS — BP 138/84 | HR 114 | Temp 96.9°F | Ht 69.0 in | Wt 164.6 lb

## 2023-10-30 DIAGNOSIS — R911 Solitary pulmonary nodule: Secondary | ICD-10-CM | POA: Diagnosis not present

## 2023-10-30 DIAGNOSIS — J439 Emphysema, unspecified: Secondary | ICD-10-CM

## 2023-10-30 DIAGNOSIS — I2510 Atherosclerotic heart disease of native coronary artery without angina pectoris: Secondary | ICD-10-CM

## 2023-10-30 DIAGNOSIS — J449 Chronic obstructive pulmonary disease, unspecified: Secondary | ICD-10-CM

## 2023-10-30 DIAGNOSIS — G4736 Sleep related hypoventilation in conditions classified elsewhere: Secondary | ICD-10-CM

## 2023-10-30 DIAGNOSIS — R0602 Shortness of breath: Secondary | ICD-10-CM | POA: Diagnosis not present

## 2023-10-30 MED ORDER — OHTUVAYRE 3 MG/2.5ML IN SUSP: 3.0000 mg | Freq: Two times a day (BID) | RESPIRATORY_TRACT | 11 refills | Status: DC

## 2023-10-30 NOTE — Telephone Encounter (Signed)
 Patient was seen in the office today. Dr. Jayme Cloud has ordered [facility] for the patient. He has signed the form and it has been faxed to the pharmacy team for completion.

## 2023-10-30 NOTE — Progress Notes (Addendum)
 Subjective:    Patient ID: Manuel Richardson, male    DOB: Oct 27, 1956, 68 y.o.   MRN: [id number]  Patient Care Team: Valentin Skates, DO as PCP - General (Internal Medicine)  Chief Complaint  Patient presents with   Follow-up    DOE. No wheezing or cough.     BACKGROUND/INTERVAL: Patient is a 68 year old former smoker with 100-pack-year history of smoking and a history as noted below who presents for evaluation of dyspnea on exertion and fatigue.  Initially noted in May 2022 after COVID-19 infection.  Previously evaluated by Dr. Vicenta Lennert in August 2023 and was to follow-up until 17 September 2023.  Patient has been on Breztri  and albuterol  for his dyspnea but does not note that this is helpful.  Spirometry performed 30 May 2022 showed FEV1 of 1.49 L or 45% predicted, FVC of 3.0 L or 70% predicted, and FEV1/FVC of 49%.  Consistent with moderate to severe airway obstruction.  Patient was first seen by me on  17 September 2023 this is a follow-up visit.  HPI Discussed the use of AI scribe software for clinical note transcription with the patient, who gave verbal consent to proceed.  History of Present Illness   The patient, with a known history of significant COPD, presents for a follow-up consultation regarding his dyspnea. He reports that his symptoms are well-managed when at rest. However, he has not undergone a recent breathing test, which was previously recommended.  We discussed the severity of his COPD, as confirmed by a recent CT scan for lung cancer screening, showing significant emphysema and a new lung nodule. This scan also revealed calcium  deposits in the coronary arteries, raising concerns about potential cardiac involvement in his dyspnea.  The patient admits to inconsistent use of nighttime oxygen , despite being advised of its importance for maintaining his oxygen  levels and reducing cardiac risk. He also reports dissatisfaction with his current COPD medication, Breztri ,  stating it provides no noticeable relief.  The patient's recent echocardiogram showed a slightly stiff but adequately pumping heart. However, the patient has not seen a cardiologist in several years. The patient's heart rate tends to increase rapidly during physical exertion, which may contribute to his dyspnea.  The small nodule, 5.7 mm, was identified in the patient's lung during the recent CT scan, necessitating a follow-up scan in six months.  This has already been scheduled.  Will be notified closer to the time due with the exact date and time.  DATA 05/30/2022 spirometry: FEV1 1.49 L or 45% predicted, FVC 3.0 L or 70% predicted, FEV1/FVC 49%.  This consistent with moderate to severe airway obstruction. 07/30/2022 Home sleep study: No evidence of sleep apnea, query nocturnal hypoxemia. 10/10/2023 LDCT chest: Lung RADS 3 yes, probably benign findings.  New right upper lobe pulmonary nodule, 5.7 mm in diameter, diffuse bronchial wall thickening with severe centrilobular and paraseptal emphysema.  There is significant left main and three-vessel coronary artery disease, cannot exclude significant coronary stenosis.  Recommend follow-up CT 6 months.  Review of Systems A 10 point review of systems was performed and it is as noted above otherwise negative.   Patient Active Problem List   Diagnosis Date Noted   Atrial fibrillation with RVR (HCC) 03/28/2021   COVID-19 virus infection 03/28/2021   PAD (peripheral artery disease) (HCC) 09/21/2018   Bilateral carotid bruits 09/21/2018   Tobacco use disorder 07/18/2014   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/26/2012   Atherosclerosis 05/26/2012   ED (erectile dysfunction) 03/30/2012  Essential hypertension, benign 03/30/2012   Annual physical exam 03/05/2011   Hyperlipidemia 03/05/2011   Carotid artery disease (HCC) 03/05/2011    Social History   Tobacco Use   Smoking status: Former    Current packs/day: 0.00    Average packs/day: 3.0 packs/day  for 54.0 years (162.0 ttl pk-yrs)    Types: Cigarettes    Start date: 74    Quit date: 2024    Years since quitting: 1.0   Smokeless tobacco: Never   Tobacco comments:    Quit 12/28/2022 khj 09/17/2023    Smoked for 53 years.     Smoked 3 PPD at his heaviest  Substance Use Topics   Alcohol use: Yes    Alcohol/week: 12.0 standard drinks of alcohol    Types: 12 Cans of beer per week    Comment: occ    No Known Allergies  Current Meds  Medication Sig   albuterol  (VENTOLIN  HFA) 108 (90 Base) MCG/ACT inhaler Inhale 2 puffs into the lungs every 4 (four) hours as needed.   aspirin  EC 81 MG tablet Take 81 mg by mouth daily.   BREZTRI  AEROSPHERE 160-9-4.8 MCG/ACT AERO Inhale 2 puffs into the lungs 2 (two) times daily.   Ensifentrine  (OHTUVAYRE ) 3 MG/2.5ML SUSP Take 3 mg by nebulization 2 (two) times daily.   losartan  (COZAAR ) 50 MG tablet Take 1 tablet (50 mg total) by mouth daily.   rosuvastatin  (CRESTOR ) 20 MG tablet TAKE 1 TABLET BY MOUTH EVERY DAY    Immunization History  Administered Date(s) Administered   Influenza,inj,Quad PF,6+ Mos 07/18/2014   Pneumococcal Polysaccharide-23 03/05/2011   Tdap 03/05/2011        Objective:     BP 138/84 (BP Location: Right Arm, Cuff Size: Normal)   Pulse (!) 114   Temp (!) 96.9 F (36.1 C)   Ht 5' 9 (1.753 m)   Wt 164 lb 9.6 oz (74.7 kg)   SpO2 94%   BMI 24.31 kg/m   SpO2: 94 % O2 Device: None (Room air)  GENERAL: Awake, alert, fully ambulatory.  No use of accessory use noted.  No conversational dyspnea. HEAD: Normocephalic, atraumatic.  EYES: Pupils equal, round, reactive to light.  No scleral icterus.  MOUTH: Few chipped teeth, oral mucosa moist.  Pharynx clear. NECK: Supple. No thyromegaly. Trachea midline. No JVD.  No adenopathy. PULMONARY: Good air entry bilaterally.  Coarse, few end expiratory wheezes. CARDIOVASCULAR: S1 and S2. Regular rate and rhythm.  No rubs, murmurs or gallops heard. ABDOMEN: Mild truncal  obesity. MUSCULOSKELETAL: No joint deformity, no clubbing, no edema.  NEUROLOGIC: No overt focal deficit, no gait disturbance, speech is fluent. SKIN: Intact,warm,dry. PSYCH: Mood and behavior normal      Ambulatory oxymetry was performed today:  At rest on room air oxygen  saturation was 93%, the patient ambulated at a moderate pace, completed 3 laps, O2 nadir 90%, moderate shortness of breath.  Resting heart rate was 95 bpm at maximum for this exercise 112 bpm.   Assessment & Plan:     ICD-10-CM   1. Stage 3 severe COPD by GOLD classification (HCC)  J44.9 Pulmonary Function Test [facility] Only    2. Nocturnal hypoxemia due to emphysema (HCC)  J43.9    G47.36     3. Lung nodule  R91.1     4. Coronary artery calcification  I25.10 Ambulatory referral to Cardiology    5. Shortness of breath  R06.02 Ambulatory referral to Cardiology    Pulmonary Function Test [facility] Only  Orders Placed This Encounter  Procedures   Ambulatory referral to Cardiology    Referral Priority:   Urgent    Referral Type:   Consultation    Referral Reason:   Specialty Services Required    Number of Visits Requested:   1   Pulmonary Function Test [facility] Only    Standing Status:   Future    Expiration Date:   10/29/2024    Full PFT: includes the following: basic spirometry, spirometry pre & post bronchodilator, diffusion capacity (DLCO), lung volumes:   Full PFT    This test can only be performed at:   [facility] ordered this encounter  Medications   Ensifentrine  (OHTUVAYRE ) 3 MG/2.5ML SUSP    Sig: Take 3 mg by nebulization 2 (two) times daily.    Dispense:  150 mL    Refill:  11   Discussion:    Chronic Obstructive Pulmonary Disease (COPD) Severe COPD with significant emphysema confirmed on lung cancer screening CT. Dyspnea reported. Non-compliance with nighttime oxygen  therapy noted. Breztri  inhaler reported as ineffective. Discussed the need for consistent use of nighttime oxygen  to  prevent hypoxemia and cardiac risk. Explained the importance of pulmonary function tests to assess lung function. Introduced Ohtuvayre  via nebulizer as a new treatment option, we will have to refer to specialty pharmacy. - Schedule pulmonary function test previously ordered - Ensure consistent use of nighttime oxygen  therapy - Schedule follow-up lung cancer screening CT in 6 months - Initiate Ohtuvayre  via nebulizer twice daily to specialty pharmacy - Evaluate for pulmonary rehabilitation post-cardiac assessment  Coronary Artery Disease (CAD) Calcium  deposits noted in coronary arteries on CT scan most notably on LAD. No recent cardiology follow-up. Discussed potential need for further evaluation with heart catheterization or CT study. Explained that blockages in coronary arteries can cause severe dyspnea, especially during exertion. Emphasized the need for cardiology evaluation to adjust medications for heart rate control and to synchronize heart and lung function. - Refer to cardiologist for evaluation of coronary artery disease - Coordinate with cardiologist to adjust medications for heart rate control - Ensure cardiac evaluation before starting pulmonary rehabilitation  Lung Nodule, 5.7 mm, RUL Has follow-up CT scheduled for 6 months  Follow-up - Schedule follow-up appointment with pulmonologist in 3-4 weeks - Ensure cardiologist appointment is scheduled promptly - Confirm completion of pulmonary function test before next visit.      Advised if symptoms do not improve or worsen, to please contact office for sooner follow up or seek emergency care.    I spent 60 minutes of dedicated to the care of this patient on the date of this encounter to include pre-visit review of records, face-to-face time with the patient discussing conditions above, post visit ordering of testing, clinical documentation with the electronic health record, making appropriate referrals as documented, and communicating  necessary findings to members of the patients care team.    C. Leita Sanders, MD Advanced Bronchoscopy PCCM [facility]    *This note was generated using voice recognition software/Dragon and/or AI transcription program.  Despite best efforts to proofread, errors can occur which can change the meaning. Any transcriptional errors that result from this process are unintentional and may not be fully corrected at the time of dictation.

## 2023-10-30 NOTE — Patient Instructions (Signed)
 VISIT SUMMARY:  You came in today for a follow-up on your COPD and to discuss your ongoing symptoms of shortness of breath. We reviewed your recent CT scan results, which showed severe COPD and calcium  deposits in your coronary arteries. We also discussed your current treatment plan and the need for further evaluations.  YOUR PLAN:  -CHRONIC OBSTRUCTIVE PULMONARY DISEASE (COPD): COPD is a chronic lung condition that makes it hard to breathe. Your CT scan confirmed severe COPD with significant emphysema. We discussed the importance of using your nighttime oxygen  consistently to maintain your oxygen  levels and reduce heart risks. We also talked about the need for a pulmonary function test to check your lung function. Since you are not finding relief with your current inhaler, we will start you on Ohtuvayre  via nebulizer twice daily, this will have to go through specialty pharmacy. We will also consider pulmonary rehabilitation after your heart is evaluated.  -CORONARY ARTERY DISEASE (CAD): Coronary Artery Disease is a condition where the arteries that supply blood to your heart have calcium  deposits, which can lead to blockages. This can cause severe shortness of breath, especially during physical activity. We discussed the need for a cardiology evaluation to possibly adjust your medications and to check for any blockages. This will help in managing your heart rate and improving your overall heart and lung function.  INSTRUCTIONS:  Please schedule a follow-up appointment with the pulmonologist in 3-4 weeks. Make sure to also schedule an appointment with a cardiologist as soon as possible. Complete the pulmonary function test before your next visit. Additionally, a follow-up lung cancer screening CT scan is needed in 6 months.

## 2023-10-30 NOTE — Addendum Note (Signed)
 Addended by: Salena Saner on: 10/30/2023 10:54 AM   Modules accepted: Orders

## 2023-10-31 ENCOUNTER — Telehealth: Payer: Self-pay | Admitting: Pulmonary Disease

## 2023-10-31 NOTE — Telephone Encounter (Signed)
 He should be evaluated by his radiologist specifically for potential blockages in his heart.  They would have to see him prior to a cardiac cath anyway.

## 2023-10-31 NOTE — Telephone Encounter (Signed)
 Cordelia Pen sister would like Dr. Jayme Cloud to call Dr. Joaquim Nam. Sherry phone number is [phone].

## 2023-10-31 NOTE — Telephone Encounter (Signed)
 I have notified Cordelia Pen.  Nothing further needed.

## 2023-10-31 NOTE — Telephone Encounter (Signed)
 I spoke with Manuel Richardson, she said he just saw his cardiologist in October. She wants to know if he will need to be seen again before he is scheduled for a Cardiac Cath or if you can talk to his Cardiologist and get it scheduled without him having to be seen again?

## 2023-11-04 ENCOUNTER — Ambulatory Visit: Payer: HMO | Attending: Pulmonary Disease

## 2023-11-04 DIAGNOSIS — J984 Other disorders of lung: Secondary | ICD-10-CM | POA: Insufficient documentation

## 2023-11-04 DIAGNOSIS — J449 Chronic obstructive pulmonary disease, unspecified: Secondary | ICD-10-CM

## 2023-11-04 DIAGNOSIS — Z87891 Personal history of nicotine dependence: Secondary | ICD-10-CM | POA: Diagnosis not present

## 2023-11-04 DIAGNOSIS — R0602 Shortness of breath: Secondary | ICD-10-CM | POA: Diagnosis not present

## 2023-11-04 DIAGNOSIS — R0609 Other forms of dyspnea: Secondary | ICD-10-CM | POA: Insufficient documentation

## 2023-11-04 LAB — PULMONARY FUNCTION TEST ARMC ONLY
DL/VA % pred: 31 %
DL/VA: 1.3 ml/min/mmHg/L
DLCO unc % pred: 24 %
DLCO unc: 6.38 ml/min/mmHg
FEF 25-75 Post: 0.79 L/s
FEF 25-75 Pre: 0.64 L/s
FEF2575-%Change-Post: 24 %
FEF2575-%Pred-Post: 31 %
FEF2575-%Pred-Pre: 25 %
FEV1-%Change-Post: 9 %
FEV1-%Pred-Post: 52 %
FEV1-%Pred-Pre: 48 %
FEV1-Post: 1.71 L
FEV1-Pre: 1.57 L
FEV1FVC-%Change-Post: 2 %
FEV1FVC-%Pred-Pre: 65 %
FEV6-%Change-Post: 6 %
FEV6-%Pred-Post: 80 %
FEV6-%Pred-Pre: 76 %
FEV6-Post: 3.33 L
FEV6-Pre: 3.14 L
FEV6FVC-%Change-Post: 0 %
FEV6FVC-%Pred-Post: 102 %
FEV6FVC-%Pred-Pre: 102 %
FVC-%Change-Post: 6 %
FVC-%Pred-Post: 78 %
FVC-%Pred-Pre: 73 %
FVC-Post: 3.43 L
FVC-Pre: 3.23 L
Post FEV1/FVC ratio: 50 %
Post FEV6/FVC ratio: 97 %
Pre FEV1/FVC ratio: 48 %
Pre FEV6/FVC Ratio: 97 %
RV % pred: 182 %
RV: 4.25 L
TLC % pred: 119 %
TLC: 8.16 L

## 2023-11-04 MED ORDER — ALBUTEROL SULFATE (2.5 MG/3ML) 0.083% IN NEBU
2.5000 mg | INHALATION_SOLUTION | Freq: Once | RESPIRATORY_TRACT | Status: AC
  Administered 2023-11-04: 2.5 mg via RESPIRATORY_TRACT
  Filled 2023-11-04: qty 3

## 2023-11-06 ENCOUNTER — Telehealth: Payer: Self-pay | Admitting: Pulmonary Disease

## 2023-11-06 NOTE — Telephone Encounter (Signed)
 Pt sister calling in about his Manuel Richardson

## 2023-11-06 NOTE — Telephone Encounter (Signed)
 Pt calling in bc the cardiologist appt is  supposed to be in [place] not [place]

## 2023-11-06 NOTE — Telephone Encounter (Signed)
 I sent message to the person that scheduled the appt asking them to change location

## 2023-11-07 NOTE — Telephone Encounter (Signed)
 This form has been received. Completed form and faxed to [facility] with clinicals and insurance card copy  Phone#: [phone] Fax#: [phone]  [facility] will reach out to patient once they are ready to discuss cost, etc  Sherry Pennant, PharmD, MPH, BCPS, CPP Clinical Pharmacist (Rheumatology and Pulmonology)

## 2023-11-07 NOTE — Telephone Encounter (Signed)
 Manuel Richardson, [name] I will give I'm a call today to reschedule. Thank you   Manuel Richardson, [name] I spoke with pt's sister/POA and they are going to keep appt for 1/13 at the [facility] office. Thanks

## 2023-11-10 ENCOUNTER — Ambulatory Visit: Payer: HMO | Attending: Cardiology | Admitting: Cardiology

## 2023-11-10 ENCOUNTER — Encounter: Payer: Self-pay | Admitting: Cardiology

## 2023-11-10 VITALS — BP 162/98 | HR 80 | Ht 69.0 in | Wt 167.2 lb

## 2023-11-10 DIAGNOSIS — I10 Essential (primary) hypertension: Secondary | ICD-10-CM | POA: Diagnosis not present

## 2023-11-10 DIAGNOSIS — I739 Peripheral vascular disease, unspecified: Secondary | ICD-10-CM

## 2023-11-10 DIAGNOSIS — R0609 Other forms of dyspnea: Secondary | ICD-10-CM | POA: Diagnosis not present

## 2023-11-10 DIAGNOSIS — I25118 Atherosclerotic heart disease of native coronary artery with other forms of angina pectoris: Secondary | ICD-10-CM

## 2023-11-10 DIAGNOSIS — I2510 Atherosclerotic heart disease of native coronary artery without angina pectoris: Secondary | ICD-10-CM | POA: Diagnosis not present

## 2023-11-10 DIAGNOSIS — J449 Chronic obstructive pulmonary disease, unspecified: Secondary | ICD-10-CM

## 2023-11-10 MED ORDER — METOPROLOL SUCCINATE ER 50 MG PO TB24: 50.0000 mg | ORAL_TABLET | Freq: Every day | ORAL | 2 refills | Status: DC

## 2023-11-10 NOTE — Patient Instructions (Signed)
 Medication Instructions:   START TAKING METOPROLOL  SUCCINATE (TOPROL  XL) 50 MG BY MOUTH DAILY  *If you need a refill on your cardiac medications before your next appointment, please call your pharmacy*   Testing/Procedures:   CARDIAC PET SCAN    Please report to Radiology at the [facility] Main Entrance 30 minutes early for your test.  [address] [place], [place] [address]     How to Prepare for Your Cardiac PET/CT Stress Test:  Nothing to eat or drink, except water, 3 hours prior to arrival time.  NO caffeine/decaffeinated products, or chocolate 12 hours prior to arrival. (Please note decaffeinated beverages (teas/coffees) still contain caffeine).  If you have caffeine within 12 hours prior, the test will need to be rescheduled.  Medication instructions: Do not take erectile dysfunction medications for 72 hours prior to test (sildenafil , tadalafil)    You may take your remaining medications with water.  NO perfume, cologne or lotion on chest or abdomen area.   Total time is 1 to 2 hours; you may want to bring reading material for the waiting time.   In preparation for your appointment, medication and supplies will be purchased.  Appointment availability is limited, so if you need to cancel or reschedule, please call the Radiology Department at [phone] [facility]) OR [phone] [facility]) 24 hours in advance to avoid a cancellation fee of $100.00  What to Expect When you Arrive:  Once you arrive and check in for your appointment, you will be taken to a preparation room within the Radiology Department.  A technologist or Nurse will obtain your medical history, verify that you are correctly prepped for the exam, and explain the procedure.  Afterwards, an IV will be started in your arm and electrodes will be placed on your skin for EKG monitoring during the stress portion of the exam. Then you will be escorted to the PET/CT scanner.  There, staff will get  you positioned on the scanner and obtain a blood pressure and EKG.  During the exam, you will continue to be connected to the EKG and blood pressure machines.  A small, safe amount of a radioactive tracer will be injected in your IV to obtain a series of pictures of your heart along with an injection of a stress agent.    After your Exam:  It is recommended that you eat a meal and drink a caffeinated beverage to counter act any effects of the stress agent.  Drink plenty of fluids for the remainder of the day and urinate frequently for the first couple of hours after the exam.  Your doctor will inform you of your test results within 7-10 business days.  For more information and frequently asked questions, please visit our website: [email or web address]  For questions about your test or how to prepare for your test, please call: Cardiac Imaging Nurse Navigators Office: [phone]    Follow-Up:  4-6 WEEKS WITH DR. PATWARDHAN OR AN EXTENDER

## 2023-11-10 NOTE — Progress Notes (Signed)
 Cardiology Office Note:  .   Date:  11/10/2023  ID:  Manuel Richardson, DOB 09-Jan-1956, MRN [id number] PCP: Valentin Skates, DO  [facility] Providers Cardiologist:  Newman Lawrence, MD PCP: Valentin Skates, DO  Chief Complaint  Patient presents with   Coronary calcification   Shortness of Breath           History of Present Illness: .    Manuel Richardson is a 68 y.o. male with hypertension, hyperlipidemia, prior history of A-fib, h/o left carotid to subclavian bypass, severe left subclavian stenosis, COPD, nicotine  dependence, h/o amaurosis fugax.   Patient was last seen by me in 07/2023.  Recently, he was seen by pulmonologist Dr. Tamea in 10/2023.  While he does have severe COPD, Dr. Tamea was concerned about his dyspnea being related to his CAD given calcification noted on CT scan in LAD.   Patient is here with his Sister Manuel Richardson today.  He has not had any change in his symptomatology since I last saw him in 07/2023.  He does have severe exertional dyspnea with walking to mailbox, but denies any chest pain, chest tightness symptoms.  Pulmonologist Dr. Tamea is going to start him on a new COPD treatment.  Prior to that, recommended follow-up with me for evaluation of CAD.  Vitals:   11/10/23 0836  BP: (!) 162/98  Pulse: 80  SpO2: 92%     ROS:  Review of Systems  Cardiovascular:  Positive for dyspnea on exertion. Negative for chest pain, leg swelling, palpitations and syncope.     Studies Reviewed: SABRA         Independently interpreted 07/2023: HbA1C 6.2% Cr 1.08 Na 133  05/2023: Chol 155, TG 98, HDL 51, LDL 86   EKG 11/10/2023: Normal sinus rhythm Possible Inferior infarct (cited on or before 30-Jul-2023) When compared with ECG of 30-Jul-2023 13:45, No significant change was found      Physical Exam:   Physical Exam Vitals and nursing note reviewed.  Constitutional:      General: He is not in acute distress. Neck:     Vascular: No JVD.   Cardiovascular:     Rate and Rhythm: Normal rate and regular rhythm.     Heart sounds: Normal heart sounds. No murmur heard. Pulmonary:     Effort: Pulmonary effort is normal.     Breath sounds: Normal breath sounds. No wheezing or rales.  Musculoskeletal:     Right lower leg: No edema.     Left lower leg: No edema.      VISIT DIAGNOSES: No diagnosis found.   ASSESSMENT AND PLAN: .    [name] is a 68 y.o. male with hypertension, hyperlipidemia, prior history of A-fib, h/o left carotid to subclavian bypass, severe left subclavian stenosis, COPD, nicotine  dependence, h/o amaurosis fugax.  Exertional dyspnea: Severe exertional dyspnea with no change in symptoms since last visit in 07/2023. He has severe COPD Gold stage III, which is the most likely reason for his exertional dyspnea. Nonetheless, known severe calcification in LAD also raises possibility of angina equivalent.   He is not able to walk on treadmill for long distances due to exertional dyspnea. Will obtain PET/CT stress test for objective evaluation of ischemia. Continue aspirin , statin. Added metoprolol  succinate 50 mg daily as an antianginal medication. Continue losartan , which she has not taken today.  Carotid/subclavian stenosis: Extensive atherosclerosis noted on CTA in 2022 with prior left carotid-subclavian bypass. CT angiogram head/neck showed occlusion of cervical, petrous,  cavernous segments of bilateral internal carotid arteries with reconstitution at the level of supraclinoid ICAs via robust PCOM bilaterally.  This seems to be unchanged compared to 04/2021.  I do not think subclavian arteries are adequately evaluated on this CT angiogram.  However, he does not have any severe symptoms related to any possibility of subclavian stenosis at this time. Continue medical management with aspirin , statin.  Hypertension: Uncontrolled. Added metoprolol  succinate 50 mg daily, continue losartan  50 mg daily.    H/o Afib: Reported episode after inhalation of albuterol  in 2022. He had no symptoms then and remains unclear if he had any recurrence since then. He wore Zio patch in 2022 through [facility] that did not show Afib. He is absolutely not in favor ir restarting Eliquis  or any other anticoagulation.  Unless he has any recurrent A-fib, we would hold off initiating anticoagulation at this time.   No orders of the defined types were placed in this encounter.    F/u in 4 to 6 weeks  Signed, Newman JINNY Lawrence, MD

## 2023-11-10 NOTE — Telephone Encounter (Signed)
 Received fax from [employer] Rx for Ohtuvayre  will be triaged to [facility]. IF patient expresses inability to afford medication he will be referred back to [facility] for patient assistance pre-screening  [facility] Phone: [phone] Patient ID: [id number]  Sherry Pennant, PharmD, MPH, BCPS, CPP Clinical Pharmacist (Rheumatology and Pulmonology)

## 2023-11-11 ENCOUNTER — Telehealth: Payer: Self-pay | Admitting: *Deleted

## 2023-11-11 NOTE — Telephone Encounter (Signed)
-----   Message from [place] C sent at 11/11/2023  9:17 AM EST ----- Regarding: RE: CARDIAC PET SCAN PER DR. PATWARDHAN--SCHEDULE WITHIN 4 WEEKS PLEASE Patient is scheduled at [facility] on 12/11/23 at 1040am. I spoke with sister who takes care of pt appts to schedule. ----- Message ----- From: Prentiss Camie HERO, RN Sent: 11/11/2023   8:32 AM EST To: Gale HERO Eng; Katelyn J Causey Subject: FW: CARDIAC PET SCAN PER DR. PATWARDHAN--SCH#  Please see timeline request below :) Thank you both Camie ORN ----- Message ----- From: Gladis Porter HERO, LPN Sent: 8/86/7974   9:02 AM EST To: Camie HERO Prentiss, RN; [facility] Subject: CARDIAC PET SCAN PER DR. PATWARDHAN--SCHEDUL#  -NM PET CT Cardiac perfusion multi (PFH102) ordered by Dr. Elmira -please pre-cert and schedule    DR. PATWARDHAN WOULD LIKE THIS SCHEDULED WITHIN 4 WEEK TIME FRAME--IF THIS CAN'T BE SCHEDULED WITHIN THIS TIME FRAME, PLEASE LET US  KNOW, SO WE CAN SWITCH THE ORDER TO A LEXISCAN  THEREAFTER.  Thanks, [employer]

## 2023-11-13 ENCOUNTER — Ambulatory Visit: Payer: Medicare HMO | Admitting: Pulmonary Disease

## 2023-11-20 ENCOUNTER — Telehealth: Payer: Self-pay | Admitting: Pulmonary Disease

## 2023-11-20 NOTE — Telephone Encounter (Signed)
Pt's sister DPR is asking if a request to get the new rx's tier reduced

## 2023-11-20 NOTE — Telephone Encounter (Signed)
I spoke with the patient's sister (DPR), she said the patient assistance program for [facility] told her the medication would be $1400 a month and they would not help until he had paid $2000 out of pocket. He can not pay that.  Pharmacy team can you check and see if this is correct?

## 2023-11-28 NOTE — Telephone Encounter (Signed)
Continue Breztri for now continue as needed albuterol.  He has upcoming appointment with me and we will discuss further then.

## 2023-11-28 NOTE — Telephone Encounter (Signed)
I have notified the patient's sister Cordelia Pen [facility]).  Nothing further needed.

## 2023-11-28 NOTE — Telephone Encounter (Signed)
This is correct for all patient unfortunately. Patient assistance program for [facility] will not approve patients until they have paid $2000 OOP.  He can either wait to see if he meets this with his other medical care or otherwise start paying towards this with [facility] unfortunately. There are many other patients in this same situation that are unable to start [facility] with hte new year.  This is a specialty medication and not eligible for tier reduction  Chesley Mires, PharmD, MPH, BCPS, CPP Clinical Pharmacist (Rheumatology and Pulmonology)

## 2023-11-28 NOTE — Telephone Encounter (Signed)
Patient is unable to get the [facility]. Is there anything else you would suggest?

## 2023-12-05 ENCOUNTER — Ambulatory Visit: Payer: Self-pay | Admitting: Pulmonary Disease

## 2023-12-09 ENCOUNTER — Encounter (HOSPITAL_COMMUNITY): Payer: Self-pay

## 2023-12-11 ENCOUNTER — Ambulatory Visit: Admission: RE | Admit: 2023-12-11 | Payer: HMO | Source: Ambulatory Visit

## 2023-12-17 ENCOUNTER — Ambulatory Visit: Payer: Self-pay | Admitting: Pulmonary Disease

## 2023-12-18 ENCOUNTER — Ambulatory Visit: Payer: HMO | Admitting: Cardiology

## 2023-12-18 ENCOUNTER — Ambulatory Visit: Admission: RE | Admit: 2023-12-18 | Payer: HMO | Source: Ambulatory Visit

## 2023-12-25 ENCOUNTER — Ambulatory Visit: Payer: Self-pay | Admitting: Pulmonary Disease

## 2023-12-31 ENCOUNTER — Telehealth (HOSPITAL_COMMUNITY): Payer: Self-pay | Admitting: *Deleted

## 2023-12-31 NOTE — Telephone Encounter (Signed)
 Reaching out to patient to offer assistance regarding upcoming cardiac imaging study; pt's sister answered phone and verbalizes understanding of appt date/time, parking situation and where to check in, pre-test NPO status, and verified current allergies; name and call back number provided for further questions should they arise  Larey Brick RN Navigator Cardiac Imaging [facility] [phone] office [phone] cell  Patient's sister is aware that the patient is to avoid caffeine 12 hours prior to his cardiac PET scan.

## 2024-01-01 ENCOUNTER — Ambulatory Visit
Admission: RE | Admit: 2024-01-01 | Discharge: 2024-01-01 | Disposition: A | Discharge status: Home / Self Care | Payer: HMO | Source: Ambulatory Visit | Attending: Cardiology | Admitting: Cardiology

## 2024-01-01 DIAGNOSIS — J449 Chronic obstructive pulmonary disease, unspecified: Secondary | ICD-10-CM | POA: Diagnosis not present

## 2024-01-01 DIAGNOSIS — I25118 Atherosclerotic heart disease of native coronary artery with other forms of angina pectoris: Secondary | ICD-10-CM

## 2024-01-01 DIAGNOSIS — J439 Emphysema, unspecified: Secondary | ICD-10-CM | POA: Insufficient documentation

## 2024-01-01 DIAGNOSIS — I739 Peripheral vascular disease, unspecified: Secondary | ICD-10-CM | POA: Diagnosis not present

## 2024-01-01 DIAGNOSIS — E663 Overweight: Secondary | ICD-10-CM | POA: Diagnosis not present

## 2024-01-01 DIAGNOSIS — Z87891 Personal history of nicotine dependence: Secondary | ICD-10-CM | POA: Diagnosis not present

## 2024-01-01 DIAGNOSIS — E785 Hyperlipidemia, unspecified: Secondary | ICD-10-CM | POA: Diagnosis not present

## 2024-01-01 DIAGNOSIS — R0609 Other forms of dyspnea: Secondary | ICD-10-CM

## 2024-01-01 DIAGNOSIS — I10 Essential (primary) hypertension: Secondary | ICD-10-CM | POA: Diagnosis not present

## 2024-01-01 DIAGNOSIS — I700 Atherosclerosis of aorta: Secondary | ICD-10-CM | POA: Insufficient documentation

## 2024-01-01 DIAGNOSIS — I2510 Atherosclerotic heart disease of native coronary artery without angina pectoris: Secondary | ICD-10-CM | POA: Insufficient documentation

## 2024-01-01 LAB — NM PET CT CARDIAC PERFUSION MULTI W/ABSOLUTE BLOODFLOW
LV dias vol: 97 mL (ref 62–150)
MBFR: 1.92
Nuc Rest EF: 33 %
Nuc Stress EF: 36 %
Peak HR: 98 {beats}/min
Rest HR: 73 {beats}/min
Rest MBF: 1.11 ml/g/min
Rest Nuclear Isotope Dose: 19.7 mCi
Rest perfusion cavity size (mL): 97 mL
SRS: 0
SSS: 3
ST Depression (mm): 0 mm
Stress MBF: 2.13 ml/g/min
Stress Nuclear Isotope Dose: 19.7 mCi
Stress perfusion cavity size (mL): 101 mL
TID: 0.98

## 2024-01-01 MED ORDER — RUBIDIUM RB82 GENERATOR (RUBYFILL)
25.0000 | PACK | Freq: Once | INTRAVENOUS | Status: AC
  Administered 2024-01-01: 19.72 via INTRAVENOUS

## 2024-01-01 MED ORDER — REGADENOSON 0.4 MG/5ML IV SOLN
0.4000 mg | Freq: Once | INTRAVENOUS | Status: AC
  Administered 2024-01-01: 0.4 mg via INTRAVENOUS
  Filled 2024-01-01: qty 5

## 2024-01-01 MED ORDER — RUBIDIUM RB82 GENERATOR (RUBYFILL)
25.0000 | PACK | Freq: Once | INTRAVENOUS | Status: AC
  Administered 2024-01-01: 19.73 via INTRAVENOUS

## 2024-01-01 MED ORDER — REGADENOSON 0.4 MG/5ML IV SOLN
INTRAVENOUS | Status: AC
  Filled 2024-01-01: qty 5

## 2024-01-01 NOTE — Progress Notes (Signed)
 Patient presents for a cardiac PET stress test and tolerated procedure without incident. Patient maintained acceptable vital signs throughout the test and was offered caffeine after test.  Patient ambulated out of department with a steady gait.

## 2024-01-02 NOTE — Progress Notes (Signed)
 Recommend coronary angiography and possible intervention. Happy to see him in office to discuss further before cath.  Thanks MJP

## 2024-01-05 ENCOUNTER — Ambulatory Visit: Attending: Cardiology | Admitting: Cardiology

## 2024-01-05 ENCOUNTER — Telehealth: Payer: Self-pay | Admitting: Pulmonary Disease

## 2024-01-05 ENCOUNTER — Encounter: Payer: Self-pay | Admitting: Cardiology

## 2024-01-05 ENCOUNTER — Other Ambulatory Visit: Payer: Self-pay

## 2024-01-05 VITALS — BP 150/78 | HR 80 | Ht 69.0 in | Wt 169.0 lb

## 2024-01-05 DIAGNOSIS — I10 Essential (primary) hypertension: Secondary | ICD-10-CM

## 2024-01-05 DIAGNOSIS — I739 Peripheral vascular disease, unspecified: Secondary | ICD-10-CM

## 2024-01-05 DIAGNOSIS — Z01818 Encounter for other preprocedural examination: Secondary | ICD-10-CM

## 2024-01-05 DIAGNOSIS — E782 Mixed hyperlipidemia: Secondary | ICD-10-CM | POA: Diagnosis not present

## 2024-01-05 DIAGNOSIS — I25118 Atherosclerotic heart disease of native coronary artery with other forms of angina pectoris: Secondary | ICD-10-CM

## 2024-01-05 MED ORDER — ASPIRIN 81 MG PO TBEC: 81.0000 mg | DELAYED_RELEASE_TABLET | Freq: Every day | ORAL | Status: DC

## 2024-01-05 MED ORDER — AMLODIPINE BESYLATE 5 MG PO TABS: 5.0000 mg | ORAL_TABLET | Freq: Every day | ORAL | 3 refills | Status: AC

## 2024-01-05 NOTE — Telephone Encounter (Signed)
 Pt's sister calling. States PT is having a Heart Cath on the 21st. Should they wait to see Dr. Reece Agar AFTER this procedure. Please all to advise @ [phone]

## 2024-01-05 NOTE — Progress Notes (Signed)
 Cardiology Office Note:  .   Date:  01/05/2024  ID:  ANDER WAMSER, DOB 1956/04/24, MRN [id number] PCP: Charlane Ferretti, DO  [facility] Providers Cardiologist:  Truett Mainland, MD PCP: Charlane Ferretti, DO  Chief Complaint  Patient presents with   Coronary Artery Disease      History of Present Illness: .    LEVI KLAIBER is a 68 y.o. 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   Patient continues to have exertional dyspnea symptoms without any chest pain.  Discussed the recent PET/CT cardiac stress test this with the patient, details below.  Blood pressure is elevated today.  Vitals:   01/05/24 1440  BP: (!) 150/78  Pulse: 80  SpO2: 94%      ROS:  Review of Systems  Cardiovascular:  Positive for dyspnea on exertion. Negative for chest pain, leg swelling, palpitations and syncope.     Studies Reviewed: Marland Kitchen       EKG 01/05/2024: Normal sinus rhythm with sinus arrhythmia Cannot rule out Inferior infarct (cited on or before 30-Jul-2023) When compared with ECG of 10-Nov-2023 08:39, No significant change was found    PET/CT cardiac stress test 01/01/2024:   Medium, moderate reversible defect in the basal to mid inferior segments and basal inferolateral segment consistent with ischemia. LVEF is moderately reduced with increase at stress (EF 33%->36%). No TID. MBFR is mildly abnormal (1.92). Overall, these findings are high risk due to ischemia, abnormal MBFR, and resting LVEF <35%.   LV perfusion is abnormal. There is evidence of ischemia. There is no evidence of infarction. Defect 1: There is a medium defect with moderate reduction in uptake present in the mid to basal inferior and inferolateral location(s) that is reversible. There is abnormal wall motion in the defect area. Consistent with ischemia.   Rest left ventricular  function is abnormal. Rest global function is moderately reduced. There were no regional wall motion abnormalities. Rest EF: 33%. Stress left ventricular function is abnormal. Stress global function is moderately reduced. There were no regional wall abnormalities. Stress EF: 36%. End diastolic cavity size is normal.   Myocardial blood flow was computed to be 1.103ml/g/min at rest and 2.2ml/g/min at stress. Global myocardial blood flow reserve was 1.92 and was mildly abnormal.   Coronary calcium was present on the attenuation correction CT images. Severe coronary calcifications were present. Coronary calcifications were present in the left anterior descending artery and right coronary artery distribution(s).   Findings are consistent with ischemia. The study is high risk.   Independently interpreted 07/2023: HbA1C 6.2% Cr 1.08 Na 133  05/2023: Chol 155, TG 98, HDL 51, LDL 86   EKG 11/10/2023: Normal sinus rhythm Possible Inferior infarct (cited on or before 30-Jul-2023) When compared with ECG of 30-Jul-2023 13:45, No significant change was found      Physical Exam:   Physical Exam Vitals and nursing note reviewed.  Constitutional:      General: He is not in acute distress. Neck:     Vascular: No JVD.  Cardiovascular:     Rate and Rhythm: Normal rate and regular rhythm.     Pulses:          Dorsalis pedis pulses are 1+ on the right side and 1+ on the left side.       Posterior tibial pulses are 1+ on the right side and 0 on the left  side.     Heart sounds: Normal heart sounds. No murmur heard. Pulmonary:     Effort: Pulmonary effort is normal.     Breath sounds: Normal breath sounds. No wheezing or rales.  Musculoskeletal:     Right lower leg: No edema.     Left lower leg: No edema.      VISIT DIAGNOSES:   ICD-10-CM   1. Primary hypertension  I10 EKG 12-Lead    Basic metabolic panel    CBC    CANCELED: CBC    CANCELED: Basic metabolic panel    2. Coronary artery  disease involving native coronary artery of native heart with other form of angina pectoris (HCC)  I25.118     3. PAD (peripheral artery disease) (HCC)  I73.9 EKG 12-Lead    VAS [place] LOWER EXTREMITY ARTERIAL DUPLEX    Basic metabolic panel    CBC    Lipid panel    CANCELED: CBC    CANCELED: Basic metabolic panel    CANCELED: Lipid panel    4. Essential hypertension, benign  I10 EKG 12-Lead    Basic metabolic panel    CBC    CANCELED: CBC    CANCELED: Basic metabolic panel    5. Mixed hyperlipidemia  E78.2 EKG 12-Lead    Lipid panel    CANCELED: Lipid panel    6. Pre-op evaluation  Z01.818 Basic metabolic panel    CBC    CANCELED: CBC    CANCELED: Basic metabolic panel       ASSESSMENT AND PLAN: .    ISMEAL HEIDER is a 68 y.o. 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  Exertional dyspnea: In addition to COPD, likely angina equivalent. Significant ischemia noted on recent PET/CT stress test, in addition to coronary calcification in LAD and RCA. Continue current medical therapy including aspirin, statin, metoprolol succinate 50 mg daily. Added amlodipine 5 mg daily. In addition, recommend coronary angiography and possible intervention for symptom improvement. I have reviewed the risks, indications, and alternatives to cardiac catheterization, possible angioplasty, and stenting with the patient. Risks include but are not limited to bleeding, infection, vascular injury, stroke, myocardial infection, arrhythmia, kidney injury, radiation-related injury in the case of prolonged fluoroscopy use, emergency cardiac surgery, and death. The patient understands the risks of serious complication is 1-2 in 1000 with diagnostic cardiac cath and 1-2% or less with angioplasty/stenting.  If both PCI and CABG considered equal, patient would prefer PCI in  the setting of his severe COPD.  Carotid/subclavian stenosis: Extensive atherosclerosis noted on CTA in 2022 with prior left carotid-subclavian bypass. CT angiogram head/neck showed occlusion of cervical, petrous, cavernous segments of bilateral internal carotid arteries with reconstitution at the level of supraclinoid ICAs via robust PCOM bilaterally.  This seems to be unchanged compared to 04/2021.  I do not think subclavian arteries are adequately evaluated on this CT angiogram.  However, he does not have any severe symptoms related to any possibility of subclavian stenosis at this time. Continue medical management with aspirin, statin.  PAD: Lower extreme claudication, right greater than sign left, without critical limb ischemia.  In addition to continuing current medical therapy, recommend lower extremity arterial duplex.  Hypertension: Uncontrolled. Added amlodipine 5 mg daily.  H/o Afib: Reported episode after inhalation of albuterol in 2022. He had no symptoms then and remains unclear if he had any recurrence since then.  He wore Zio patch in 2022 through [facility] that did not show Afib. He is absolutely not in favor ir restarting Eliquis or any other anticoagulation.  Unless he has any recurrent A-fib, we would hold off initiating anticoagulation at this time.  Informed Consent   Shared Decision Making/Informed Consent The risks [stroke (1 in 1000), death (1 in 1000), kidney failure [usually temporary] (1 in 500), bleeding (1 in 200), allergic reaction [possibly serious] (1 in 200)], benefits (diagnostic support and management of coronary artery disease) and alternatives of a cardiac catheterization were discussed in detail with Mr. Faythe Casa and he is willing to proceed.      Meds ordered this encounter  Medications   amLODipine (NORVASC) 5 MG tablet    Sig: Take 1 tablet (5 mg total) by mouth daily.    Dispense:  90 tablet    Refill:  3   aspirin EC 81 MG tablet     Sig: Take 1 tablet (81 mg total) by mouth daily. Swallow whole.     F/u in 4 to 6 weeks  Signed, Elder Negus, MD

## 2024-01-05 NOTE — Telephone Encounter (Signed)
 Yes it is reasonable to wait till after the heart cath.

## 2024-01-05 NOTE — Patient Instructions (Addendum)
 Medication:  START Amlodipine 5 mg take one tablet by mouth daily  START Aspirin 81 mg take one tablet by mouth daily   Lab Work: CBC BMP Lipid Panel  If you have labs (blood work) drawn today and your tests are completely normal, you will receive your results only by: MyChart Message (if you have MyChart) OR A paper copy in the mail If you have any lab test that is abnormal or we need to change your treatment, we will call you to review the results.   Testing/Procedures:  BILATERAL LOWER EXTREMITY DUPLEX   Your physician has requested that you have a lower or upper extremity arterial duplex. This test is an ultrasound of the arteries in the legs or arms. It looks at arterial blood flow in the legs and arms. Allow one hour for Lower and Upper Arterial scans. There are no restrictions or special instructions.  Please note: We ask at that you not bring children with you during ultrasound (echo/ vascular) testing. Due to room size and safety concerns, children are not allowed in the ultrasound rooms during exams. Our front office staff cannot provide observation of children in our lobby area while testing is being conducted. An adult accompanying a patient to their appointment will only be allowed in the ultrasound room at the discretion of the ultrasound technician under special circumstances. We apologize for any inconvenience.   LEFT HEART CATH    Follow-Up: At [facility], you and your health needs are our priority.  As part of our continuing mission to provide you with exceptional heart care, we have created designated Provider Care Teams.  These Care Teams include your primary Cardiologist (physician) and Advanced Practice Providers (APPs -  Physician Assistants and Nurse Practitioners) who all work together to provide you with the care you need, when you need it.   Your next appointment:   Follow up 2 weeks after heart cath on 01/16/24  Provider:   Elder Negus,  MD     Other Instructions   You are scheduled for a Cardiac Catheterization on Friday, March 21 with Dr.  Truett Mainland .  1. Please arrive at the [facility] (Main Entrance A) at [facility]: [address] [place], [place] [address] at 7:00 AM (This time is 2 hour(s) before your procedure to ensure your preparation).   Free valet parking service is available. You will check in at ADMITTING. The support person will be asked to wait in the waiting room.  It is OK to have someone drop you off and come back when you are ready to be discharged.    Special note: Every effort is made to have your procedure done on time. Please understand that emergencies sometimes delay scheduled procedures.  2. Diet: Do not eat solid foods after midnight.  The patient may have clear liquids until 5am upon the day of the procedure.  3. Labs: You will need to have blood drawn at [facility].   4. Medication instructions in preparation for your procedure:   Contrast Allergy: No   On the morning of your procedure, take your Aspirin 81 mg and any morning medicines NOT listed above.  You may use sips of water.  5. Plan to go home the same day, you will only stay overnight if medically necessary. 6. Bring a current list of your medications and current insurance cards. 7. You MUST have a responsible person to drive you home. 8. Someone MUST be with you  the first 24 hours after you arrive home or your discharge will be delayed. 9. Please wear clothes that are easy to get on and off and wear slip-on shoes.  Thank you for allowing [place] to care for you!   -- [place] [facility]       1st Floor: - Lobby - Registration  - Pharmacy  - Lab - Cafe  2nd Floor: - PV Lab - Diagnostic Testing (echo, CT, nuclear med)  3rd Floor: - Vacant  4th Floor: - TCTS (cardiothoracic surgery) - AFib Clinic - Structural Heart Clinic - Vascular Surgery  - Vascular Ultrasound  5th  Floor: - [facility]Care Cardiology (general and EP) - Clinical Pharmacy for coumadin, hypertension, lipid, weight-loss medications, and med management appointments    Valet parking services will be available as well.

## 2024-01-06 LAB — CBC
Hematocrit: 44.5 % (ref 37.5–51.0)
Hemoglobin: 15.2 g/dL (ref 13.0–17.7)
MCH: 31.5 pg (ref 26.6–33.0)
MCHC: 34.2 g/dL (ref 31.5–35.7)
MCV: 92 fL (ref 79–97)
Platelets: 181 10*3/uL (ref 150–450)
RBC: 4.82 x10E6/uL (ref 4.14–5.80)
RDW: 13.1 % (ref 11.6–15.4)
WBC: 6.3 10*3/uL (ref 3.4–10.8)

## 2024-01-06 LAB — LIPID PANEL
Chol/HDL Ratio: 3.6 ratio (ref 0.0–5.0)
Cholesterol, Total: 178 mg/dL (ref 100–199)
HDL: 49 mg/dL (ref >39)
LDL Chol Calc (NIH): 89 mg/dL (ref 0–99)
Triglycerides: 237 mg/dL — ABNORMAL HIGH (ref 0–149)
VLDL Cholesterol Cal: 40 mg/dL (ref 5–40)

## 2024-01-06 LAB — BASIC METABOLIC PANEL
BUN/Creatinine Ratio: 14 (ref 10–24)
BUN: 17 mg/dL (ref 8–27)
CO2: 23 mmol/L (ref 20–29)
Calcium: 9.6 mg/dL (ref 8.6–10.2)
Chloride: 104 mmol/L (ref 96–106)
Creatinine, Ser: 1.19 mg/dL (ref 0.76–1.27)
Glucose: 90 mg/dL (ref 70–99)
Potassium: 4.8 mmol/L (ref 3.5–5.2)
Sodium: 141 mmol/L (ref 134–144)
eGFR: 67 mL/min/{1.73_m2} (ref >59)

## 2024-01-06 NOTE — Telephone Encounter (Signed)
 I spoke with the patient's sister and I have rescheduled his appt to 4/10 at 10:15am.  Nothing further needed.

## 2024-01-07 NOTE — Progress Notes (Signed)
 Cholesterol elevated.  Recommend adding Zetia 10 mg daily. Will check lipoprotein a at the time of cath.  If elevated, will refer to lipid clinic for consideration of addition of injectable agents. If lipoprotein a is normal, I would then increase Crestor to 40 mg daily.  Thanks MJP

## 2024-01-08 ENCOUNTER — Telehealth: Payer: Self-pay | Admitting: Cardiology

## 2024-01-08 MED ORDER — EZETIMIBE 10 MG PO TABS: 10.0000 mg | ORAL_TABLET | Freq: Every day | ORAL | 3 refills | Status: AC

## 2024-01-08 NOTE — Telephone Encounter (Signed)
 The patients sister Cordelia Pen (on DPR)has been notified of the result and verbalized understanding.  All questions (if any) were answered.   Cordelia Pen is aware that we will start the pt on zetia 10 mg po daily.  Confirmed the pharmacy of choice with Cordelia Pen.  Cordelia Pen verbalized understanding and agrees with this plan.  Cordelia Pen will update the pt on this change.

## 2024-01-08 NOTE — Telephone Encounter (Signed)
-----   Message from Nurse Nehemiah Settle S sent at 01/08/2024  8:52 AM EDT -----  ----- Message ----- From: Elder Negus, MD Sent: 01/07/2024   8:29 AM EDT To: [facility] Triage  Cholesterol elevated.  Recommend adding Zetia 10 mg daily. Will check lipoprotein a at the time of cath.  If elevated, will refer to lipid clinic for consideration of addition of injectable agents. If lipoprotein a is normal, I would then increase Crestor to 40 mg daily.  Thanks MJP

## 2024-01-08 NOTE — Telephone Encounter (Signed)
 Patient's sister was returning call for results. Please advise

## 2024-01-13 ENCOUNTER — Ambulatory Visit: Payer: Self-pay | Admitting: Pulmonary Disease

## 2024-01-15 ENCOUNTER — Telehealth: Payer: Self-pay | Admitting: *Deleted

## 2024-01-15 DIAGNOSIS — J439 Emphysema, unspecified: Secondary | ICD-10-CM | POA: Diagnosis not present

## 2024-01-15 DIAGNOSIS — J449 Chronic obstructive pulmonary disease, unspecified: Secondary | ICD-10-CM | POA: Diagnosis not present

## 2024-01-15 NOTE — Telephone Encounter (Signed)
 Cardiac Catheterization scheduled at [facility] for: Friday January 16, 2024 9 AM Arrival time [facility] Main Entrance A at: 7 AM  Nothing to eat after midnight prior to procedure, clear liquids until 5 AM day of procedure.  Medication instructions: -Usual morning medications can be taken with sips of water including aspirin 81 mg.  Plan to go home the same day, you will only stay overnight if medically necessary.  You must have responsible adult to drive you home.  Someone must be with you the first 24 hours after you arrive home.  Reviewed procedure instructions with patient.

## 2024-01-16 ENCOUNTER — Other Ambulatory Visit: Payer: Self-pay

## 2024-01-16 ENCOUNTER — Ambulatory Visit (HOSPITAL_COMMUNITY)
Admission: RE | Admit: 2024-01-16 | Discharge: 2024-01-16 | Disposition: A | Discharge status: Home / Self Care | Attending: Cardiology | Admitting: Cardiology

## 2024-01-16 ENCOUNTER — Encounter (HOSPITAL_COMMUNITY)
Admission: RE | Disposition: A | Discharge status: Home / Self Care | Payer: Self-pay | Source: Home / Self Care | Attending: Cardiology

## 2024-01-16 ENCOUNTER — Telehealth: Payer: Self-pay | Admitting: Cardiology

## 2024-01-16 DIAGNOSIS — R9439 Abnormal result of other cardiovascular function study: Secondary | ICD-10-CM | POA: Diagnosis present

## 2024-01-16 DIAGNOSIS — I739 Peripheral vascular disease, unspecified: Secondary | ICD-10-CM | POA: Insufficient documentation

## 2024-01-16 DIAGNOSIS — I6523 Occlusion and stenosis of bilateral carotid arteries: Secondary | ICD-10-CM | POA: Diagnosis not present

## 2024-01-16 DIAGNOSIS — Z79899 Other long term (current) drug therapy: Secondary | ICD-10-CM | POA: Insufficient documentation

## 2024-01-16 DIAGNOSIS — E785 Hyperlipidemia, unspecified: Secondary | ICD-10-CM | POA: Insufficient documentation

## 2024-01-16 DIAGNOSIS — I25118 Atherosclerotic heart disease of native coronary artery with other forms of angina pectoris: Secondary | ICD-10-CM | POA: Diagnosis not present

## 2024-01-16 DIAGNOSIS — Z7982 Long term (current) use of aspirin: Secondary | ICD-10-CM | POA: Diagnosis not present

## 2024-01-16 DIAGNOSIS — I10 Essential (primary) hypertension: Secondary | ICD-10-CM | POA: Diagnosis not present

## 2024-01-16 DIAGNOSIS — I2582 Chronic total occlusion of coronary artery: Secondary | ICD-10-CM | POA: Diagnosis not present

## 2024-01-16 DIAGNOSIS — I2584 Coronary atherosclerosis due to calcified coronary lesion: Secondary | ICD-10-CM | POA: Insufficient documentation

## 2024-01-16 DIAGNOSIS — Z87891 Personal history of nicotine dependence: Secondary | ICD-10-CM | POA: Diagnosis not present

## 2024-01-16 DIAGNOSIS — J449 Chronic obstructive pulmonary disease, unspecified: Secondary | ICD-10-CM | POA: Insufficient documentation

## 2024-01-16 DIAGNOSIS — I4891 Unspecified atrial fibrillation: Secondary | ICD-10-CM | POA: Diagnosis not present

## 2024-01-16 HISTORY — PX: LEFT HEART CATH AND CORONARY ANGIOGRAPHY: CATH118249

## 2024-01-16 LAB — POCT ACTIVATED CLOTTING TIME: Activated Clotting Time: 279 s

## 2024-01-16 SURGERY — LEFT HEART CATH AND CORONARY ANGIOGRAPHY
Anesthesia: LOCAL

## 2024-01-16 MED ORDER — VERAPAMIL HCL 2.5 MG/ML IV SOLN
INTRAVENOUS | Status: DC | PRN
  Administered 2024-01-16: 10 mL via INTRA_ARTERIAL

## 2024-01-16 MED ORDER — VERAPAMIL HCL 2.5 MG/ML IV SOLN
INTRAVENOUS | Status: AC
  Filled 2024-01-16: qty 2

## 2024-01-16 MED ORDER — SODIUM CHLORIDE 0.9% FLUSH: 3.0000 mL | INTRAVENOUS | Status: DC | PRN

## 2024-01-16 MED ORDER — LIDOCAINE HCL (PF) 1 % IJ SOLN
INTRAMUSCULAR | Status: DC | PRN
  Administered 2024-01-16: 2 mL

## 2024-01-16 MED ORDER — ACETAMINOPHEN 325 MG PO TABS: 650.0000 mg | ORAL_TABLET | ORAL | Status: DC | PRN

## 2024-01-16 MED ORDER — LIDOCAINE HCL (PF) 1 % IJ SOLN
INTRAMUSCULAR | Status: AC
  Filled 2024-01-16: qty 30

## 2024-01-16 MED ORDER — SODIUM CHLORIDE 0.9 % IV SOLN: 250.0000 mL | INTRAVENOUS | Status: DC | PRN

## 2024-01-16 MED ORDER — HEPARIN SODIUM (PORCINE) 1000 UNIT/ML IJ SOLN
INTRAMUSCULAR | Status: AC
  Filled 2024-01-16: qty 10

## 2024-01-16 MED ORDER — MIDAZOLAM HCL 2 MG/2ML IJ SOLN
INTRAMUSCULAR | Status: DC | PRN
  Administered 2024-01-16: 1 mg via INTRAVENOUS

## 2024-01-16 MED ORDER — LABETALOL HCL 5 MG/ML IV SOLN: 10.0000 mg | INTRAVENOUS | Status: DC | PRN

## 2024-01-16 MED ORDER — SODIUM CHLORIDE 0.9 % IV SOLN: INTRAVENOUS | Status: AC

## 2024-01-16 MED ORDER — SODIUM CHLORIDE 0.9% FLUSH: 3.0000 mL | Freq: Two times a day (BID) | INTRAVENOUS | Status: DC

## 2024-01-16 MED ORDER — FENTANYL CITRATE (PF) 100 MCG/2ML IJ SOLN
INTRAMUSCULAR | Status: AC
  Filled 2024-01-16: qty 2

## 2024-01-16 MED ORDER — SODIUM CHLORIDE 0.9 % WEIGHT BASED INFUSION: 1.0000 mL/kg/h | INTRAVENOUS | Status: DC

## 2024-01-16 MED ORDER — HEPARIN (PORCINE) IN NACL 1000-0.9 UT/500ML-% IV SOLN
INTRAVENOUS | Status: DC | PRN
  Administered 2024-01-16: 1000 mL

## 2024-01-16 MED ORDER — IOHEXOL 350 MG/ML SOLN
INTRAVENOUS | Status: DC | PRN
  Administered 2024-01-16: 60 mL

## 2024-01-16 MED ORDER — SODIUM CHLORIDE 0.9 % WEIGHT BASED INFUSION: 3.0000 mL/kg/h | INTRAVENOUS | Status: AC

## 2024-01-16 MED ORDER — HYDRALAZINE HCL 20 MG/ML IJ SOLN: 10.0000 mg | INTRAMUSCULAR | Status: DC | PRN

## 2024-01-16 MED ORDER — ONDANSETRON HCL 4 MG/2ML IJ SOLN: 4.0000 mg | Freq: Four times a day (QID) | INTRAMUSCULAR | Status: DC | PRN

## 2024-01-16 MED ORDER — MIDAZOLAM HCL 2 MG/2ML IJ SOLN
INTRAMUSCULAR | Status: AC
  Filled 2024-01-16: qty 2

## 2024-01-16 MED ORDER — FENTANYL CITRATE (PF) 100 MCG/2ML IJ SOLN
INTRAMUSCULAR | Status: DC | PRN
  Administered 2024-01-16: 25 ug via INTRAVENOUS

## 2024-01-16 MED ORDER — HEPARIN SODIUM (PORCINE) 1000 UNIT/ML IJ SOLN
INTRAMUSCULAR | Status: DC | PRN
  Administered 2024-01-16 (×2): 4000 [IU] via INTRAVENOUS

## 2024-01-16 MED ORDER — ASPIRIN 81 MG PO CHEW: 81.0000 mg | CHEWABLE_TABLET | ORAL | Status: DC

## 2024-01-16 SURGICAL SUPPLY — 19 items
CATH INFINITI 5 FR JL3.5 (CATHETERS) IMPLANT
CATH INFINITI AMBI 5FR TG (CATHETERS) IMPLANT
CATH LAUNCHER 6FR AL1 (CATHETERS) IMPLANT
CATH TELEPORT (CATHETERS) IMPLANT
CATH TELESCOPE 6F GEC (CATHETERS) IMPLANT
CATH VISTA GUIDE 6FR JR4 ECOPK (CATHETERS) IMPLANT
CATHETER LAUNCHER 6FR AL1 (CATHETERS) ×1 IMPLANT
DEVICE RAD COMP TR BAND LRG (VASCULAR PRODUCTS) IMPLANT
GLIDESHEATH SLEND A-KIT 6F 22G (SHEATH) IMPLANT
GLIDESHEATH SLEND SS 6F .021 (SHEATH) IMPLANT
GUIDEWIRE INQWIRE 1.5J.035X260 (WIRE) IMPLANT
INQWIRE 1.5J .035X260CM (WIRE) ×1 IMPLANT
KIT ENCORE 26 ADVANTAGE (KITS) IMPLANT
KIT HEMO VALVE WATCHDOG (MISCELLANEOUS) IMPLANT
PACK CARDIAC CATHETERIZATION (CUSTOM PROCEDURE TRAY) ×2 IMPLANT
SET ATX-X65L (MISCELLANEOUS) IMPLANT
SHEATH PROBE COVER 6X72 (BAG) IMPLANT
WIRE ASAHI PROWATER 180CM (WIRE) IMPLANT
WIRE HI TORQ BMW 190CM (WIRE) IMPLANT

## 2024-01-16 NOTE — Discharge Instructions (Addendum)
 PCI is scheduled for April 3rd at 7:30. Please arrive at 0530. Nothing to eat or drink. Ok to take medications morning of procedure.     Radial Site Care The following information offers guidance on how to care for yourself after your procedure. Your health care provider may also give you more specific instructions. If you have problems or questions, contact your health care provider. What can I expect after the procedure? After the procedure, it is common to have bruising and tenderness in the incision area. Follow these instructions at home: Incision site care  Follow instructions from your health care provider about how to take care of your incision site. Make sure you: Wash your hands with soap and water for at least 20 seconds before and after you change your bandage (dressing). If soap and water are not available, use hand sanitizer. Remove your dressing in 24 hours. Leave stitches (sutures), skin glue, or adhesive strips in place. These skin closures may need to stay in place for 2 weeks or longer. If adhesive strip edges start to loosen and curl up, you may trim the loose edges. Do not remove adhesive strips completely unless your health care provider tells you to do that. Do not take baths, swim, or use a hot tub for at least 1 week. You may shower 24 hours after the procedure or as told by your health care provider. Remove the dressing and gently wash the incision area with plain soap and water. Pat the area dry with a clean towel. Do not rub the site. That could cause bleeding. Do not apply powder or lotion to the site. Check your incision site every day for signs of infection. Check for: Redness, swelling, or pain. Fluid or blood. Warmth. Pus or a bad smell. Activity For 24 hours after the procedure, or as directed by your health care provider: Do not flex or bend the affected arm. Do not push or pull heavy objects with the affected arm. Do not operate machinery or power  tools. Do not drive. You should not drive yourself home from the hospital or clinic if you go home during that time period. You may drive 24 hours after the procedure unless your health care provider tells you not to. Do not lift anything that is heavier than 10 lb (4.5 kg), or the limit that you are told, until your health care provider says that it is safe. Return to your normal activities as told by your health care provider. Ask your health care provider what activities are safe for you and when you can return to work. If you were given a sedative during the procedure, it can affect you for several hours. Do not drive or operate machinery until your health care provider says that it is safe. General instructions Take over-the-counter and prescription medicines only as told by your health care provider. If you will be going home right after the procedure, plan to have a responsible adult care for you for the time you are told. This is important. Keep all follow-up visits. This is important. Contact a health care provider if: You have a fever or chills. You have any of these signs of infection at your incision site: Redness, swelling, or pain. Fluid or blood. Warmth. Pus or a bad smell. Get help right away if: The incision area swells very fast. The incision area is bleeding, and the bleeding does not stop when you hold steady pressure on the area. Your arm or hand becomes pale,  cool, tingly, or numb. These symptoms may represent a serious problem that is an emergency. Do not wait to see if the symptoms will go away. Get medical help right away. Call your local emergency services (911 in the [place] Do not drive yourself to the hospital. Summary After the procedure, it is common to have bruising and tenderness at the incision site. Follow instructions from your health care provider about how to take care of your radial site incision. Check the incision every day for signs of infection. Do  not lift anything that is heavier than 10 lb (4.5 kg), or the limit that you are told, until your health care provider says that it is safe. Get help right away if the incision area swells very fast, you have bleeding at the incision site that will not stop, or your arm or hand becomes pale, cool, or numb. This information is not intended to replace advice given to you by your health care provider. Make sure you discuss any questions you have with your health care provider. Document Revised: 12/03/2020 Document Reviewed: 12/03/2020 Elsevier Patient Education  2024 [employer].

## 2024-01-16 NOTE — Interval H&P Note (Signed)
 History and Physical Interval Note:  01/16/2024 9:28 AM  Manuel Richardson  has presented today for surgery, with the diagnosis of Coronary artery Diease.  The various methods of treatment have been discussed with the patient and family. After consideration of risks, benefits and other options for treatment, the patient has consented to  Procedure(s): LEFT HEART CATH AND CORONARY ANGIOGRAPHY (N/A) as a surgical intervention.  The patient's history has been reviewed, patient examined, no change in status, stable for surgery.  I have reviewed the patient's chart and labs.  Questions were answered to the patient's satisfaction.     Joelee Snoke J Sheleen Conchas

## 2024-01-16 NOTE — Telephone Encounter (Signed)
 Spoke with Manuel Richardson per DPR and she wanted to know if   Dr. Rosemary Holms wanted him to keep his appointment for Monday for a doppler. Patient is still at hospital and have not been discharged yet. Being that he is at the hospital seeing provider informed patient to ask the nurse while they are at the hospital.

## 2024-01-16 NOTE — H&P (Addendum)
 01/05/2024 copied for documentation   Cardiology Office Note:  .   Date:  01/16/2024  ID:  Manuel Richardson, DOB 10/05/1956, MRN [id number] PCP: Charlane Ferretti, DO  [facility] Providers Cardiologist:  Truett Mainland, MD PCP: Charlane Ferretti, DO  C/C: Chest pain    History of Present Illness: .    Manuel Richardson is a 69 y.o. 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   Patient continues to have exertional dyspnea symptoms without any chest pain.  Discussed the recent PET/CT cardiac stress test this with the patient, details below.  Blood pressure is elevated today.  Vitals:   01/16/24 0738 01/16/24 0924  BP: (!) 146/60   Pulse: (!) 59   Resp: 18   Temp: 98.3 F (36.8 C)   SpO2: 94% 93%      ROS:  Review of Systems  Cardiovascular:  Positive for dyspnea on exertion. Negative for chest pain, leg swelling, palpitations and syncope.     Studies Reviewed: Marland Kitchen       EKG 01/05/2024: Normal sinus rhythm with sinus arrhythmia Cannot rule out Inferior infarct (cited on or before 30-Jul-2023) When compared with ECG of 10-Nov-2023 08:39, No significant change was found    PET/CT cardiac stress test 01/01/2024:   Medium, moderate reversible defect in the basal to mid inferior segments and basal inferolateral segment consistent with ischemia. LVEF is moderately reduced with increase at stress (EF 33%->36%). No TID. MBFR is mildly abnormal (1.92). Overall, these findings are high risk due to ischemia, abnormal MBFR, and resting LVEF <35%.   LV perfusion is abnormal. There is evidence of ischemia. There is no evidence of infarction. Defect 1: There is a medium defect with moderate reduction in uptake present in the mid to basal inferior and inferolateral location(s) that is reversible. There is abnormal wall motion in the defect area.  Consistent with ischemia.   Rest left ventricular function is abnormal. Rest global function is moderately reduced. There were no regional wall motion abnormalities. Rest EF: 33%. Stress left ventricular function is abnormal. Stress global function is moderately reduced. There were no regional wall abnormalities. Stress EF: 36%. End diastolic cavity size is normal.   Myocardial blood flow was computed to be 1.33ml/g/min at rest and 2.23ml/g/min at stress. Global myocardial blood flow reserve was 1.92 and was mildly abnormal.   Coronary calcium was present on the attenuation correction CT images. Severe coronary calcifications were present. Coronary calcifications were present in the left anterior descending artery and right coronary artery distribution(s).   Findings are consistent with ischemia. The study is high risk.   Independently interpreted 07/2023: HbA1C 6.2% Cr 1.08 Na 133  05/2023: Chol 155, TG 98, HDL 51, LDL 86   EKG 11/10/2023: Normal sinus rhythm Possible Inferior infarct (cited on or before 30-Jul-2023) When compared with ECG of 30-Jul-2023 13:45, No significant change was found      Physical Exam:   Physical Exam Vitals and nursing note reviewed.  Constitutional:      General: He is not in acute distress. Neck:     Vascular: No JVD.  Cardiovascular:     Rate and Rhythm: Normal rate and regular rhythm.     Pulses:          Dorsalis pedis pulses are 1+ on the right side and 1+ on the left side.       Posterior  tibial pulses are 1+ on the right side and 0 on the left side.     Heart sounds: Normal heart sounds. No murmur heard. Pulmonary:     Effort: Pulmonary effort is normal.     Breath sounds: Normal breath sounds. No wheezing or rales.  Musculoskeletal:     Right lower leg: No edema.     Left lower leg: No edema.      VISIT DIAGNOSES: No diagnosis found.    ASSESSMENT AND PLAN: .    Manuel Richardson is a 68 y.o. male with hypertension, hyperlipidemia,  prior history of A-fib, PAD, h/o left carotid to subclavian bypass, severe left subclavian stenosis, COPD, nicotine dependence, h/o amaurosis fugax.  Exertional dyspnea: In addition to COPD, likely angina equivalent. Significant ischemia noted on recent PET/CT stress test, in addition to coronary calcification in LAD and RCA. Continue current medical therapy including aspirin, statin, metoprolol succinate 50 mg daily. Added amlodipine 5 mg daily. In addition, recommend coronary angiography and possible intervention for symptom improvement. I have reviewed the risks, indications, and alternatives to cardiac catheterization, possible angioplasty, and stenting with the patient. Risks include but are not limited to bleeding, infection, vascular injury, stroke, myocardial infection, arrhythmia, kidney injury, radiation-related injury in the case of prolonged fluoroscopy use, emergency cardiac surgery, and death. The patient understands the risks of serious complication is 1-2 in 1000 with diagnostic cardiac cath and 1-2% or less with angioplasty/stenting.  If both PCI and CABG considered equal, patient would prefer PCI in the setting of his severe COPD.  Carotid/subclavian stenosis: Extensive atherosclerosis noted on CTA in 2022 with prior left carotid-subclavian bypass. CT angiogram head/neck showed occlusion of cervical, petrous, cavernous segments of bilateral internal carotid arteries with reconstitution at the level of supraclinoid ICAs via robust PCOM bilaterally.  This seems to be unchanged compared to 04/2021.  I do not think subclavian arteries are adequately evaluated on this CT angiogram.  However, he does not have any severe symptoms related to any possibility of subclavian stenosis at this time. Continue medical management with aspirin, statin.  PAD: Lower extreme claudication, right greater than sign left, without critical limb ischemia.  In addition to continuing current medical therapy,  recommend lower extremity arterial duplex.  Hypertension: Uncontrolled. Added amlodipine 5 mg daily.  H/o Afib: Reported episode after inhalation of albuterol in 2022. He had no symptoms then and remains unclear if he had any recurrence since then. He wore Zio patch in 2022 through [facility] that did not show Afib. He is absolutely not in favor ir restarting Eliquis or any other anticoagulation.  Unless he has any recurrent A-fib, we would hold off initiating anticoagulation at this time.  Informed Consent   Shared Decision Making/Informed Consent The risks [stroke (1 in 1000), death (1 in 1000), kidney failure [usually temporary] (1 in 500), bleeding (1 in 200), allergic reaction [possibly serious] (1 in 200)], benefits (diagnostic support and management of coronary artery disease) and alternatives of a cardiac catheterization were discussed in detail with Mr. Faythe Casa and he is willing to proceed.      Meds ordered this encounter  Medications   aspirin chewable tablet 81 mg   FOLLOWED BY Linked Order Group    0.9% sodium chloride infusion    0.9% sodium chloride infusion     F/u in 4 to 6 weeks  Signed, Elder Negus, MD

## 2024-01-16 NOTE — Telephone Encounter (Signed)
 Pts sister would like a c/b in regards to the appt on 3/24 she wanted to get the okay about going. Please advise

## 2024-01-19 ENCOUNTER — Telehealth: Payer: Self-pay

## 2024-01-19 ENCOUNTER — Encounter (HOSPITAL_COMMUNITY): Payer: Self-pay | Admitting: Cardiology

## 2024-01-19 ENCOUNTER — Other Ambulatory Visit (HOSPITAL_COMMUNITY): Payer: Self-pay

## 2024-01-19 ENCOUNTER — Ambulatory Visit: Admitting: Cardiology

## 2024-01-19 ENCOUNTER — Ambulatory Visit (HOSPITAL_COMMUNITY)
Admission: RE | Admit: 2024-01-19 | Discharge: 2024-01-19 | Disposition: A | Discharge status: Home / Self Care | Source: Ambulatory Visit | Attending: Cardiology | Admitting: Cardiology

## 2024-01-19 VITALS — BP 120/72 | HR 83 | Ht 68.0 in | Wt 170.8 lb

## 2024-01-19 DIAGNOSIS — I25118 Atherosclerotic heart disease of native coronary artery with other forms of angina pectoris: Secondary | ICD-10-CM | POA: Diagnosis not present

## 2024-01-19 DIAGNOSIS — E782 Mixed hyperlipidemia: Secondary | ICD-10-CM | POA: Insufficient documentation

## 2024-01-19 DIAGNOSIS — I739 Peripheral vascular disease, unspecified: Secondary | ICD-10-CM

## 2024-01-19 MED ORDER — CLOPIDOGREL BISULFATE 75 MG PO TABS
600.0000 mg | ORAL_TABLET | Freq: Once | ORAL | 0 refills | Status: DC
  Filled 2024-01-19: qty 8, 1d supply, fill #0

## 2024-01-19 MED ORDER — CLOPIDOGREL BISULFATE 75 MG PO TABS
75.0000 mg | ORAL_TABLET | Freq: Every day | ORAL | 2 refills | Status: DC
  Filled 2024-01-19 (×2): qty 90, 90d supply, fill #0

## 2024-01-19 MED ORDER — ROSUVASTATIN CALCIUM 40 MG PO TABS
40.0000 mg | ORAL_TABLET | Freq: Every day | ORAL | 3 refills | Status: DC
  Filled 2024-01-19: qty 90, 90d supply, fill #0

## 2024-01-19 NOTE — Patient Instructions (Signed)
 Lab Work: BMP CBC  If you have labs (blood work) drawn today and your tests are completely normal, you will receive your results only by: MyChart Message (if you have MyChart) OR A paper copy in the mail If you have any lab test that is abnormal or we need to change your treatment, we will call you to review the results.   Testing/Procedures: CT ANGIO LOWER EXT BILAT W &/OR WO CONTRAST    Follow-Up: At [facility], you and your health needs are our priority.  As part of our continuing mission to provide you with exceptional heart care, we have created designated Provider Care Teams.  These Care Teams include your primary Cardiologist (physician) and Advanced Practice Providers (APPs -  Physician Assistants and Nurse Practitioners) who all work together to provide you with the care you need, when you need it.  As scheduled  Other Instructions   1st Floor: - Lobby - Registration  - Pharmacy  - Lab - Cafe  2nd Floor: - PV Lab - Diagnostic Testing (echo, CT, nuclear med)  3rd Floor: - Vacant  4th Floor: - TCTS (cardiothoracic surgery) - AFib Clinic - Structural Heart Clinic - Vascular Surgery  - Vascular Ultrasound  5th Floor: - HeartCare Cardiology (general and EP) - Clinical Pharmacy for coumadin, hypertension, lipid, weight-loss medications, and med management appointments    Valet parking services will be available as well.

## 2024-01-19 NOTE — Progress Notes (Signed)
 Cardiology Office Note:  .   Date:  01/19/2024  ID:  Manuel Richardson, DOB May 13, 1956, MRN [id number] PCP: Manuel Ferretti, DO  [facility] Providers Cardiologist:  Manuel Mainland, MD PCP: Manuel Ferretti, DO  Chief Complaint  Patient presents with   evaluate cath insertion site      History of Present Illness: .    Manuel Richardson is a 68 y.o. male with hypertension, hyperlipidemia, prior history of A-fib,  CAD (RCA CTO), PAD, h/o left carotid to subclavian bypass, severe left subclavian stenosis, COPD, nicotine dependence, h/o amaurosis fugax.   Due to severe exertional dyspnea, and high coronary calcification, patient underwent PET/CT stress test that showed moderate size ischemia in RCA territory.  Coronary angiogram on 01/16/2024 showed RCA CTO, left-to-right collaterals from LAD.  Patient had right forearm hematoma after diagnostic angiogram and attempted wiring of CTO RCA on 01/16/2024.  This is improved since then.  Patient also underwent ABI testing given reduced pulses noted in bilateral feet, results below.  Patient does have claudication, but denies any nonhealing ulcers, wounds etc.  Vitals:   01/19/24 1519  BP: 120/72  Pulse: 83  SpO2: 93%      ROS:  Review of Systems  Cardiovascular:  Positive for dyspnea on exertion. Negative for chest pain, leg swelling, palpitations and syncope.     Studies Reviewed: Marland Kitchen       EKG 01/05/2024: Normal sinus rhythm with sinus arrhythmia Cannot rule out Inferior infarct (cited on or before 30-Jul-2023) When compared with ECG of 10-Nov-2023 08:39, No significant change was found    Vascular ultrasound 01/19/2024: Summary:  Right: Resting right ankle-brachial index (0.48) indicates severe right lower  extremity arterial disease. The right toe-brachial index is abnormal (0).   Left: Resting left ankle-brachial index (0.74) indicates moderate left lower  extremity arterial disease. The left toe-brachial index is abnormal  (0.28).   Suggest Aorta-iliac duplex and lower extremity arterial duplex for further  evaluation. Suggest Peripheral Vascular Consult.   Coronary angiography 01/16/2024: LM: Moderate calcification, no significant stenosis LAD: Moderate calcification, tortuous vessel with minimal luminal irregularities.  No significant stenosis Lcx: No significant stenosis RCA: Dominant vessel with proximal 99% stenosis, followed by chronic total occlusion.          Left to right collaterals from LAD septal branches, feeling distal RCA and beyond   LVEDP 10 mmHg       PET/CT cardiac stress test 01/01/2024:   Medium, moderate reversible defect in the basal to mid inferior segments and basal inferolateral segment consistent with ischemia. LVEF is moderately reduced with increase at stress (EF 33%->36%). No TID. MBFR is mildly abnormal (1.92). Overall, these findings are high risk due to ischemia, abnormal MBFR, and resting LVEF <35%.   LV perfusion is abnormal. There is evidence of ischemia. There is no evidence of infarction. Defect 1: There is a medium defect with moderate reduction in uptake present in the mid to basal inferior and inferolateral location(s) that is reversible. There is abnormal wall motion in the defect area. Consistent with ischemia.   Rest left ventricular function is abnormal. Rest global function is moderately reduced. There were no regional wall motion abnormalities. Rest EF: 33%. Stress left ventricular function is abnormal. Stress global function is moderately reduced. There were no regional wall abnormalities. Stress EF: 36%. End diastolic cavity size is normal.   Myocardial blood flow was computed to be 1.52ml/g/min at rest and 2.20ml/g/min at stress. Global myocardial blood flow reserve was 1.92  and was mildly abnormal.   Coronary calcium was present on the attenuation correction CT images. Severe coronary calcifications were present. Coronary calcifications were present in the left  anterior descending artery and right coronary artery distribution(s).   Findings are consistent with ischemia. The study is high risk.   Independently interpreted 07/2023: HbA1C 6.2% Cr 1.08 Na 133  05/2023: Chol 155, TG 98, HDL 51, LDL 86   EKG 11/10/2023: Normal sinus rhythm Possible Inferior infarct (cited on or before 30-Jul-2023) When compared with ECG of 30-Jul-2023 13:45, No significant change was found      Physical Exam:   Physical Exam Vitals and nursing note reviewed.  Constitutional:      General: He is not in acute distress. Neck:     Vascular: No JVD.  Cardiovascular:     Rate and Rhythm: Normal rate and regular rhythm.     Pulses:          Dorsalis pedis pulses are 1+ on the right side and 1+ on the left side.       Posterior tibial pulses are 1+ on the right side and 0 on the left side.     Heart sounds: Normal heart sounds. No murmur heard. Pulmonary:     Effort: Pulmonary effort is normal.     Breath sounds: Normal breath sounds. No wheezing or rales.  Musculoskeletal:     Right lower leg: No edema.     Left lower leg: No edema.  Skin:    Comments: Right forearm ecchymosis Intact radial and ulnar pulses      VISIT DIAGNOSES:   ICD-10-CM   1. PAD (peripheral artery disease) (HCC)  I73.9 CT ANGIO LOWER EXT BILAT W &/OR WO CONTRAST    CBC    Basic metabolic panel    Lipid panel    2. Coronary artery disease involving native coronary artery of native heart with other form of angina pectoris (HCC)  I25.118     3. Mixed hyperlipidemia  E78.2 Lipid panel       ASSESSMENT AND PLAN: .    Manuel Richardson is a 68 y.o. male with hypertension, hyperlipidemia, prior history of A-fib,  CAD (RCA CTO), PAD, h/o left carotid to subclavian bypass, severe left subclavian stenosis, COPD, nicotine dependence, h/o amaurosis fugax.   Exertional dyspnea, CAD: In addition to COPD, likely angina equivalent. RCA CTO consistent with ischemia noted on PET/CT stress  test. Discussed with Manuel Richardson.  We both agreed that he would benefit from CTO PCI to his proximal RCA.  Access for CTO PCI would be a point of discussion.  He does have severely reduced bilateral lower extremity pulses.  Vascular ultrasound today shows ABI of 0.34 on the right, 0.74 on the left.  TBI on the right is 0.  Instead of obtaining aorta duplex ultrasound for further evaluation of aortoiliac disease, I have ordered CTA lower extremity, and to include lower abdominal aorta.  I am not sure if we will be able to accommodate 2 8 French sheaths in his bilateral groins, and may need to use right upper extremity access.  His right radial artery is very small and I was not able to place the sheath, therefore having to switch the access to right ulnar artery.  I think he had some bleeding from attempted right radial access leading to forearm hematoma, which is now resolved, and has only mild residual ecchymosis.  I wonder if he would be able to use right ulnar artery,  which is very large, for possibly a 7 French-if not a 8 [place] sheath, and then use left common femoral artery for a 5 [place] access.  CTA is scheduled for 01/23/2024.    I will discuss this further with Manuel Richardson before his upcoming schedule CTO PCI on 01/29/2024.  Continue aspirin 81 mg daily. Start Plavix with 600 mg loading dose, followed by 75 mg daily. Continue metoprolol succinate 50 mg daily, amlodipine 5 mg daily. He is currently on Crestor 20 mg daily with LDL of 89.  Increase Crestor to 40 mg daily and check lipid panel in May 2025.  If LDL remains >70, he will need PCSK9 elevator or Leqvio..  I have reviewed the risks, indications, and alternatives to cardiac catheterization, possible angioplasty, and stenting with the patient. Risks include but are not limited to bleeding, infection, vascular injury, stroke, myocardial infection, arrhythmia, kidney injury, radiation-related injury in the case of prolonged fluoroscopy use,  emergency cardiac surgery, and death. The patient understands the risks of serious complication is 1-2 in 1000 with diagnostic cardiac cath and 1-2% or less with angioplasty/stenting.   Carotid/subclavian stenosis: Extensive atherosclerosis noted on CTA in 2022 with prior left carotid-subclavian bypass. CT angiogram head/neck showed occlusion of cervical, petrous, cavernous segments of bilateral internal carotid arteries with reconstitution at the level of supraclinoid ICAs via robust PCOM bilaterally.  This seems to be unchanged compared to 04/2021.  I do not think subclavian arteries are adequately evaluated on this CT angiogram.  However, he does not have any severe symptoms related to any possibility of subclavian stenosis at this time. Continue medical management with aspirin, statin.  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 Will place vascular specialist referral after CTA.  Hypertension: Continue current medications.  H/o Afib: Reported episode after inhalation of albuterol in 2022. He had no symptoms then and remains unclear if he had any recurrence since then. He wore Zio patch in 2022 through [facility] that did not show Afib. He is absolutely not in favor ir restarting Eliquis or any other anticoagulation.  Unless he has any recurrent A-fib, we would hold off initiating anticoagulation at this time.  Informed Consent   Shared Decision Making/Informed Consent The risks [stroke (1 in 1000), death (1 in 1000), kidney failure [usually temporary] (1 in 500), bleeding (1 in 200), allergic reaction [possibly serious] (1 in 200)], benefits (diagnostic support and management of coronary artery disease) and alternatives of a cardiac catheterization were discussed in detail with Mr. Hyson and he is willing to proceed.      No orders of the defined types were placed in this encounter.    F/u in 4 to 6 weeks  Signed, Elder Negus, MD

## 2024-01-19 NOTE — Telephone Encounter (Signed)
 Spoke to patient and advised to come in for appointment at 4:00 pm to evaluate cath site. Pt agreed with plan of care.

## 2024-01-20 ENCOUNTER — Other Ambulatory Visit (HOSPITAL_COMMUNITY): Payer: Self-pay

## 2024-01-20 ENCOUNTER — Encounter: Payer: Self-pay | Admitting: Cardiology

## 2024-01-20 ENCOUNTER — Other Ambulatory Visit: Payer: Self-pay | Admitting: Cardiology

## 2024-01-20 LAB — VAS US ABI WITH/WO TBI
Left ABI: 0.74
Right ABI: 0.48

## 2024-01-20 LAB — BASIC METABOLIC PANEL
BUN/Creatinine Ratio: 13 (ref 10–24)
BUN: 13 mg/dL (ref 8–27)
CO2: 21 mmol/L (ref 20–29)
Calcium: 9.4 mg/dL (ref 8.6–10.2)
Chloride: 104 mmol/L (ref 96–106)
Creatinine, Ser: 1.01 mg/dL (ref 0.76–1.27)
Glucose: 121 mg/dL — ABNORMAL HIGH (ref 70–99)
Potassium: 4.6 mmol/L (ref 3.5–5.2)
Sodium: 140 mmol/L (ref 134–144)
eGFR: 82 mL/min/{1.73_m2} (ref >59)

## 2024-01-20 LAB — CBC
Hematocrit: 42.7 % (ref 37.5–51.0)
Hemoglobin: 14.4 g/dL (ref 13.0–17.7)
MCH: 30.8 pg (ref 26.6–33.0)
MCHC: 33.7 g/dL (ref 31.5–35.7)
MCV: 91 fL (ref 79–97)
Platelets: 187 10*3/uL (ref 150–450)
RBC: 4.67 x10E6/uL (ref 4.14–5.80)
RDW: 13.2 % (ref 11.6–15.4)
WBC: 7.6 10*3/uL (ref 3.4–10.8)

## 2024-01-20 MED ORDER — ROSUVASTATIN CALCIUM 40 MG PO TABS: 40.0000 mg | ORAL_TABLET | Freq: Every day | ORAL | 3 refills | Status: AC

## 2024-01-20 MED ORDER — CLOPIDOGREL BISULFATE 75 MG PO TABS: 600.0000 mg | ORAL_TABLET | Freq: Once | ORAL | 0 refills | Status: AC

## 2024-01-20 MED ORDER — CLOPIDOGREL BISULFATE 75 MG PO TABS: 75.0000 mg | ORAL_TABLET | Freq: Every day | ORAL | 2 refills | Status: DC

## 2024-01-23 ENCOUNTER — Ambulatory Visit (HOSPITAL_COMMUNITY)
Admission: RE | Admit: 2024-01-23 | Discharge: 2024-01-23 | Disposition: A | Discharge status: Home / Self Care | Source: Ambulatory Visit | Attending: Cardiology | Admitting: Cardiology

## 2024-01-23 ENCOUNTER — Other Ambulatory Visit: Payer: Self-pay | Admitting: Cardiology

## 2024-01-23 DIAGNOSIS — E782 Mixed hyperlipidemia: Secondary | ICD-10-CM

## 2024-01-23 DIAGNOSIS — I739 Peripheral vascular disease, unspecified: Secondary | ICD-10-CM | POA: Diagnosis not present

## 2024-01-23 DIAGNOSIS — K449 Diaphragmatic hernia without obstruction or gangrene: Secondary | ICD-10-CM | POA: Diagnosis not present

## 2024-01-23 DIAGNOSIS — K5730 Diverticulosis of large intestine without perforation or abscess without bleeding: Secondary | ICD-10-CM | POA: Diagnosis not present

## 2024-01-23 DIAGNOSIS — I25118 Atherosclerotic heart disease of native coronary artery with other forms of angina pectoris: Secondary | ICD-10-CM

## 2024-01-23 DIAGNOSIS — N400 Enlarged prostate without lower urinary tract symptoms: Secondary | ICD-10-CM | POA: Diagnosis not present

## 2024-01-23 DIAGNOSIS — I7143 Infrarenal abdominal aortic aneurysm, without rupture: Secondary | ICD-10-CM | POA: Diagnosis not present

## 2024-01-23 MED ORDER — IOHEXOL 350 MG/ML SOLN
125.0000 mL | Freq: Once | INTRAVENOUS | Status: AC | PRN
  Administered 2024-01-23: 125 mL via INTRAVENOUS

## 2024-01-27 ENCOUNTER — Telehealth: Payer: Self-pay | Admitting: *Deleted

## 2024-01-27 ENCOUNTER — Telehealth: Payer: Self-pay

## 2024-01-27 DIAGNOSIS — I739 Peripheral vascular disease, unspecified: Secondary | ICD-10-CM

## 2024-01-27 NOTE — Progress Notes (Signed)
 Thank you.  Victorino Dike, this patient is getting CTO PCI on 4/3. We will use Rt ulnar and Lt femoral access. Just FYI.  Thanks MJP

## 2024-01-27 NOTE — Addendum Note (Signed)
 Addended by: Neita Goodnight B on: 01/27/2024 05:23 PM   Modules accepted: Orders

## 2024-01-27 NOTE — H&P (View-Only) (Signed)
 Reviewed and discussed CT angiogram findings with Dr. [place] who will be the primary operator for upcoming coronary procedure. We should be able to use right wrist and left groin for the procedure. He does have occlusion of right leg artery. Separately, let us refer him to see vascular surgery for severe PAD.  Thanks MJP

## 2024-01-27 NOTE — Telephone Encounter (Signed)
 Spoke to sister (on [place]) and advised CT results from Dr. Rosemary Holms. Vascular surgery referral will be placed. Questions answered at time of call.

## 2024-01-27 NOTE — Telephone Encounter (Addendum)
 CTO scheduled at [facility] for: Thursday January 29, 2024 7:30 AM Arrival time [facility] Main Entrance A at: 5:30 AM  Nothing to eat after midnight prior to procedure, clear liquids until 5 AM day of procedure.  Medication instructions: -Usual morning medications can be taken with sips of water including aspirin 81 mg and Plavix 75 mg.  Plan to go home the same day, you will only stay overnight if medically necessary.  You must have responsible adult to drive you home.  Someone must be with you the first 24 hours after you arrive home.  Reviewed procedure instructions with pt's sister (DPR), Cordelia Pen.

## 2024-01-27 NOTE — Progress Notes (Signed)
 Reviewed and discussed CT angiogram findings with Dr. [place] who will be the primary operator for upcoming coronary procedure. We should be able to use right wrist and left groin for the procedure. He does have occlusion of right leg artery. Separately, let us refer him to see vascular surgery for severe PAD.  Thanks MJP

## 2024-01-29 ENCOUNTER — Ambulatory Visit (HOSPITAL_COMMUNITY)
Admission: RE | Admit: 2024-01-29 | Discharge: 2024-01-30 | Disposition: A | Discharge status: Home / Self Care | Attending: Cardiology | Admitting: Cardiology

## 2024-01-29 ENCOUNTER — Ambulatory Visit (HOSPITAL_COMMUNITY)
Admission: RE | Disposition: A | Discharge status: Home / Self Care | Payer: Self-pay | Source: Home / Self Care | Attending: Cardiology

## 2024-01-29 ENCOUNTER — Other Ambulatory Visit: Payer: Self-pay

## 2024-01-29 DIAGNOSIS — I25119 Atherosclerotic heart disease of native coronary artery with unspecified angina pectoris: Secondary | ICD-10-CM | POA: Insufficient documentation

## 2024-01-29 DIAGNOSIS — I2584 Coronary atherosclerosis due to calcified coronary lesion: Secondary | ICD-10-CM | POA: Diagnosis not present

## 2024-01-29 DIAGNOSIS — E785 Hyperlipidemia, unspecified: Secondary | ICD-10-CM | POA: Diagnosis not present

## 2024-01-29 DIAGNOSIS — I2510 Atherosclerotic heart disease of native coronary artery without angina pectoris: Secondary | ICD-10-CM

## 2024-01-29 DIAGNOSIS — J4489 Other specified chronic obstructive pulmonary disease: Secondary | ICD-10-CM | POA: Insufficient documentation

## 2024-01-29 DIAGNOSIS — I2582 Chronic total occlusion of coronary artery: Secondary | ICD-10-CM | POA: Diagnosis not present

## 2024-01-29 DIAGNOSIS — I480 Paroxysmal atrial fibrillation: Secondary | ICD-10-CM | POA: Insufficient documentation

## 2024-01-29 DIAGNOSIS — R0989 Other specified symptoms and signs involving the circulatory and respiratory systems: Secondary | ICD-10-CM | POA: Insufficient documentation

## 2024-01-29 DIAGNOSIS — I209 Angina pectoris, unspecified: Secondary | ICD-10-CM | POA: Diagnosis present

## 2024-01-29 DIAGNOSIS — Z79899 Other long term (current) drug therapy: Secondary | ICD-10-CM | POA: Insufficient documentation

## 2024-01-29 DIAGNOSIS — I739 Peripheral vascular disease, unspecified: Secondary | ICD-10-CM | POA: Diagnosis not present

## 2024-01-29 DIAGNOSIS — Z7982 Long term (current) use of aspirin: Secondary | ICD-10-CM | POA: Diagnosis not present

## 2024-01-29 DIAGNOSIS — F17200 Nicotine dependence, unspecified, uncomplicated: Secondary | ICD-10-CM | POA: Diagnosis present

## 2024-01-29 DIAGNOSIS — J439 Emphysema, unspecified: Secondary | ICD-10-CM | POA: Diagnosis present

## 2024-01-29 DIAGNOSIS — I10 Essential (primary) hypertension: Secondary | ICD-10-CM | POA: Insufficient documentation

## 2024-01-29 DIAGNOSIS — I708 Atherosclerosis of other arteries: Secondary | ICD-10-CM | POA: Diagnosis not present

## 2024-01-29 HISTORY — PX: CORONARY CTO INTERVENTION: CATH118236

## 2024-01-29 LAB — POCT ACTIVATED CLOTTING TIME
Activated Clotting Time: 262 s
Activated Clotting Time: 314 s
Activated Clotting Time: 331 s
Activated Clotting Time: 228 s
Activated Clotting Time: 256 s
Activated Clotting Time: 170 s

## 2024-01-29 SURGERY — CORONARY CTO INTERVENTION
Anesthesia: LOCAL

## 2024-01-29 MED ORDER — LIDOCAINE HCL (PF) 1 % IJ SOLN
INTRAMUSCULAR | Status: DC | PRN
  Administered 2024-01-29: 15 mL
  Administered 2024-01-29: 2 mL

## 2024-01-29 MED ORDER — FENTANYL CITRATE (PF) 100 MCG/2ML IJ SOLN
INTRAMUSCULAR | Status: AC
  Filled 2024-01-29: qty 2

## 2024-01-29 MED ORDER — SODIUM CHLORIDE 0.9% FLUSH: 3.0000 mL | INTRAVENOUS | Status: DC | PRN

## 2024-01-29 MED ORDER — LOSARTAN POTASSIUM 50 MG PO TABS
50.0000 mg | ORAL_TABLET | Freq: Every day | ORAL | Status: DC
  Filled 2024-01-29: qty 1

## 2024-01-29 MED ORDER — SODIUM CHLORIDE 0.9 % WEIGHT BASED INFUSION: 3.0000 mL/kg/h | INTRAVENOUS | Status: DC

## 2024-01-29 MED ORDER — BUDESON-GLYCOPYRROL-FORMOTEROL 160-9-4.8 MCG/ACT IN AERO: 2.0000 | INHALATION_SPRAY | Freq: Two times a day (BID) | RESPIRATORY_TRACT | Status: DC

## 2024-01-29 MED ORDER — ASPIRIN 81 MG PO TBEC
81.0000 mg | DELAYED_RELEASE_TABLET | Freq: Every day | ORAL | Status: DC
  Filled 2024-01-29: qty 1

## 2024-01-29 MED ORDER — MIDAZOLAM HCL 2 MG/2ML IJ SOLN
INTRAMUSCULAR | Status: AC
  Filled 2024-01-29: qty 2

## 2024-01-29 MED ORDER — VERAPAMIL HCL 2.5 MG/ML IV SOLN
INTRAVENOUS | Status: AC
  Filled 2024-01-29: qty 2

## 2024-01-29 MED ORDER — ACETAMINOPHEN 325 MG PO TABS: 650.0000 mg | ORAL_TABLET | ORAL | Status: DC | PRN

## 2024-01-29 MED ORDER — SODIUM CHLORIDE 0.9 % WEIGHT BASED INFUSION
1.0000 mL/kg/h | INTRAVENOUS | Status: DC
  Administered 2024-01-29: 1 mL/kg/h via INTRAVENOUS

## 2024-01-29 MED ORDER — AMLODIPINE BESYLATE 5 MG PO TABS
5.0000 mg | ORAL_TABLET | Freq: Every day | ORAL | Status: DC
  Filled 2024-01-29: qty 1

## 2024-01-29 MED ORDER — NITROGLYCERIN 1 MG/10 ML FOR IR/CATH LAB
INTRA_ARTERIAL | Status: AC
  Filled 2024-01-29: qty 10

## 2024-01-29 MED ORDER — LIDOCAINE HCL (PF) 1 % IJ SOLN
INTRAMUSCULAR | Status: AC
Start: 2024-01-29 — End: ?
  Filled 2024-01-29: qty 30

## 2024-01-29 MED ORDER — METOPROLOL SUCCINATE ER 50 MG PO TB24
50.0000 mg | ORAL_TABLET | Freq: Every day | ORAL | Status: DC
  Filled 2024-01-29: qty 1

## 2024-01-29 MED ORDER — ONDANSETRON HCL 4 MG/2ML IJ SOLN: 4.0000 mg | Freq: Four times a day (QID) | INTRAMUSCULAR | Status: DC | PRN

## 2024-01-29 MED ORDER — ROSUVASTATIN CALCIUM 20 MG PO TABS
40.0000 mg | ORAL_TABLET | Freq: Every day | ORAL | Status: DC
Start: 2024-01-29 — End: 2024-01-30
  Filled 2024-01-29: qty 2

## 2024-01-29 MED ORDER — SODIUM CHLORIDE 0.9% FLUSH
3.0000 mL | Freq: Two times a day (BID) | INTRAVENOUS | Status: DC
  Administered 2024-01-29 – 2024-01-30 (×2): 3 mL via INTRAVENOUS

## 2024-01-29 MED ORDER — IOHEXOL 350 MG/ML SOLN
INTRAVENOUS | Status: DC | PRN
  Administered 2024-01-29: 130 mL

## 2024-01-29 MED ORDER — CLOPIDOGREL BISULFATE 75 MG PO TABS: 75.0000 mg | ORAL_TABLET | ORAL | Status: DC

## 2024-01-29 MED ORDER — HEPARIN SODIUM (PORCINE) 1000 UNIT/ML IJ SOLN
INTRAMUSCULAR | Status: DC | PRN
  Administered 2024-01-29: 3000 [IU] via INTRAVENOUS
  Administered 2024-01-29: 8000 [IU] via INTRAVENOUS

## 2024-01-29 MED ORDER — HEPARIN (PORCINE) IN NACL 1000-0.9 UT/500ML-% IV SOLN
INTRAVENOUS | Status: DC | PRN
  Administered 2024-01-29 (×2): 1500 mL

## 2024-01-29 MED ORDER — HEPARIN SODIUM (PORCINE) 1000 UNIT/ML IJ SOLN
INTRAMUSCULAR | Status: AC
  Filled 2024-01-29: qty 10

## 2024-01-29 MED ORDER — HYDRALAZINE HCL 20 MG/ML IJ SOLN: 10.0000 mg | INTRAMUSCULAR | Status: AC | PRN

## 2024-01-29 MED ORDER — ASPIRIN 81 MG PO CHEW: 81.0000 mg | CHEWABLE_TABLET | ORAL | Status: DC

## 2024-01-29 MED ORDER — MIDAZOLAM HCL 2 MG/2ML IJ SOLN
INTRAMUSCULAR | Status: DC | PRN
  Administered 2024-01-29: 1 mg via INTRAVENOUS
  Administered 2024-01-29: 2 mg via INTRAVENOUS

## 2024-01-29 MED ORDER — SODIUM CHLORIDE 0.9 % IV SOLN: 250.0000 mL | INTRAVENOUS | Status: DC | PRN

## 2024-01-29 MED ORDER — EZETIMIBE 10 MG PO TABS
10.0000 mg | ORAL_TABLET | Freq: Every day | ORAL | Status: DC
  Filled 2024-01-29: qty 1

## 2024-01-29 MED ORDER — FLUTICASONE FUROATE-VILANTEROL 100-25 MCG/ACT IN AEPB
1.0000 | INHALATION_SPRAY | Freq: Every day | RESPIRATORY_TRACT | Status: DC
  Administered 2024-01-30: 1 via RESPIRATORY_TRACT
  Filled 2024-01-29: qty 28

## 2024-01-29 MED ORDER — FENTANYL CITRATE (PF) 100 MCG/2ML IJ SOLN
INTRAMUSCULAR | Status: DC | PRN
  Administered 2024-01-29 (×2): 25 ug via INTRAVENOUS
  Administered 2024-01-29: 50 ug via INTRAVENOUS

## 2024-01-29 MED ORDER — UMECLIDINIUM BROMIDE 62.5 MCG/ACT IN AEPB
1.0000 | INHALATION_SPRAY | Freq: Every day | RESPIRATORY_TRACT | Status: DC
  Administered 2024-01-30: 1 via RESPIRATORY_TRACT
  Filled 2024-01-29: qty 7

## 2024-01-29 MED ORDER — HEPARIN SODIUM (PORCINE) 1000 UNIT/ML IJ SOLN
INTRAMUSCULAR | Status: AC
Start: 2024-01-29 — End: ?
  Filled 2024-01-29: qty 10

## 2024-01-29 MED ORDER — VERAPAMIL HCL 2.5 MG/ML IV SOLN
INTRAVENOUS | Status: DC | PRN
Start: 2024-01-29 — End: 2024-01-29
  Administered 2024-01-29: 8 mL via INTRA_ARTERIAL

## 2024-01-29 SURGICAL SUPPLY — 29 items
CATH GUIDELINER 8F (CATHETERS) IMPLANT
CATH GUIDELINER COAST (CATHETERS) IMPLANT
CATH INFINITI 5 FR JL3.5 (CATHETERS) IMPLANT
CATH MACH1 8F AL1 90CM (CATHETERS) IMPLANT
CATH MACH1 8FR AL.75 90CM (CATHETERS) IMPLANT
CATH MAMBA FLEX 135 (CATHETERS) IMPLANT
DEVICE RAD COMP TR BAND LRG (VASCULAR PRODUCTS) IMPLANT
GLIDESHEATH SLEND SS 6F .021 (SHEATH) IMPLANT
GUIDEWIRE INQWIRE 1.5J.035X260 (WIRE) IMPLANT
GUIDEWIRE JUDO 3 190 (WIRE) IMPLANT
GUIDEWIRE JUDO 6 190 (WIRE) IMPLANT
INQWIRE 1.5J .035X260CM (WIRE) ×1 IMPLANT
KIT ENCORE 26 ADVANTAGE (KITS) IMPLANT
KIT HEART LEFT (KITS) IMPLANT
KIT HEMO VALVE WATCHDOG (MISCELLANEOUS) IMPLANT
KIT MICROPUNCTURE NIT STIFF (SHEATH) IMPLANT
KIT SYRINGE INJ CVI SPIKEX1 (MISCELLANEOUS) IMPLANT
PACK CARDIAC CATHETERIZATION (CUSTOM PROCEDURE TRAY) ×2 IMPLANT
SHEATH BRITE TIP 8FR 35CM (SHEATH) IMPLANT
SHEATH PINNACLE 9F 10CM (SHEATH) IMPLANT
SHEATH PROBE COVER 6X72 (BAG) IMPLANT
SYR CONTROL 10ML ANGIOGRAPHIC (SYRINGE) IMPLANT
TUBING CIL FLEX 10 FLL-RA (TUBING) IMPLANT
WIRE ASAHI CONFIANZPRO12 300CM (WIRE) IMPLANT
WIRE ASAHI PROWATER 180CM (WIRE) IMPLANT
WIRE ASAHI PROWATER 300CM (WIRE) IMPLANT
WIRE EMERALD 3MM-J .035X260CM (WIRE) IMPLANT
WIRE FIGHTER CROSSING 190CM (WIRE) IMPLANT
WIRE GUIDE ASAHI EXTENSION 165 (WIRE) IMPLANT

## 2024-01-29 NOTE — Progress Notes (Signed)
 Site area: L groin Site Prior to Removal:  Level 1 Pressure Applied For: 45 min Manual:   yes Patient Status During Pull:  stable Post Pull Site:  Level 1 Post Pull Instructions Given:  Yes Post Pull Pulses Present: L DP +1 Dressing Applied:  gauze & tegaderm Bedrest begins @ 1320 Comments: Pt resting, reports mild tenderness of site, bleeding controlled, slight ecchymosis around insertion site, but soft, no palpable hematoma.  Pt verbalized understanding of post pull instructions, no questions at this time.  Will continue to monitor.

## 2024-01-29 NOTE — Interval H&P Note (Signed)
 History and Physical Interval Note:  01/29/2024 7:25 AM  Manuel Richardson  has presented today for surgery, with the diagnosis of cto.  The various methods of treatment have been discussed with the patient and family. After consideration of risks, benefits and other options for treatment, the patient has consented to  Procedure(s): CORONARY CTO INTERVENTION (N/A) as a surgical intervention.  The patient's history has been reviewed, patient examined, no change in status, stable for surgery.  I have reviewed the patient's chart and labs.  Questions were answered to the patient's satisfaction.  The procedure and risks were reviewed including but not limited to death, myocardial infarction, stroke, arrythmias, perforation, major vascular complication, bleeding, transfusion, emergency surgery, dye allergy, or renal dysfunction. The patient voices understanding and is agreeable to proceed.    [name] [facility] 01/29/2024 7:25 AM

## 2024-01-29 NOTE — Plan of Care (Signed)

## 2024-01-30 ENCOUNTER — Encounter (HOSPITAL_COMMUNITY): Payer: Self-pay | Admitting: Cardiology

## 2024-01-30 DIAGNOSIS — Z7982 Long term (current) use of aspirin: Secondary | ICD-10-CM | POA: Diagnosis not present

## 2024-01-30 DIAGNOSIS — J4489 Other specified chronic obstructive pulmonary disease: Secondary | ICD-10-CM | POA: Diagnosis not present

## 2024-01-30 DIAGNOSIS — I708 Atherosclerosis of other arteries: Secondary | ICD-10-CM | POA: Diagnosis not present

## 2024-01-30 DIAGNOSIS — I2584 Coronary atherosclerosis due to calcified coronary lesion: Secondary | ICD-10-CM | POA: Diagnosis not present

## 2024-01-30 DIAGNOSIS — I739 Peripheral vascular disease, unspecified: Secondary | ICD-10-CM | POA: Diagnosis not present

## 2024-01-30 DIAGNOSIS — I2582 Chronic total occlusion of coronary artery: Secondary | ICD-10-CM | POA: Diagnosis not present

## 2024-01-30 DIAGNOSIS — R0989 Other specified symptoms and signs involving the circulatory and respiratory systems: Secondary | ICD-10-CM | POA: Diagnosis not present

## 2024-01-30 DIAGNOSIS — I25119 Atherosclerotic heart disease of native coronary artery with unspecified angina pectoris: Secondary | ICD-10-CM | POA: Diagnosis not present

## 2024-01-30 DIAGNOSIS — E785 Hyperlipidemia, unspecified: Secondary | ICD-10-CM | POA: Diagnosis not present

## 2024-01-30 DIAGNOSIS — I480 Paroxysmal atrial fibrillation: Secondary | ICD-10-CM | POA: Diagnosis not present

## 2024-01-30 DIAGNOSIS — I10 Essential (primary) hypertension: Secondary | ICD-10-CM | POA: Diagnosis not present

## 2024-01-30 DIAGNOSIS — Z79899 Other long term (current) drug therapy: Secondary | ICD-10-CM | POA: Diagnosis not present

## 2024-01-30 LAB — CBC
HCT: 35.6 % — ABNORMAL LOW (ref 39.0–52.0)
Hemoglobin: 12.2 g/dL — ABNORMAL LOW (ref 13.0–17.0)
MCH: 30.9 pg (ref 26.0–34.0)
MCHC: 34.3 g/dL (ref 30.0–36.0)
MCV: 90.1 fL (ref 80.0–100.0)
Platelets: 161 10*3/uL (ref 150–400)
RBC: 3.95 MIL/uL — ABNORMAL LOW (ref 4.22–5.81)
RDW: 13.3 % (ref 11.5–15.5)
WBC: 5.8 10*3/uL (ref 4.0–10.5)
nRBC: 0 % (ref 0.0–0.2)

## 2024-01-30 LAB — BASIC METABOLIC PANEL WITH GFR
Anion gap: 10 (ref 5–15)
BUN: 15 mg/dL (ref 8–23)
CO2: 21 mmol/L — ABNORMAL LOW (ref 22–32)
Calcium: 8.7 mg/dL — ABNORMAL LOW (ref 8.9–10.3)
Chloride: 107 mmol/L (ref 98–111)
Creatinine, Ser: 0.98 mg/dL (ref 0.61–1.24)
GFR, Estimated: >60 mL/min (ref >60)
Glucose, Bld: 109 mg/dL — ABNORMAL HIGH (ref 70–99)
Potassium: 3.9 mmol/L (ref 3.5–5.1)
Sodium: 138 mmol/L (ref 135–145)

## 2024-01-30 MED ORDER — NITROGLYCERIN 0.4 MG SL SUBL: 0.4000 mg | SUBLINGUAL_TABLET | SUBLINGUAL | 3 refills | Status: AC | PRN

## 2024-01-30 MED FILL — Nitroglycerin IV Soln 100 MCG/ML in D5W: INTRA_ARTERIAL | Qty: 10 | Status: AC

## 2024-01-30 NOTE — Progress Notes (Signed)
 Pt without PCI therefore will not see. Could be referred from MD office for CRPII with dx of stable angina if deemed appropriate.  Ethelda Chick BS, ACSM-CEP 01/30/2024 9:20 AM

## 2024-01-30 NOTE — Discharge Summary (Signed)
 Discharge Summary    Patient ID: ALIOU MEALEY MRN: [id number]; DOB: 11-18-1955  Admit date: 01/29/2024 Discharge date: 01/30/2024  PCP:  Charlane Ferretti, DO   [facility] Providers Cardiologist:  Elder Negus, MD      Discharge Diagnoses    Principal Problem:   Angina pectoris [facility]) Active Problems:   Hyperlipidemia   Primary hypertension   COPD with emphysema (HCC)   Tobacco use disorder   Bilateral carotid bruits   Coronary artery calcification    Diagnostic Studies/Procedures    CTO 01/29/24:   Prox RCA to Mid RCA lesion is 100% stenosed.   Post intervention, there is a 100% residual stenosis.   The radiation dose exceeded thresholds defined in the "Patient Radiation Dose Management For Interventional Medical Procedures With Extensive Use of Fluoroscopy" policy. Specific follow up instructions will be provided to the patient prior to discharge.   Unsuccessful CTO PCI of the RCA - unable to cross with a wire     Plan: will observe overnight. Can DC Plavix. Recommend medical therapy    LHC 01/16/24:  Coronary angiography 01/16/2024: LM: Moderate calcification, no significant stenosis LAD: Moderate calcification, tortuous vessel with minimal luminal irregularities.  No significant stenosis Lcx: No significant stenosis RCA: Dominant vessel with proximal 99% stenosis, followed by chronic total occlusion.          Left to right collaterals from LAD septal branches, feeling distal RCA and beyond   LVEDP 10 mmHg      Unsuccessful attempt of antegrade approach with Prowater and TelePort microcatheter support.  Decided not to attempt further to avoid any hematoma.  Discussed with Dr. [place].  Will arrange for CTO intervention on April 3 on May 8.  _____________   History of Present Illness     Manuel Richardson is a 68 y.o. male with  hypertension, hyperlipidemia, prior history of A-fib,  CAD (RCA CTO), PAD, h/o left carotid to subclavian bypass, severe  left subclavian stenosis, COPD, nicotine dependence, h/o amaurosis fugax.    Due to severe exertional dyspnea, and high coronary calcification, patient underwent PET/CT stress test that showed moderate size ischemia in RCA territory.  Coronary angiogram on 01/16/2024 showed RCA CTO, left-to-right collaterals from LAD.   Patient had right forearm hematoma after diagnostic angiogram and attempted wiring of CTO RCA on 01/16/2024.  This is improved since then.  Patient also underwent ABI testing given reduced pulses noted in bilateral feet, results below.  Patient does have claudication, but denies any nonhealing ulcers, wounds etc.  Hospital Course     Consultants: none  CTO of RCA CAD Pt presented to [facility] for scheduled RCA CTO. Unfortunately, CTO intervention was unsuccessful as Dr. Sw[place] was unabel to cross with a wire. Plavix was discontinued. Continue ASA.  - left groin with small hematoma, stable after ambulation - right radial site C/D/I   Hypertension - continue 5 mg amlodipine, 50 mg torpol, 50 mg losartan   Hyperlipidemia with LDL goal < 70 May consider lower LDL goal given CTO 01/05/2024: Cholesterol, Total 178; HDL 49; LDL Chol Calc (NIH) 89; Triglycerides 237 On 40 mg crestor and 10 mg zetia Will refer to lipid clinic for potential PCSK9i vs inclisirin    PAF - continue toprol - in the setting of COVID in 2022, no known recurrence   Severe left subclavian stenosis PAD Left carotid subclavian bypass - continue risk factor modification   Pt seen and examined by Dr. Jacinto Halim and deemed stable for  discharge. Follow up has been arranged.       Did the patient have an acute coronary syndrome (MI, NSTEMI, STEMI, etc) this admission?:  No                               Did the patient have a percutaneous coronary intervention (stent / angioplasty)?:  No.        The patient will be scheduled for a TOC follow up appointment in 7-14 days.  A message has been sent to the [facility] and Scheduling Pool at the office where the patient should be seen for follow up.  _____________  Discharge Vitals Blood pressure 131/78, pulse 83, temperature 98.8 F (37.1 C), temperature source Oral, resp. rate 20, height 5' 8.5" (1.74 m), weight 76.2 kg, SpO2 94%.  Filed Weights   01/29/24 0603  Weight: 76.2 kg   Physical Exam Constitutional:      Appearance: Normal appearance.  HENT:     Head: Normocephalic.  Cardiovascular:     Rate and Rhythm: Normal rate and regular rhythm.     Heart sounds: No murmur heard. Pulmonary:     Effort: Pulmonary effort is normal. No respiratory distress.  Abdominal:     General: Abdomen is flat.     Palpations: Abdomen is soft.  Musculoskeletal:     Cervical back: Normal range of motion.  Skin:    General: Skin is warm and dry.  Neurological:     Mental Status: He is alert and oriented to person, place, and time.  Psychiatric:        Mood and Affect: Mood normal.    Right radial site C/D/I Left groin site with small hematoma, stable   Labs & Radiologic Studies    CBC Recent Labs    01/30/24 0503  WBC 5.8  HGB 12.2*  HCT 35.6*  MCV 90.1  PLT 161   Basic Metabolic Panel Recent Labs    60/45/40 0503  NA 138  K 3.9  CL 107  CO2 21*  GLUCOSE 109*  BUN 15  CREATININE 0.98  CALCIUM 8.7*   Liver Function Tests No results for input(s): "AST", "ALT", "ALKPHOS", "BILITOT", "PROT", "ALBUMIN" in the last 72 hours. No results for input(s): "LIPASE", "AMYLASE" in the last 72 hours. High Sensitivity Troponin:   No results for input(s): "TROPONINIHS" in the last 720 hours.  BNP Invalid input(s): "POCBNP" D-Dimer No results for input(s): "DDIMER" in the last 72 hours. Hemoglobin A1C No results for input(s): "HGBA1C" in the last 72 hours. Fasting Lipid Panel No results for input(s): "CHOL", "HDL", "LDLCALC", "TRIG", "CHOLHDL", "LDLDIRECT" in the last 72 hours. Thyroid Function Tests No results for input(s): "TSH",  "T4TOTAL", "T3FREE", "THYROIDAB" in the last 72 hours.  Invalid input(s): "FREET3" _____________  CARDIAC CATHETERIZATION Result Date: 01/29/2024   Prox RCA to Mid RCA lesion is 100% stenosed.   Post intervention, there is a 100% residual stenosis.   The radiation dose exceeded thresholds defined in the "Patient Radiation Dose Management For Interventional Medical Procedures With Extensive Use of Fluoroscopy" policy. Specific follow up instructions will be provided to the patient prior to discharge. Unsuccessful CTO PCI of the RCA - unable to cross with a wire Plan: will observe overnight. Can DC Plavix. Recommend medical therapy   CT ANGIO AO+BIFEM W & OR WO CONTRAST Result Date: 01/26/2024 CLINICAL DATA:  PAD, suspected distal aortic stenosis EXAM: CT ANGIOGRAPHY OF ABDOMINAL AORTA  WITH ILIOFEMORAL RUNOFF TECHNIQUE: Multidetector CT imaging of the abdomen, pelvis and lower extremities was performed using the standard protocol during bolus administration of intravenous contrast. Multiplanar CT image reconstructions and MIPs were obtained to evaluate the vascular anatomy. RADIATION DOSE REDUCTION: This exam was performed according to the departmental dose-optimization program which includes automated exposure control, adjustment of the mA and/or kV according to patient size and/or use of iterative reconstruction technique. CONTRAST:  OMNIPAQUE IOHEXOL 350 MG/ML SOLN COMPARISON:  PET-CT 01/01/2024 FINDINGS: VASCULAR Aorta: Extensive calcified aortic plaque throughout. 3.1 cm fusiform dilatation of the infrarenal segment. No dissection. Some eccentric nonocclusive mural thrombus in the suprarenal and infrarenal segments. There is moderate stenosis the aortic bifurcation. Celiac: Patent without evidence of aneurysm, dissection, vasculitis or significant stenosis. SMA: Partially calcified ostial plaque over length of at least 1.9 cm, resulting in at least mild stenosis, atheromatous but patent distally.  Renals: Single bilaterally, both with partially calcified plaque along the length of the main renal artery segments, left worse than right, of possible hemodynamic significance. IMA: Origin occlusion. Distal branches reconstituted by visceral collaterals. RIGHT Lower Extremity Inflow: Common and external iliac long segment occlusion. Internal iliac proximal occlusion, with collateral reconstitution of distal branches. Outflow: Common femoral collateral reconstitution, with mild eccentric partially calcified plaque. Deep femoral branches patent. SFA scattered plaque, with focal stenosis of at least mild severity at the adductor hiatus. Popliteal scattered calcified plaque, resulting in short segment stenosis above the knee of at least mild severity, atheromatous but patent distally. Runoff: Scattered coarse proximal calcifications, but apparently contiguous three-vessel tibial runoff. LEFT Lower Extremity Inflow: Common iliac mild origin stenosis, heavily atheromatous but patent distally. Internal iliac eccentric partially calcified atheromatous plaque without high-grade stenosis or aneurysm. External iliac short-segment origin stenosis of at least mild severity, with eccentric calcified plaque throughout its length distally. Outflow: Common femoral eccentric partially calcified plaque but no high-grade stenosis. Deep femoral branches patent. SFA extensive eccentric scattered calcified plaque without high-grade stenosis. Popliteal scattered eccentric calcified plaque resulting in at least mild areas of tandem stenosis above the knee, atheromatous but patent distally. Runoff: Atheromatous but apparently contiguous 3-vessel tibial runoff. Veins: No obvious venous abnormality within the limitations of this arterial phase study. Review of the MIP images confirms the above findings. NON-VASCULAR Lower chest: No pleural or pericardial effusion. Small hiatal hernia. Pulmonary emphysema. Hepatobiliary: No focal liver  abnormality is seen. No gallstones, gallbladder wall thickening, or biliary dilatation. Pancreas: Unremarkable. No pancreatic ductal dilatation or surrounding inflammatory changes. Spleen: Normal in size without focal abnormality. Adrenals/Urinary Tract: Adrenal glands are unremarkable. Kidneys are normal, without renal calculi, focal lesion, or hydronephrosis. Bladder is unremarkable. Stomach/Bowel: Small hiatal hernia. Small bowel decompressed. The colon is partially distended, with scattered distal descending and proximal sigmoid diverticula; no adjacent inflammatory change. Lymphatic: No abdominal or pelvic adenopathy. Reproductive: Prostate enlargement Other: No ascites.  No free air. Musculoskeletal: Bilateral femoral head AVN without subchondral collapse. IMPRESSION: 1. 3.1 cm infrarenal abdominal aortic aneurysm. Recommend follow-up ultrasound every 3 years. 2. 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 3. 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 4. 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 5. Small hiatal hernia. 6. Colonic diverticulosis. Electronically Signed   By: Corlis Leak M.D.   On: 01/26/2024 13:25   VAS [place] ABI WITH/WO TBI Result Date: 01/20/2024  LOWER EXTREMITY DOPPLER STUDY Patient Name:  Manuel Richardson  Date of Exam:   01/19/2024 Medical Rec #: [id number]  Accession #:    [id number] Date of Birth: 09/02/1956      Patient Gender: M Patient Age:   48 years Exam Location:  [facility] Procedure:      VAS [place] ABI WITH/WO TBI Referring Phys: [facility] PATWARDHAN --------------------------------------------------------------------------------  Indications: Claudication, and peripheral artery disease. Patient reports right              lower extremity pain, cramping and burning after walking from the              parking garage to the office. He has a history of PAD with abnormal              right ABI of 0.53 and duplex which showed severe  right inflow              disease with monophasic waveforms from the common femoral to the              ankle. His left leg does experience some pain, particularly in the              hip, but not as bad as the right side. He saw a vascular surgeon in              2016 but declined intervention at that time. He is needing to have              his heart worked on first but then would like to be evaluated for              PAD again. High Risk Factors: Hypertension, hyperlipidemia, past history of smoking,                    coronary artery disease.  Vascular Interventions: H/O left carotid to subclavian bypass. Comparison Study: Previous ABIs at [facility] on 02/22/2015 showed right                   ABI of 0.53 and left ABI of 1.04 Performing Technologist: Olegario Shearer RVT  Examination Guidelines: A complete evaluation includes at minimum, Doppler waveform signals and systolic blood pressure reading at the level of bilateral brachial, anterior tibial, and posterior tibial arteries, when vessel segments are accessible. Bilateral testing is considered an integral part of a complete examination. Photoelectric Plethysmograph (PPG) waveforms and toe systolic pressure readings are included as required and additional duplex testing as needed. Limited examinations for reoccurring indications may be performed as noted.  ABI Findings: +---------+------------------+-----+----------+--------+ Right    Rt Pressure (mmHg)IndexWaveform  Comment  +---------+------------------+-----+----------+--------+ Brachial 148                                       +---------+------------------+-----+----------+--------+ PTA      71                0.48 monophasic         +---------+------------------+-----+----------+--------+ PERO                            absent             +---------+------------------+-----+----------+--------+ DP                              absent              +---------+------------------+-----+----------+--------+  Great Toe0                 0.00 Abnormal           +---------+------------------+-----+----------+--------+ +---------+------------------+-----+----------+-------+ Left     Lt Pressure (mmHg)IndexWaveform  Comment +---------+------------------+-----+----------+-------+ Brachial 131                                      +---------+------------------+-----+----------+-------+ PTA      102               0.69 monophasic        +---------+------------------+-----+----------+-------+ PERO     109               0.74 monophasic        +---------+------------------+-----+----------+-------+ DP       99                0.67 monophasic        +---------+------------------+-----+----------+-------+ Great Toe41                0.28                   +---------+------------------+-----+----------+-------+ +-------+-----------+-----------+------------+------------+ ABI/TBIToday's ABIToday's TBIPrevious ABIPrevious TBI +-------+-----------+-----------+------------+------------+ Right  0.48       0          0.53                     +-------+-----------+-----------+------------+------------+ Left   0.74       0.28       1.04                     +-------+-----------+-----------+------------+------------+ Right ABIs appear decreased compared to prior study on 02/22/15. Left ABIs appear decreased compared to prior study on 02/22/15.  Summary: Right: Resting right ankle-brachial index indicates severe right lower extremity arterial disease. The right toe-brachial index is abnormal. Left: Resting left ankle-brachial index indicates moderate left lower extremity arterial disease. The left toe-brachial index is abnormal. *See table(s) above for measurements and observations.  Suggest Aorta-iliac duplex and lower extremity arterial duplex for further evaluation. Suggest Peripheral Vascular Consult. Electronically signed by Julien Nordmann MD on 01/20/2024 at 8:24:35 AM.    Final    CARDIAC CATHETERIZATION Result Date: 01/16/2024 Images from the original result were not included. Coronary angiography 01/16/2024: LM: Moderate calcification, no significant stenosis LAD: Moderate calcification, tortuous vessel with minimal luminal irregularities.  No significant stenosis Lcx: No significant stenosis RCA: Dominant vessel with proximal 99% stenosis, followed by chronic total occlusion.          Left to right collaterals from LAD septal branches, feeling distal RCA and beyond LVEDP 10 mmHg Unsuccessful attempt of antegrade approach with Prowater and TelePort microcatheter support.  Decided not to attempt further to avoid any hematoma.  Discussed with Dr. [place].  Will arrange for CTO intervention on April 3 on May 8. Elder Negus, MD   NM PET CT CARDIAC PERFUSION MULTI W/ABSOLUTE BLOODFLOW Result Date: 01/01/2024   Medium, moderate reversible defect in the basal to mid inferior segments and basal inferolateral segment consistent with ischemia. LVEF is moderately reduced with increase at stress (EF 33%->36%). No TID. MBFR is mildly abnormal (1.92). Overall, these findings are high risk due to ischemia, abnormal MBFR, and resting LVEF <35%.   LV perfusion is abnormal. There is evidence of ischemia. There is no evidence of  infarction. Defect 1: There is a medium defect with moderate reduction in uptake present in the mid to basal inferior and inferolateral location(s) that is reversible. There is abnormal wall motion in the defect area. Consistent with ischemia.   Rest left ventricular function is abnormal. Rest global function is moderately reduced. There were no regional wall motion abnormalities. Rest EF: 33%. Stress left ventricular function is abnormal. Stress global function is moderately reduced. There were no regional wall abnormalities. Stress EF: 36%. End diastolic cavity size is normal.   Myocardial blood flow was computed to be  1.54ml/g/min at rest and 2.81ml/g/min at stress. Global myocardial blood flow reserve was 1.92 and was mildly abnormal.   Coronary calcium was present on the attenuation correction CT images. Severe coronary calcifications were present. Coronary calcifications were present in the left anterior descending artery and right coronary artery distribution(s).   Findings are consistent with ischemia. The study is high risk. Electronically signed by Lennie Odor, MD ___________________________________________________________________________ ________________________________________________ CLINICAL DATA:  This over-read does not include interpretation of cardiac or coronary anatomy or pathology. The Cardiac PET CT interpretation by the cardiologist is attached. COMPARISON:  None Available. FINDINGS: Cardiovascular: Aortic atherosclerosis. Normal heart size. Three-vessel coronary artery calcifications. No pericardial effusion. Limited Mediastinum/Nodes: No enlarged mediastinal, hilar, or axillary lymph nodes. Trachea and esophagus demonstrate no significant findings. Limited Lungs/Pleura: Severe emphysema. Diffuse bilateral bronchial wall thickening. Bibasilar scarring or atelectasis. No pleural effusion or pneumothorax. Upper Abdomen: No acute abnormality. Musculoskeletal: No chest wall abnormality. No acute osseous findings. IMPRESSION: 1. Severe emphysema and diffuse bilateral bronchial wall thickening. 2. Coronary artery disease. Aortic Atherosclerosis (ICD10-I70.0) and Emphysema (ICD10-J43.9). Electronically Signed   By: Jearld Lesch M.D.   On: 01/01/2024 16:22  Disposition   Pt is being discharged home today in good condition.  Follow-up Plans & Appointments     Discharge Instructions     AMB Referral to Cardiac Rehabilitation - Phase II   Complete by: As directed    Diagnosis: Stable Angina   After initial evaluation and assessments completed: Virtual Based Care may be provided alone or in conjunction  with Phase 2 Cardiac Rehab based on patient barriers.: Yes   Intensive Cardiac Rehabilitation (ICR) [facility] location only OR Traditional Cardiac Rehabilitation (TCR) *If criteria for ICR are not met will enroll in TCR [facility] only): Yes   AMB Referral to [facility]   Complete by: As directed    Malena Peer   Reason For Referral: Lipids   Diet - low sodium heart healthy   Complete by: As directed    Discharge instructions   Complete by: As directed    No driving for 2 days. No lifting over 5 lbs for 1 week. No sexual activity for 1 week. Keep procedure site clean & dry. If you notice increased pain, swelling, bleeding or pus, call/return!  You may shower, but no soaking baths/hot tubs/pools for 1 week.   Increase activity slowly   Complete by: As directed         Discharge Medications   Allergies as of 01/30/2024   No Known Allergies      Medication List     STOP taking these medications    clopidogrel 75 MG tablet Commonly known as: Plavix       TAKE these medications    amLODipine 5 MG tablet Commonly known as: NORVASC Take 1 tablet (5 mg total) by mouth daily.   aspirin EC 81 MG tablet Take 1 tablet (81 mg total)  by mouth daily. Swallow whole.   Breztri Aerosphere 160-9-4.8 MCG/ACT Aero Generic drug: budeson-glycopyrrolate-formoterol Inhale 2 puffs into the lungs 2 (two) times daily.   ezetimibe 10 MG tablet Commonly known as: ZETIA Take 1 tablet (10 mg total) by mouth daily.   losartan 50 MG tablet Commonly known as: COZAAR Take 1 tablet (50 mg total) by mouth daily.   metoprolol succinate 50 MG 24 hr tablet Commonly known as: Toprol XL Take 1 tablet (50 mg total) by mouth daily. Take with or immediately following a meal.   nitroGLYCERIN 0.4 MG SL tablet Commonly known as: Nitrostat Place 1 tablet (0.4 mg total) under the tongue every 5 (five) minutes as needed for chest pain.   rosuvastatin 40 MG tablet Commonly known as: CRESTOR Take 1 tablet  (40 mg total) by mouth daily.           Outstanding Labs/Studies   Lipid clinic for PCSK9i vs inclisirin   Duration of Discharge Encounter: APP Time: 25 minutes   Signed, Marcelino Duster, PA 01/30/2024, 9:20 AM

## 2024-01-31 ENCOUNTER — Other Ambulatory Visit: Payer: Self-pay | Admitting: Cardiology

## 2024-02-02 LAB — LIPOPROTEIN A (LPA): Lipoprotein (a): 46.6 nmol/L — ABNORMAL HIGH (ref <75.0)

## 2024-02-03 NOTE — Progress Notes (Unsigned)
 Cardiology Office Note:  .   Date:  02/03/2024  ID:  Manuel Richardson, DOB [birth year], MRN [id number] PCP: Manuel Ferretti, DO  [facility] Providers Cardiologist:  Manuel Mainland, MD PCP: Manuel Ferretti, DO  No chief complaint on file.     History of Present Illness: .    Manuel Richardson is a 68 y.o. male with hypertension, hyperlipidemia, prior history of A-fib,  CAD (RCA CTO), PAD, h/o left carotid to subclavian bypass, severe left subclavian stenosis, COPD, nicotine dependence, h/o amaurosis fugax.   Due to severe exertional dyspnea, and high coronary calcification, patient underwent PET/CT stress test that showed moderate size ischemia in RCA territory.  Coronary angiogram on 01/16/2024 showed RCA CTO, left-to-right collaterals from LAD.  Patient had an unsuccessful attempt at RCA CTO revascularization by Dr. Swaziland last week.***  There were no vitals filed for this visit.     ROS:  Review of Systems  Cardiovascular:  Positive for dyspnea on exertion. Negative for chest pain, leg swelling, palpitations and syncope.     Studies Reviewed: Marland Kitchen       EKG 01/05/2024: Normal sinus rhythm with sinus arrhythmia Cannot rule out Inferior infarct (cited on or before 30-Jul-2023) When compared with ECG of 10-Nov-2023 08:39, No significant change was found   Independently interpreted 01/2024: Chol 178, TG 237, HDL 49, LDL 89 Hb 12.2 Cr 1.02  07/2023: HbA1C 6.2%   Physical Exam:   Physical Exam Vitals and nursing note reviewed.  Constitutional:      General: He is not in acute distress. Neck:     Vascular: No JVD.  Cardiovascular:     Rate and Rhythm: Normal rate and regular rhythm.     Pulses:          Dorsalis pedis pulses are 1+ on the right side and 1+ on the left side.       Posterior tibial pulses are 1+ on the right side and 0 on the left side.     Heart sounds: Normal heart sounds. No murmur heard. Pulmonary:     Effort: Pulmonary effort is normal.      Breath sounds: Normal breath sounds. No wheezing or rales.  Musculoskeletal:     Right lower leg: No edema.     Left lower leg: No edema.  Skin:    Comments: Right forearm ecchymosis Intact radial and ulnar pulses      VISIT DIAGNOSES: No diagnosis found.    ASSESSMENT AND PLAN: .    Manuel Richardson is a 68 y.o. male with hypertension, hyperlipidemia, prior history of A-fib,  CAD (RCA CTO), PAD, h/o left carotid to subclavian bypass, severe left subclavian stenosis, COPD, nicotine dependence, h/o amaurosis fugax.   Exertional dyspnea, CAD: RCA territory ischemia on PET/CT stress test.  Unsuccessful revascularization attempt by Dr. Swaziland 01/2024. Exertional dyspnea likely combination of CAD as well as COPD. Recommend follow-up with pulmonology, consideration of pulmonary rehab. From cardiac standpoint, will add Ranexa 500 mg twice daily. Recommend Plavix monotherapy given CAD and PAD, okay to stop aspirin. Continue metoprolol succinate 50 mg daily, amlodipine 5 mg daily. Continue Crestor 40 mg daily. Check lipid panel in May 2025.  If LDL remains >70, he will need PCSK9 elevator or Leqvio..  I have reviewed the risks, indications, and alternatives to cardiac catheterization, possible angioplasty, and stenting with the patient. Risks include but are not limited to bleeding, infection, vascular injury, stroke, myocardial infection, arrhythmia, kidney injury, radiation-related injury in the case  of prolonged fluoroscopy use, emergency cardiac surgery, and death. The patient understands the risks of serious complication is 1-2 in 1000 with diagnostic cardiac cath and 1-2% or less with angioplasty/stenting.   Carotid/subclavian stenosis: Extensive atherosclerosis noted on CTA in 2022 with CT angiogram head/neck showed occlusion of cervical, petrous, cavernous segments of bilateral internal carotid arteries with reconstitution at the level of supraclinoid ICAs via robust PCOM bilaterally.   This seems to be unchanged compared to 04/2021.  I do not think subclavian arteries are adequately evaluated on this CT angiogram.  However, he does not have any severe symptoms related to any possibility of subclavian stenosis at this time. Continue medical management with aspirin, statin.  PAD: Prior left carotid-subclavian bypass. Occluded right iliac artery, moderate distal aorta stenosis. Pending vascular surgery consult. Continue current medical management. ***ARB  Hypertension: Continue current medications.  H/o Afib: Reported episode after inhalation of albuterol in 2022. He had no symptoms then and remains unclear if he had any recurrence since then. He wore Zio patch in 2022 through [facility] that did not show Afib. He is absolutely not in favor ir restarting Eliquis or any other anticoagulation.  Unless he has any recurrent A-fib, we would hold off initiating anticoagulation at this time.  Informed Consent   Shared Decision Making/Informed Consent The risks [stroke (1 in 1000), death (1 in 1000), kidney failure [usually temporary] (1 in 500), bleeding (1 in 200), allergic reaction [possibly serious] (1 in 200)], benefits (diagnostic support and management of coronary artery disease) and alternatives of a cardiac catheterization were discussed in detail with Manuel Richardson and he is willing to proceed.      No orders of the defined types were placed in this encounter.    F/u in 4 to 6 weeks  Signed, Manuel Negus, MD

## 2024-02-04 ENCOUNTER — Encounter: Payer: Self-pay | Admitting: Cardiology

## 2024-02-04 ENCOUNTER — Ambulatory Visit: Attending: Cardiology | Admitting: Cardiology

## 2024-02-04 ENCOUNTER — Telehealth: Payer: Self-pay

## 2024-02-04 VITALS — BP 120/70 | HR 72 | Resp 16 | Ht 68.0 in | Wt 170.0 lb

## 2024-02-04 DIAGNOSIS — I739 Peripheral vascular disease, unspecified: Secondary | ICD-10-CM

## 2024-02-04 DIAGNOSIS — E782 Mixed hyperlipidemia: Secondary | ICD-10-CM

## 2024-02-04 DIAGNOSIS — J449 Chronic obstructive pulmonary disease, unspecified: Secondary | ICD-10-CM

## 2024-02-04 DIAGNOSIS — I25118 Atherosclerotic heart disease of native coronary artery with other forms of angina pectoris: Secondary | ICD-10-CM | POA: Diagnosis not present

## 2024-02-04 MED ORDER — RANOLAZINE ER 500 MG PO TB12: 500.0000 mg | ORAL_TABLET | Freq: Two times a day (BID) | ORAL | 2 refills | Status: DC

## 2024-02-04 NOTE — Patient Instructions (Signed)
 Medication Instructions:  START Ranexa 500mg  take one tablet by mouth twice daily  *If you need a refill on your cardiac medications before your next appointment, please call your pharmacy*  Lab Work: Lipid panel in May 2025 If you have labs (blood work) drawn today and your tests are completely normal, you will receive your results only by: MyChart Message (if you have MyChart) OR A paper copy in the mail If you have any lab test that is abnormal or we need to change your treatment, we will call you to review the results.  Follow-Up: At [facility], you and your health needs are our priority.  As part of our continuing mission to provide you with exceptional heart care, our providers are all part of one team.  This team includes your primary Cardiologist (physician) and Advanced Practice Providers or APPs (Physician Assistants and Nurse Practitioners) who all work together to provide you with the care you need, when you need it.  Your next appointment:   3 month(s)  Provider:   Elder Negus, MD     We recommend signing up for the patient portal called "MyChart".  Sign up information is provided on this After Visit Summary.  MyChart is used to connect with patients for Virtual Visits (Telemedicine).  Patients are able to view lab/test results, encounter notes, upcoming appointments, etc.  Non-urgent messages can be sent to your provider as well.   To learn more about what you can do with MyChart, go to [email or web address].   Other Instructions       1st Floor: - Lobby - Registration  - Pharmacy  - Lab - Cafe  2nd Floor: - PV Lab - Diagnostic Testing (echo, CT, nuclear med)  3rd Floor: - Vacant  4th Floor: - TCTS (cardiothoracic surgery) - AFib Clinic - Structural Heart Clinic - Vascular Surgery  - Vascular Ultrasound  5th Floor: - HeartCare Cardiology (general and EP) - Clinical Pharmacy for coumadin, hypertension, lipid, weight-loss  medications, and med management appointments    Valet parking services will be available as well.

## 2024-02-04 NOTE — Telephone Encounter (Signed)
 Referral to pulmonary rehab sent.

## 2024-02-04 NOTE — Telephone Encounter (Signed)
 Mrs. Manuel Richardson is requesting referral to pulmonary rehab. She reports that [name] has completed all the other test and has seen cardio.  Please advise.    Copied from CRM [id number]. Topic: Clinical - Medical Advice >> Feb 04, 2024 11:32 AM Theodis Sato wrote: Reason for CRM: Patients sister is requesting a call back from Dr. Marcos Eke nurse to discuss next steps for pulmonary rehab for the patient.

## 2024-02-05 ENCOUNTER — Ambulatory Visit: Admitting: Surgery

## 2024-02-05 ENCOUNTER — Encounter: Payer: Self-pay | Admitting: Surgery

## 2024-02-05 ENCOUNTER — Ambulatory Visit: Payer: Self-pay | Admitting: Pulmonary Disease

## 2024-02-05 VITALS — BP 98/75 | HR 67 | Temp 98.0°F | Ht 68.0 in | Wt 169.0 lb

## 2024-02-05 DIAGNOSIS — I70213 Atherosclerosis of native arteries of extremities with intermittent claudication, bilateral legs: Secondary | ICD-10-CM | POA: Diagnosis not present

## 2024-02-05 NOTE — Telephone Encounter (Signed)
 Noted. Nothing further needed.

## 2024-02-05 NOTE — Telephone Encounter (Signed)
 I have changed the order for [facility]. They should reach out to the patient

## 2024-02-05 NOTE — Telephone Encounter (Signed)
 Patient and sister advised. He is requesting to go to [place].

## 2024-02-05 NOTE — Progress Notes (Signed)
 [facility] [place]  Patient name: Manuel Richardson MRN: [id number] DOB: 1956-03-26 Sex: male   REQUESTING PROVIDER:    Dr. Arnell Sieving   REASON FOR CONSULT:    PAD  HISTORY OF PRESENT ILLNESS:     Manuel Richardson is a 68 y.o. male, who is referred for evaluation of right leg claudication.  He has a history of left carotid subclavian bypass graft by myself in 2012 for left arm claudication which has resolved.  He states that he gets very short distance right leg claudication at approximately 30 feet.  However, most of his walking is limited by his pulmonary disease.  He states that as of now he can tolerate his current level of disability.  He does not have any rest pain or open wounds.   Patient is medically managed for hypertension with an ARB.  He has prediabetes.  He takes a statin for hypercholesterolemia.  He has a prior history of A-fib.  He is not interested in anticoagulation he has known coronary artery disease, status post unsuccessful attempt at treating a RCA occlusion he has exertional dyspnea.  He is a former smoker  PAST MEDICAL HISTORY    Past Medical History:  Diagnosis Date   COPD (chronic obstructive pulmonary disease) (HCC)    Erectile dysfunction    Full dentures    GERD (gastroesophageal reflux disease)    Hyperlipidemia    Tobacco use disorder      FAMILY HISTORY   Family History  Problem Relation Age of Onset   Heart disease Mother        died 34 with MI   Cancer Father        lung cancer   Asthma Father    Lung cancer Father        smoked   Hypertension Brother    Diabetes Neg Hx    Stroke Neg Hx     SOCIAL HISTORY:   Social History   Socioeconomic History   Marital status: Single    Spouse name: Not on file   Number of children: 2   Years of education: Not on file   Highest education level: Not on file  Occupational History   Occupation: Nurse, mental health: [employer]     Comment: HVAC, 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     Smoked 3 PPD at his heaviest  Vaping Use   Vaping status: Never Used  Substance and Sexual Activity   Alcohol use: Yes    Alcohol/week: 12.0 standard drinks of alcohol    Types: 12 Cans of beer per week    Comment: occ   Drug use: No   Sexual activity: Not on file    Comment: no exercise.  Other Topics Concern   Not on file  Social History Narrative   Married, 1 daughter in [place], exercise - walking on job   Social Drivers of Corporate investment banker Strain: Not on file  Food Insecurity: Not on file  Transportation Needs: Not on file  Physical Activity: Not  on file  Stress: Not on file  Social Connections: Not on file  Intimate Partner Violence: Not on file    ALLERGIES:    No Known Allergies  CURRENT MEDICATIONS:    Current Outpatient Medications  Medication Sig Dispense Refill   amLODipine (NORVASC) 5 MG tablet Take 1 tablet (5 mg total) by mouth daily. 90 tablet 3   aspirin EC 81 MG tablet Take 1 tablet (81 mg total) by mouth daily. Swallow whole.     BREZTRI AEROSPHERE 160-9-4.8 MCG/ACT AERO Inhale 2 puffs into the lungs 2 (two) times daily.     ezetimibe (ZETIA) 10 MG tablet Take 1 tablet (10 mg total) by mouth daily. 90 tablet 3   losartan (COZAAR) 50 MG tablet Take 1 tablet (50 mg total) by mouth daily. 90 tablet 3   metoprolol succinate (TOPROL XL) 50 MG 24 hr tablet Take 1 tablet (50 mg total) by mouth daily. Take with or immediately following a meal. 90 tablet 2   nitroGLYCERIN (NITROSTAT) 0.4 MG SL tablet Place 1 tablet (0.4 mg total) under the tongue every 5 (five) minutes as needed  for chest pain. 25 tablet 3   ranolazine (RANEXA) 500 MG 12 hr tablet Take 1 tablet (500 mg total) by mouth 2 (two) times daily. 180 tablet 2   rosuvastatin (CRESTOR) 40 MG tablet Take 1 tablet (40 mg total) by mouth daily. 90 tablet 3   No current facility-administered medications for this visit.    REVIEW OF SYSTEMS:   [X]  denotes positive finding, [ ]  denotes negative finding Cardiac  Comments:  Chest pain or chest pressure:    Shortness of breath upon exertion: x   Short of breath when lying flat:    Irregular heart rhythm:        Vascular    Pain in calf, thigh, or hip brought on by ambulation: x   Pain in feet at night that wakes you up from your sleep:     Blood clot in your veins:    Leg swelling:         Pulmonary    Oxygen at home:    Productive cough:     Wheezing:         Neurologic    Sudden weakness in arms or legs:     Sudden numbness in arms or legs:     Sudden onset of difficulty speaking or slurred speech:    Temporary loss of vision in one eye:     Problems with dizziness:         Gastrointestinal    Blood in stool:      Vomited blood:         Genitourinary    Burning when urinating:     Blood in urine:        Psychiatric    Major depression:         Hematologic    Bleeding problems:    Problems with blood clotting too easily:        Skin    Rashes or ulcers:        Constitutional    Fever or chills:     PHYSICAL EXAM:   Vitals:   02/05/24 0841  BP: 98/75  Pulse: 67  Temp: 98 F (36.7 C)  SpO2: 97%  Weight: 169 lb (76.7 kg)  Height: 5\' 8"  (1.727 m)    GENERAL: The patient is a well-nourished male, in no acute distress. The vital signs are documented  above. CARDIAC: There is a regular rate and rhythm.  VASCULAR: Nonpalpable pedal pulses PULMONARY: Nonlabored respirations ABDOMEN: Soft and non-tender  MUSCULOSKELETAL: There are no major deformities or cyanosis. NEUROLOGIC: No focal weakness or paresthesias are  detected. SKIN: There are no ulcers or rashes noted. PSYCHIATRIC: The patient has a normal affect.  STUDIES:   I have reviewed his CT scan with the following findings: 1. 3.1 cm infrarenal abdominal aortic aneurysm. Recommend follow-up ultrasound every 3 years. 2. 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 3. 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 4. 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 5. Small hiatal hernia. 6. Colonic diverticulosis.  ASSESSMENT and PLAN   PAD: The patient has right leg claudication that is tolerable.  I discussed our surgical options to address his right iliac occlusion as well as a small 3.1 cm infrarenal abdominal aortic aneurysm.  He is not a candidate for open aortic surgery because of his coronary and pulmonary disease.  His best option would be an AUI and left to right femoral-femoral bypass graft.  The main driver for this operation would be to treat his claudication which he is not interested in at this time.  He had just started medication for his lungs and would like to see how he does with that first.  He will follow-up with me in 6 months.  At that time I will get a carotid ultrasound to check on his left carotid subclavian bypass.  I will also get baseline ABIs.  He knows to contact me sooner should he develop a nonhealing wound.   Charlena Cross, MD, FACS [facility] [phone] Pager [phone]

## 2024-02-06 ENCOUNTER — Ambulatory Visit: Payer: Self-pay | Admitting: Internal Medicine

## 2024-02-06 ENCOUNTER — Other Ambulatory Visit: Payer: Self-pay | Admitting: *Deleted

## 2024-02-06 DIAGNOSIS — I6529 Occlusion and stenosis of unspecified carotid artery: Secondary | ICD-10-CM

## 2024-02-06 DIAGNOSIS — I70213 Atherosclerosis of native arteries of extremities with intermittent claudication, bilateral legs: Secondary | ICD-10-CM

## 2024-02-06 NOTE — Telephone Encounter (Signed)
 Accepted transfer from [place], Charity fundraiser.  Spoke with Lucretia Roers regarding search for [employer] contact information.  Provided DME: [employer] phone number however that was to [employer]; the patient is no longer serviced by that company.  Cordelia Pen is not physically with patient or equipment. Understands to seek immediate care for sats below 88% and/or other symptoms. She found another possible company's contact info and will attempt to reach them for assistance.    Protocol Used: Oxygen Monitoring and Hypoxia (Adult) Protocol-Based Disposition: Home Care  Positive Triage Question: * Home oxygen therapy, questions about * All higher-acuity triage questions were negative  Care Advice Discussed: * Reasons To Call Back     - You have more questions or concerns     - You become worse   ..........Marland KitchenJohna Roles MSN, RN/E2C2

## 2024-02-06 NOTE — Telephone Encounter (Signed)
 Chief Complaint: Difficulty breathing has gotten worse x1-2 months constant  Symptoms: Dizziness intermittent with standing Pertinent Negatives: Patient denies chest pain  Disposition: /[x] Urgent Care (no appt availability in office)   Additional Notes: Spoke with pt and pt's sister, Cordelia Pen. Pt is currently on 3L and wants to increase it to 4L. Pt current O2 saturation is 91%. Pt states the lowest he has seen the O2 saturation is 89%. This RN recommends pt goes to an urgent care today but pt refused. This RN recommends the company for oxygen is contacted to look at oxygen machine as pt is concerned it may not be working properly. Pt sister wants this RN to contact company but this RN explained it is a third party company. Pt daughter requests to speak with manager. This call was transferred to [facility], Charity fundraiser.   Copied from CRM [id number]. Topic: Clinical - Medical Advice >> Feb 06, 2024  4:53 PM Brennan Bailey S wrote: Reason for CRM: patient sister is asking if its okay for patient to up oxygen to 4 liters because he is still having trouble breathing,please contact patient sister, dpr, at [phone]. Reason for Disposition  [1] MILD difficulty breathing (e.g., minimal/no SOB at rest, SOB with walking, pulse <100) AND [2] NEW-onset or WORSE than normal  Answer Assessment - Initial Assessment Questions RESPIRATORY STATUS: "Describe your breathing?" (e.g., wheezing, shortness of breath, unable to speak, severe coughing)      Difficulty breathing ONSET: "When did this breathing problem begin?"      "A long time" PATTERN "Does the difficult breathing come and go, or has it been constant since it started?"      Constant SEVERITY: "How bad is your breathing?" (e.g., mild, moderate, severe)    - MILD: No SOB at rest, mild SOB with walking, speaks normally in sentences, can lie down, no retractions, pulse < 100.    - MODERATE: SOB at rest, SOB with minimal exertion and prefers to sit, cannot lie down flat, speaks in  phrases, mild retractions, audible wheezing, pulse 100-120.    - SEVERE: Very SOB at rest, speaks in single words, struggling to breathe, sitting hunched forward, retractions, pulse > 120      Mild to moderate  Protocols used: Breathing Difficulty-A-AH

## 2024-02-08 ENCOUNTER — Encounter: Payer: Self-pay | Admitting: Pulmonary Disease

## 2024-02-09 ENCOUNTER — Telehealth: Payer: Self-pay | Admitting: Pulmonary Disease

## 2024-02-09 ENCOUNTER — Ambulatory Visit: Payer: Self-pay | Admitting: Pulmonary Disease

## 2024-02-09 NOTE — Telephone Encounter (Signed)
 Chief Complaint: SOB Symptoms: SOB, increased O2 need Frequency: 3 months Pertinent Negatives: Patient denies fever, URI sx Disposition: [] ED /[] Urgent Care (no appt availability in office) / [x] Appointment(In office/virtual)/ []  [place] [facility]/ [] Home Care/ [] Refused Recommended Disposition /[]  Mobile Bus/ []  Follow-up with PCP Additional Notes: Pt sister Cordelia Pen calling on behalf of pt c/o SOB. Sister reports pt's SOB has worsened over the past 3 months and has been requiring 3L O2 more consistently. Sister endorses that he is taking Breztri as prescribed, but may not understand need for albuterol PRN. Sister unsure if this is pt new baseline, or if alternate tx needs to be considered. Triage was limited based on second-hand reporting, but triager strongly reinforced that pt call [facility] directly for further evaluation or worsening of sx that may necessitate alternative disposition. Caregiver verbalized understanding and to call back/911 with worsening symptoms.    Copied from CRM [id number]. Topic: Clinical - Red Word Triage >> Feb 09, 2024 12:26 PM Para March B wrote: [facility] that prompted transfer to Nurse Triage: Diff Breathing Reason for Disposition  [1] MODERATE longstanding difficulty breathing (e.g., speaks in phrases, SOB even at rest, pulse 100-120) AND [2] SAME as normal  Answer Assessment - Initial Assessment Questions E2C2 Pulmonary Triage - Initial Assessment Questions "Chief Complaint (e.g., cough, sob, wheezing, fever, chills, sweat or additional symptoms) *Go to specific symptom protocol after initial questions. SOB - just getting worse Reports recent heart cath -- reports one side is 100% blocked and was unable to stent  "How long have symptoms been present?" X 3 months  Have you tested for COVID or Flu? Note: If not, ask patient if a home test can be taken. If so, instruct patient to call back for positive results. No  MEDICINES:    "Have you used your  inhalers/maintenance medication?" Yes If yes, "What medications?" Breztri - 2 puffs twice a day Albuterol - does not have  If inhaler, ask "How many puffs and how often?" Note: Review instructions on medication in the chart. See above  OXYGEN: "Do you wear supplemental oxygen?" Yes If yes, "How many liters are you supposed to use?" O2 at night and PRN - reports increasing usage - sister reports 3L  "Do you monitor your oxygen levels?" Yes If yes, "What is your reading (oxygen level) today?" 88-90% x 1 last week reported by sister "I think probably 92%"  Sister reports does not check consistently  "What is your usual oxygen saturation reading?"  (Note: Pulmonary O2 sats should be 90% or greater) unknown   1. RESPIRATORY STATUS: "Describe your breathing?" (e.g., wheezing, shortness of breath, unable to speak, severe coughing)      SOB 2. ONSET: "When did this breathing problem begin?"      Worsening x 3 months 3. PATTERN "Does the difficult breathing come and go, or has it been constant since it started?"      Comes and goes, more frequently lately 4. SEVERITY: "How bad is your breathing?" (e.g., mild, moderate, severe)    - MILD: No SOB at rest, mild SOB with walking, speaks normally in sentences, can lie down, no retractions, pulse < 100.    - MODERATE: SOB at rest, SOB with minimal exertion and prefers to sit, cannot lie down flat, speaks in phrases, mild retractions, audible wheezing, pulse 100-120.    - SEVERE: Very SOB at rest, speaks in single words, struggling to breathe, sitting hunched forward, retractions, pulse > 120      Mild  5. RECURRENT SYMPTOM: "Have you had difficulty breathing before?" If Yes, ask: "When was the last time?" and "What happened that time?"      unknown 6. CARDIAC HISTORY: "Do you have any history of heart disease?" (e.g., heart attack, angina, bypass surgery, angioplasty)      Hx CAD, angina 7. LUNG HISTORY: "Do you have any history of lung  disease?"  (e.g., pulmonary embolus, asthma, emphysema)     COPD 8. CAUSE: "What do you think is causing the breathing problem?"      unknown 9. OTHER SYMPTOMS: "Do you have any other symptoms? (e.g., dizziness, runny nose, cough, chest pain, fever)     Sister reports intermittent dizziness. Denies CP, cough, syncope.         12. TRAVEL: "Have you traveled out of the country in the last month?" (e.g., travel history, exposures)       denies  Protocols used: Breathing Difficulty-A-AH

## 2024-02-09 NOTE — Telephone Encounter (Signed)
 Patient did  not start Rehab. Looks like there was some questions about what kind of rehab he needed in the referral. Patient is scheduled to see you on 4/17.

## 2024-02-09 NOTE — Telephone Encounter (Signed)
 Office is closed on 02/13/24 for Good Friday

## 2024-02-09 NOTE — Telephone Encounter (Signed)
 The rehab order was for pulmonary rehab.

## 2024-02-09 NOTE — Telephone Encounter (Signed)
 He was seen by me in January 2025 with instructions to follow in 3 to 4 weeks.  After that he has canceled MULTIPLE appointments with me.  He has been seen by cardiology and does have significant coronary artery disease as well as severe peripheral vascular disease.  His symptoms of dizziness when he stands up are likely more related to orthostatic hypotension and likely related to his circulatory issues.  I cannot advise about increasing the oxygen because they have not been able to evaluate him since January.  I recommend that he go to emergency room to be evaluated.  This appears to be a more complex problem than just pulmonary and may be more of a cardiac/pulmonary interaction issue.

## 2024-02-09 NOTE — Telephone Encounter (Signed)
 I spoke with the patient's sister Valinda Gault [facility]), she said his SOB has been going on for months and this is not anything emergent. He did just see his vascular doctor who said some of his SOB could be coming from the blockages and some from the lungs. They just want him to be seen by Dr. [name]. He is scheduled to be seen on 4/14 and they asked to be moved to Monday 4/21.  I have scheduled him an appt for 4/21 at 2:30pm.  Nothing further needed.

## 2024-02-10 NOTE — Telephone Encounter (Signed)
 Patient is scheduled to see Dr. [name] on 4/21.  Nothing further needed.

## 2024-02-11 ENCOUNTER — Encounter: Payer: Self-pay | Admitting: Surgery

## 2024-02-12 ENCOUNTER — Encounter: Admitting: Vascular Surgery

## 2024-02-12 ENCOUNTER — Ambulatory Visit: Admitting: Pulmonary Disease

## 2024-02-15 DIAGNOSIS — J439 Emphysema, unspecified: Secondary | ICD-10-CM | POA: Diagnosis not present

## 2024-02-15 DIAGNOSIS — J449 Chronic obstructive pulmonary disease, unspecified: Secondary | ICD-10-CM | POA: Diagnosis not present

## 2024-02-16 ENCOUNTER — Ambulatory Visit: Admitting: Pulmonary Disease

## 2024-02-16 ENCOUNTER — Encounter: Payer: Self-pay | Admitting: Pulmonary Disease

## 2024-02-16 VITALS — BP 130/60 | HR 69 | Temp 97.8°F | Ht 68.0 in | Wt 168.0 lb

## 2024-02-16 DIAGNOSIS — J439 Emphysema, unspecified: Secondary | ICD-10-CM | POA: Diagnosis not present

## 2024-02-16 DIAGNOSIS — J449 Chronic obstructive pulmonary disease, unspecified: Secondary | ICD-10-CM | POA: Diagnosis not present

## 2024-02-16 DIAGNOSIS — G4736 Sleep related hypoventilation in conditions classified elsewhere: Secondary | ICD-10-CM | POA: Diagnosis not present

## 2024-02-16 DIAGNOSIS — R0602 Shortness of breath: Secondary | ICD-10-CM

## 2024-02-16 MED ORDER — ALBUTEROL SULFATE HFA 108 (90 BASE) MCG/ACT IN AERS: 2.0000 | INHALATION_SPRAY | Freq: Four times a day (QID) | RESPIRATORY_TRACT | 2 refills | Status: DC | PRN

## 2024-02-16 NOTE — Patient Instructions (Signed)
 VISIT SUMMARY:  Today, we discussed your ongoing issues with emphysema and shortness of breath. We reviewed your current medications and oxygen  therapy, and we talked about the potential hereditary component of your condition. We also discussed the need for further testing and adjustments to your treatment plan.  YOUR PLAN:  -EMPHYSEMA: Emphysema is a lung condition that causes shortness of breath due to damage to the air sacs in the lungs. We will test for alpha-1 antitrypsin deficiency, a genetic condition that can worsen emphysema, to see if additional medications are needed. Please go to [facility] for this blood test.  -SHORTNESS OF BREATH: Your shortness of breath is due to both lung and heart issues. We will continue your current treatment with Breztri and enroll you in a pulmonary rehabilitation program to help with your breathing. We will also conduct a walking test to check your oxygen  levels during activity and monitor your nighttime oxygen  levels without supplemental oxygen  using a device from [employer].  INSTRUCTIONS:  Please go to [facility]  lab for the alpha-1 antitrypsin deficiency blood test. Ensure you are enrolled in a pulmonary rehabilitation program. We will conduct a walking test to assess your oxygen  levels during exertion and check your nighttime oxygen  levels without supplemental oxygen  using a device from [employer].  See you in follow-up in 6 to 8 weeks time call sooner should any new problems arise.

## 2024-02-16 NOTE — Progress Notes (Signed)
 Subjective:    Patient ID: Manuel Richardson, male    DOB: 08/26/1956, 68 y.o.   MRN: [id number]  Patient Care Team: Windell Hasty, DO as PCP - General (Internal Medicine) Cody Das, MD as PCP - Cardiology (Cardiology)  Chief Complaint  Patient presents with   Follow-up    Shortness of breath on exertion and at rest. Using oxygen  daily.     BACKGROUND/INTERVAL: Patient is a 68 year old former smoker with 100-pack-year history of smoking and a history as noted below who presents for follow-up of dyspnea on exertion and fatigue.  Initially noted in May 2022 after COVID-19 infection.  Previously evaluated by Dr. Earmon Glow in August 2023 and no follow-up since then.  Patient has been on Breztri and albuterol  for his dyspnea but does not note that this is helpful.  Recently had left heart cath due to high risk cardiac PET/CT.  He has complete occlusion of the RCA but it could not be intervened on due to noted to cross with a wire.  He is on medical therapy.  HPI Discussed the use of AI scribe software for clinical note transcription with the patient, who gave verbal consent to proceed.  History of Present Illness   Manuel Richardson is a 68 year old male with emphysema who presents with shortness of breath and medication management issues.  He experiences significant shortness of breath.  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 He is on Ranexa , 500 mg tablets taken twice a day, which started after recent cardiac cath and he believes it is helping some with his dyspnea.  He faces challenges in obtaining the medication Ohtuvayre  due to its high cost, as he does not qualify for a program to reduce the out-of-pocket expense and needs to meet a $2,000 deductible first. He is currently using Breztri but is unsure of its effectiveness, stating, 'I can't tell it's doing anything.' He needs a rescue inhaler, particularly for use when  going out.  He ran out of his albuterol  prescription.  He is not currently smoking and uses oxygen  therapy, initially set at three liters per minute. He attempted to increase it to three and a half liters but did not notice a difference.  He has not met criteria here for supplemental oxygen  during daytime.  He wears oxygen  at nighttime.  He has a family history of emphysema, as his father also had the condition. He is aware of the potential for hereditary forms of emphysema and the need for further testing to determine if he has a deficiency in alpha-1 antitrypsin.      DATA 09/21/2018 spirometry: FEV1 1.70 L or 53% predicted, FVC 2.86 L or 66% predicted, FEV1/FVC 59%.  Consistent with moderate obstruction. 05/30/2022 spirometry: FEV1 1.49 L or 46% predicted, FVC 3.07 L or 70% predicted, FEV1/FVC 48%.  Consistent with moderate to severe obstruction. 10/09/2023 echocardiogram: LVEF 55 to 60%, normal LV function, mild left ventricular hypertrophy, grade 1 DD.  RV systolic function normal.  Revealed mitral valve regurgitation.  No mitral stenosis.  No aortic valve abnormalities. 11/05/2023 PFTs: FEV1 1.57 L or 48% predicted, FVC 3.23 L or 73% of predicted, FEV1/FVC 48%, no significant bronchodilator response, volumes show air trapping and mild hyperinflation, diffusion capacity is severely decreased.  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 01/01/2024 cardiac PET/CT: EF noted to be 33 to 36%.  Study was high risk for ischemia. 01/16/2024 left heart cath: RCA  with proximal 99% stenosis followed by chronic total occlusion.  Left-to-right collaterals from LAD septal branches feeding distal RCA and beyond.  No other significant stenosis noted.  PCI could not be done at that time. 01/29/2024 coronary CTO intervention: RCA to mid RCA lesion 100% stenosed.  Unsuccessful CTO PCI of the RCA.  Unable to cross with a wire.  Review of Systems A 10 point review of systems was performed and it is as  noted above otherwise negative.   Patient Active Problem List   Diagnosis Date Noted   Angina pectoris (HCC) 01/29/2024   Pre-op evaluation 01/05/2024   Coronary artery calcification 11/10/2023   Exertional dyspnea 11/04/2023   Atrial fibrillation with RVR (HCC) 03/28/2021   COVID-19 virus infection 03/28/2021   PAD (peripheral artery disease) (HCC) 09/21/2018   Bilateral carotid bruits 09/21/2018   Tobacco use disorder 07/18/2014   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/26/2012   Coronary artery disease 05/26/2012   ED (erectile dysfunction) 03/30/2012   Primary hypertension 03/30/2012   Stage 3 severe COPD by GOLD classification (HCC) 03/30/2012   Annual physical exam 03/05/2011   Hyperlipidemia 03/05/2011   Carotid artery disease (HCC) 03/05/2011    Social History   Tobacco Use   Smoking status: Former    Current packs/day: 0.00    Average packs/day: 3.0 packs/day for 54.0 years (162.0 ttl pk-yrs)    Types: Cigarettes    Start date: 17    Quit date: 2024    Years since quitting: 1.3   Smokeless tobacco: Never   Tobacco comments:    Quit 12/28/2022 khj 09/17/2023    Smoked for 53 years.     Smoked 3 PPD at his heaviest  Substance Use Topics   Alcohol use: Yes    Alcohol/week: 12.0 standard drinks of alcohol    Types: 12 Cans of beer per week    Comment: occ    No Known Allergies  Current Meds  Medication Sig   albuterol  (VENTOLIN  HFA) 108 (90 Base) MCG/ACT inhaler Inhale 2 puffs into the lungs every 6 (six) hours as needed for wheezing or shortness of breath.   amLODipine  (NORVASC ) 5 MG tablet Take 1 tablet (5 mg total) by mouth daily.   aspirin  EC 81 MG tablet Take 1 tablet (81 mg total) by mouth daily. Swallow whole.   BREZTRI AEROSPHERE 160-9-4.8 MCG/ACT AERO Inhale 2 puffs into the lungs 2 (two) times daily.   ezetimibe  (ZETIA ) 10 MG tablet Take 1 tablet (10 mg total) by mouth daily.   losartan  (COZAAR ) 50 MG tablet Take 1 tablet (50 mg total) by mouth daily.    metoprolol  succinate (TOPROL  XL) 50 MG 24 hr tablet Take 1 tablet (50 mg total) by mouth daily. Take with or immediately following a meal.   nitroGLYCERIN  (NITROSTAT ) 0.4 MG SL tablet Place 1 tablet (0.4 mg total) under the tongue every 5 (five) minutes as needed for chest pain.   ranolazine  (RANEXA ) 500 MG 12 hr tablet Take 1 tablet (500 mg total) by mouth 2 (two) times daily.   rosuvastatin  (CRESTOR ) 40 MG tablet Take 1 tablet (40 mg total) by mouth daily.    Immunization History  Administered Date(s) Administered   Influenza,inj,Quad PF,6+ Mos 07/18/2014   Pneumococcal Polysaccharide-23 03/05/2011   Tdap 03/05/2011        Objective:     BP 130/60 (BP Location: Right Arm, Patient Position: Sitting, Cuff Size: Normal)   Pulse 69   Temp 97.8 F (36.6 C) (Temporal)  Ht 5\' 8"  (1.727 m)   Wt 168 lb (76.2 kg)   SpO2 92%   BMI 25.54 kg/m   SpO2: 92 %  GENERAL: Awake, alert, fully ambulatory.  No use of accessory use noted.  No conversational dyspnea. HEAD: Normocephalic, atraumatic.  EYES: Pupils equal, round, reactive to light.  No scleral icterus.  MOUTH: Few chipped teeth, oral mucosa moist.  Pharynx clear. NECK: Supple. No thyromegaly. Trachea midline. No JVD.  No adenopathy. PULMONARY: Distant breath sounds bilaterally.  Coarse, no adventitious sounds otherwise. CARDIOVASCULAR: S1 and S2. Regular rate and rhythm.  No rubs, murmurs or gallops heard. ABDOMEN: Mild truncal obesity. MUSCULOSKELETAL: No joint deformity, no clubbing, no edema.  NEUROLOGIC: No overt focal deficit, no gait disturbance, speech is fluent. SKIN: Intact,warm,dry. PSYCH: Mood and behavior normal  Ambulatory oxymetry was performed today:  At rest on room air oxygen  saturation was 95%, the patient ambulated at a fast pace, completed 3 laps, O2 nadir 91%, moderate shortness of breath.  Resting heart rate was 66 bpm at maximum for this exercise 92 bpm.   Assessment & Plan:     ICD-10-CM   1. 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 Nocturnal hypoxemia due to emphysema (HCC)  J43.9    G47.36     4. Shortness of breath  R06.02 AMB referral to pulmonary rehabilitation      Orders Placed This Encounter  Procedures   Alpha-1-Antitrypsin Phenotyp   AMB referral to pulmonary rehabilitation    Referral Priority:   Routine    Referral Type:   Consultation    Number of Visits Requested:   1    Meds ordered this encounter  Medications   albuterol  (VENTOLIN  HFA) 108 (90 Base) MCG/ACT inhaler    Sig: Inhale 2 puffs into the lungs every 6 (six) hours as needed for wheezing or shortness of breath.    Dispense:  8 g    Refill:  2   Discussion:    COPD/emphysema Chronic emphysema with potential hereditary component. Consideration of alpha-1 antitrypsin deficiency as a contributing factor. If deficiency is present, enzyme supplementation may be required as standard treatments may not be effective. Testing is warranted to rule out this possibility. - Order blood test for alpha-1 antitrypsin deficiency at [facility].  Shortness of breath due to lung and heart issues Chronic dyspnea attributed to both pulmonary and cardiac issues. Current treatment includes Breztri, providing some relief. Pulmonary rehabilitation should help in this situation. Oxygen  therapy is set at 3 L/min, he did not meet criteria during ambulatory oximetry. Nighttime oxygen  levels need documentation without supplemental oxygen  to maintain current therapy. - Ensure enrollment in pulmonary rehabilitation program. - Conduct a walking test to assess oxygen  levels during exertion. - Check nighttime oxygen  levels without supplemental oxygen  using a device from [employer].      Advised if symptoms do not  improve or worsen, to please contact office for sooner follow up or seek emergency care.    I spent 42 minutes of dedicated to the care of this patient on the date of this encounter to include pre-visit review of records, face-to-face time with the patient discussing conditions above, post visit ordering of testing, clinical documentation with the electronic health record, making appropriate referrals as documented, and communicating  necessary findings to members of the patients care team.     C. Chloe Counter, MD Advanced Bronchoscopy P[facility]    *This note was generated using voice recognition software/Dragon and/or AI transcription program.  Despite best efforts to proofread, errors can occur which can change the meaning. Any transcriptional errors that result from this process are unintentional and may not be fully corrected at the time of dictation.

## 2024-02-18 DIAGNOSIS — J439 Emphysema, unspecified: Secondary | ICD-10-CM | POA: Diagnosis not present

## 2024-02-18 DIAGNOSIS — J449 Chronic obstructive pulmonary disease, unspecified: Secondary | ICD-10-CM | POA: Diagnosis not present

## 2024-02-19 ENCOUNTER — Telehealth (HOSPITAL_COMMUNITY): Payer: Self-pay

## 2024-02-19 ENCOUNTER — Encounter (HOSPITAL_COMMUNITY): Payer: Self-pay

## 2024-02-19 NOTE — Telephone Encounter (Signed)
 Attempted to call patient in regards to scheduling Pulmonary Rehab - LM on VM.  Mailed letter.

## 2024-02-19 NOTE — Telephone Encounter (Signed)
 Pt insurance is active and benefits verified through [employer]. Co-pay $5, DED $0/$0 met, out of pocket $3,400/$420.66 met, co-insurance 0%.  Pre-authorization required for [id number] ONLY. 02/19/2024 @ 11:17am, spoke with Burdette Carolin, REF# [id number].

## 2024-02-19 NOTE — Telephone Encounter (Signed)
 Patient's sister called to schedule for the Pulmonary Rehab Program. Patient will come in for orientation on 04/28 and will attend the 1:15 exercise class.  Sent MyChart message.

## 2024-02-23 ENCOUNTER — Encounter (HOSPITAL_COMMUNITY)
Admission: RE | Admit: 2024-02-23 | Discharge: 2024-02-23 | Disposition: A | Discharge status: Home / Self Care | Source: Ambulatory Visit | Attending: Pulmonary Disease | Admitting: Pulmonary Disease

## 2024-02-23 ENCOUNTER — Encounter (HOSPITAL_COMMUNITY): Payer: Self-pay

## 2024-02-23 VITALS — BP 120/60 | HR 66 | Ht 68.0 in | Wt 171.1 lb

## 2024-02-23 DIAGNOSIS — J449 Chronic obstructive pulmonary disease, unspecified: Secondary | ICD-10-CM | POA: Diagnosis not present

## 2024-02-23 LAB — ALPHA-1-ANTITRYPSIN PHENOTYP: A-1 Antitrypsin: 145 mg/dL (ref 101–187)

## 2024-02-23 NOTE — Progress Notes (Signed)
 Pulmonary Individual Treatment Plan  Patient Details  Name: Manuel Richardson MRN: [id number] Date of Birth: 1956/10/08 Referring Provider:   [name] Walk Test from 02/23/2024 in [facility]  Referring Provider [name]       Initial Encounter Date:  Flowsheet Row Pulmonary Rehab Walk Test from 02/23/2024 in [facility]  Date 02/23/24       Visit Diagnosis: Stage 3 severe COPD by GOLD classification (HCC)  Patient's Home Medications on Admission:   Current Outpatient Medications:    albuterol  (VENTOLIN  HFA) 108 (90 Base) MCG/ACT inhaler, Inhale 2 puffs into the lungs every 6 (six) hours as needed for wheezing or shortness of breath., Disp: 8 g, Rfl: 2   amLODipine  (NORVASC ) 5 MG tablet, Take 1 tablet (5 mg total) by mouth daily., Disp: 90 tablet, Rfl: 3   aspirin  EC 81 MG tablet, Take 1 tablet (81 mg total) by mouth daily. Swallow whole., Disp: , Rfl:    BREZTRI AEROSPHERE 160-9-4.8 MCG/ACT AERO, Inhale 2 puffs into the lungs 2 (two) times daily., Disp: , Rfl:    ezetimibe  (ZETIA ) 10 MG tablet, Take 1 tablet (10 mg total) by mouth daily., Disp: 90 tablet, Rfl: 3   losartan  (COZAAR ) 50 MG tablet, Take 1 tablet (50 mg total) by mouth daily., Disp: 90 tablet, Rfl: 3   metoprolol  succinate (TOPROL  XL) 50 MG 24 hr tablet, Take 1 tablet (50 mg total) by mouth daily. Take with or immediately following a meal., Disp: 90 tablet, Rfl: 2   nitroGLYCERIN  (NITROSTAT ) 0.4 MG SL tablet, Place 1 tablet (0.4 mg total) under the tongue every 5 (five) minutes as needed for chest pain., Disp: 25 tablet, Rfl: 3   ranolazine  (RANEXA ) 500 MG 12 hr tablet, Take 1 tablet (500 mg total) by mouth 2 (two) times daily., Disp: 180 tablet, Rfl: 2   rosuvastatin  (CRESTOR ) 40 MG tablet, Take 1 tablet (40 mg total) by mouth daily., Disp: 90 tablet, Rfl: 3  Past Medical  History: Past Medical History:  Diagnosis Date   COPD (chronic obstructive pulmonary disease) (HCC)    Erectile dysfunction    Full dentures    GERD (gastroesophageal reflux disease)    Hyperlipidemia    Tobacco use disorder     Tobacco Use: Social History   Tobacco Use  Smoking Status Former   Current packs/day: 0.00   Average packs/day: 3.0 packs/day for 54.0 years (162.0 ttl pk-yrs)   Types: Cigarettes   Start date: 52   Quit date: 2024   Years since quitting: 1.3  Smokeless Tobacco Never  Tobacco Comments   Quit 12/28/2022 khj 09/17/2023   Smoked for 53 years.    Smoked 3 PPD at his heaviest    Labs: Review Flowsheet  More data exists      Latest Ref Rng & Units 03/20/2011 03/30/2012 04/01/2021 06/11/2023 01/05/2024  Labs for ITP Cardiac and Pulmonary Rehab  Cholestrol 100 - 199 mg/dL - 272  - 536  644   LDL (calc) 0 - 99 mg/dL - 034  - 86  89   HDL-C >39 mg/dL - 52  - 51  49   Trlycerides 0 - 149 mg/dL - 742  - 98  595   Hemoglobin A1c 4.8 - 5.6 % 5.8  5.9  6.7  - -    Capillary Blood Glucose: Lab Results  Component Value Date  GLUCAP 291 (H) 04/01/2021     Pulmonary Assessment Scores:  Pulmonary Assessment Scores     Row Name 02/23/24 1332         ADL UCSD   ADL Phase Entry     SOB Score total 53       CAT Score   CAT Score 15       mMRC Score   mMRC Score 4             UCSD: Self-administered rating of dyspnea associated with activities of daily living (ADLs) 6-point scale (0 = "not at all" to 5 = "maximal or unable to do because of breathlessness")  Scoring Scores range from 0 to 120.  Minimally important difference is 5 units  CAT: CAT can identify the health impairment of COPD patients and is better correlated with disease progression.  CAT has a scoring range of zero to 40. The CAT score is classified into four groups of low (less than 10), medium (10 - 20), high (21-30) and very high (31-40) based on the impact level of disease on  health status. A CAT score over 10 suggests significant symptoms.  A worsening CAT score could be explained by an exacerbation, poor medication adherence, poor inhaler technique, or progression of COPD or comorbid conditions.  CAT MCID is 2 points  mMRC: mMRC (Modified Medical Research Council) Dyspnea Scale is used to assess the degree of baseline functional disability in patients of respiratory disease due to dyspnea. No minimal important difference is established. A decrease in score of 1 point or greater is considered a positive change.   Pulmonary Function Assessment:  Pulmonary Function Assessment - 02/23/24 1514       Breath   Bilateral Breath Sounds Decreased             Exercise Target Goals: Exercise Program Goal: Individual exercise prescription set using results from initial 6 min walk test and THRR while considering  patient's activity barriers and safety.   Exercise Prescription Goal: Initial exercise prescription builds to 30-45 minutes a day of aerobic activity, 2-3 days per week.  Home exercise guidelines will be given to patient during program as part of exercise prescription that the participant will acknowledge.  Activity Barriers & Risk Stratification:  Activity Barriers & Cardiac Risk Stratification - 02/23/24 1353       Activity Barriers & Cardiac Risk Stratification   Activity Barriers Deconditioning;Muscular Weakness;Shortness of Breath    Cardiac Risk Stratification High             6 Minute Walk:  6 Minute Walk     Row Name 02/23/24 1435         6 Minute Walk   Phase Initial     Distance 840 feet     Walk Time 6 minutes     # of Rest Breaks 1  claudication at 4 min, rest 3:58-6:00     MPH 1.59     METS 2.5     RPE 11     Perceived Dyspnea  2     VO2 Peak 8.76     Symptoms Yes (comment)     Comments had to rest due to claudication. Also needed 1L after 4 min     Resting HR 66 bpm     Resting BP 120/60     Resting Oxygen   Saturation  93 %     Exercise Oxygen  Saturation  during 6 min walk 86 %  Max Ex. HR 102 bpm     Max Ex. BP 146/70     2 Minute Post BP 138/64       Interval HR   1 Minute HR 75     2 Minute HR 91     3 Minute HR 97     4 Minute HR 102     5 Minute HR 91     6 Minute HR 76     2 Minute Post HR 56     Interval Heart Rate? Yes       Interval Oxygen    Interval Oxygen ? Yes     Baseline Oxygen  Saturation % 93 %     1 Minute Oxygen  Saturation % 94 %     1 Minute Liters of Oxygen  0 L     2 Minute Oxygen  Saturation % 90 %     2 Minute Liters of Oxygen  0 L     3 Minute Oxygen  Saturation % 88 %     3 Minute Liters of Oxygen  0 L     4 Minute Oxygen  Saturation % 87 %     4 Minute Liters of Oxygen  0 L     5 Minute Oxygen  Saturation % 89 %     5 Minute Liters of Oxygen  1 L  SpO2 down to 86 RA, applied 1L     6 Minute Oxygen  Saturation % 96 %     6 Minute Liters of Oxygen  1 L     2 Minute Post Oxygen  Saturation % 99 %     2 Minute Post Liters of Oxygen  1 L              Oxygen  Initial Assessment:  Oxygen  Initial Assessment - 02/23/24 1350       Home Oxygen    Home Oxygen  Device Home Concentrator;E-Tanks    Sleep Oxygen  Prescription Continuous    Liters per minute 3    Home Exercise Oxygen  Prescription None    Home Resting Oxygen  Prescription None    Compliance with Home Oxygen  Use Yes      Initial 6 min Walk   Oxygen  Used Continuous    Liters per minute 1      Program Oxygen  Prescription   Program Oxygen  Prescription Continuous    Liters per minute 1      Intervention   Short Term Goals To learn and exhibit compliance with exercise, home and travel O2 prescription;To learn and understand importance of maintaining oxygen  saturations>88%;To learn and demonstrate proper use of respiratory medications;To learn and understand importance of monitoring SPO2 with pulse oximeter and demonstrate accurate use of the pulse oximeter.;To learn and demonstrate proper pursed lip  breathing techniques or other breathing techniques. ;Other    Long  Term Goals Demonstrates proper use of MDI's;Compliance with respiratory medication;Other;Exhibits proper breathing techniques, such as pursed lip breathing or other method taught during program session;Maintenance of O2 saturations>88%;Verbalizes importance of monitoring SPO2 with pulse oximeter and return demonstration;Exhibits compliance with exercise, home  and travel O2 prescription             Oxygen  Re-Evaluation:   Oxygen  Discharge (Final Oxygen  Re-Evaluation):   Initial Exercise Prescription:  Initial Exercise Prescription - 02/23/24 1500       Date of Initial Exercise RX and Referring Provider   Date 02/23/24    Referring Provider [name]    Expected Discharge Date 05/20/24      Oxygen    Oxygen  Continuous    Liters  1    Maintain Oxygen  Saturation 88% or higher      NuStep   Level 2    SPM 70    Minutes 15    METs 2.5      Track   Laps 8    Minutes 15    METs 2.5      Prescription Details   Frequency (times per week) 2    Duration Progress to 30 minutes of continuous aerobic without signs/symptoms of physical distress      Intensity   THRR 40-80% of Max Heartrate 61-122    Ratings of Perceived Exertion 11-13    Perceived Dyspnea 0-4      Progression   Progression Continue to progress workloads to maintain intensity without signs/symptoms of physical distress.      Resistance Training   Training Prescription Yes    Weight black bands    Reps 10-15             Perform Capillary Blood Glucose checks as needed.  Exercise Prescription Changes:   Exercise Comments:   Exercise Goals and Review:   Exercise Goals     Row Name 02/23/24 1334             Exercise Goals   Increase Physical Activity Yes       Intervention Provide advice, education, support and counseling about physical activity/exercise needs.;Develop an individualized exercise prescription for aerobic and  resistive training based on initial evaluation findings, risk stratification, comorbidities and participant's personal goals.       Expected Outcomes Short Term: Attend rehab on a regular basis to increase amount of physical activity.;Long Term: Add in home exercise to make exercise part of routine and to increase amount of physical activity.;Long Term: Exercising regularly at least 3-5 days a week.       Increase Strength and Stamina Yes       Intervention Provide advice, education, support and counseling about physical activity/exercise needs.;Develop an individualized exercise prescription for aerobic and resistive training based on initial evaluation findings, risk stratification, comorbidities and participant's personal goals.       Expected Outcomes Short Term: Increase workloads from initial exercise prescription for resistance, speed, and METs.;Short Term: Perform resistance training exercises routinely during rehab and add in resistance training at home;Long Term: Improve cardiorespiratory fitness, muscular endurance and strength as measured by increased METs and functional capacity ( )       Able to understand and use rate of perceived exertion (RPE) scale Yes       Intervention Provide education and explanation on how to use RPE scale       Expected Outcomes Short Term: Able to use RPE daily in rehab to express subjective intensity level;Long Term:  Able to use RPE to guide intensity level when exercising independently       Able to understand and use Dyspnea scale Yes       Intervention Provide education and explanation on how to use Dyspnea scale       Expected Outcomes Short Term: Able to use Dyspnea scale daily in rehab to express subjective sense of shortness of breath during exertion;Long Term: Able to use Dyspnea scale to guide intensity level when exercising independently       Knowledge and understanding of Target Heart Rate Range (THRR) Yes       Intervention Provide education and  explanation of THRR including how the numbers were predicted and where they are located for reference  Expected Outcomes Short Term: Able to state/look up THRR;Long Term: Able to use THRR to govern intensity when exercising independently;Short Term: Able to use daily as guideline for intensity in rehab       Understanding of Exercise Prescription Yes       Intervention Provide education, explanation, and written materials on patient's individual exercise prescription       Expected Outcomes Short Term: Able to explain program exercise prescription;Long Term: Able to explain home exercise prescription to exercise independently                Exercise Goals Re-Evaluation :   Discharge Exercise Prescription (Final Exercise Prescription Changes):   Nutrition:  Target Goals: Understanding of nutrition guidelines, daily intake of sodium 1500mg , cholesterol 200mg , calories 30% from fat and 7% or less from saturated fats, daily to have 5 or more servings of fruits and vegetables.  Biometrics:    Nutrition Therapy Plan and Nutrition Goals:   Nutrition Assessments:  MEDIFICTS Score Key: >=70 Need to make dietary changes  40-70 Heart Healthy Diet <= 40 Therapeutic Level Cholesterol Diet   Picture Your Plate Scores: <74 Unhealthy dietary pattern with much room for improvement. 41-50 Dietary pattern unlikely to meet recommendations for good health and room for improvement. 51-60 More healthful dietary pattern, with some room for improvement.  >60 Healthy dietary pattern, although there may be some specific behaviors that could be improved.    Nutrition Goals Re-Evaluation:   Nutrition Goals Discharge (Final Nutrition Goals Re-Evaluation):   Psychosocial: Target Goals: Acknowledge presence or absence of significant depression and/or stress, maximize coping skills, provide positive support system. Participant is able to verbalize types and ability to use techniques and  skills needed for reducing stress and depression.  Initial Review & Psychosocial Screening:  Initial Psych Review & Screening - 02/23/24 1333       Family Dynamics   Good Support System? Yes    Comments sister Valinda Gault      Barriers   Psychosocial barriers to participate in program The patient should benefit from training in stress management and relaxation.      Screening Interventions   Interventions Encouraged to exercise    Expected Outcomes Long Term goal: The participant improves quality of Life and PHQ9 Scores as seen by post scores and/or verbalization of changes;Short Term goal: Identification and review with participant of any Quality of Life or Depression concerns found by scoring the questionnaire.;Long Term Goal: Stressors or current issues are controlled or eliminated.;Short Term goal: Utilizing psychosocial counselor, staff and physician to assist with identification of specific Stressors or current issues interfering with healing process. Setting desired goal for each stressor or current issue identified.             Quality of Life Scores:  Scores of 19 and below usually indicate a poorer quality of life in these areas.  A difference of  2-3 points is a clinically meaningful difference.  A difference of 2-3 points in the total score of the Quality of Life Index has been associated with significant improvement in overall quality of life, self-image, physical symptoms, and general health in studies assessing change in quality of life.  PHQ-9: Review Flowsheet       02/23/2024 09/21/2018  Depression screen PHQ 2/9  Decreased Interest 0 0  Down, Depressed, Hopeless 0 0  PHQ - 2 Score 0 0  Altered sleeping 0 -  Tired, decreased energy 2 -  Change in appetite 0 -  Feeling  bad or failure about yourself  0 -  Trouble concentrating 0 -  Moving slowly or fidgety/restless 0 -  Suicidal thoughts 0 -  PHQ-9 Score 2 -  Difficult doing work/chores Somewhat difficult -    Interpretation of Total Score  Total Score Depression Severity:  1-4 = Minimal depression, 5-9 = Mild depression, 10-14 = Moderate depression, 15-19 = Moderately severe depression, 20-27 = Severe depression   Psychosocial Evaluation and Intervention:  Psychosocial Evaluation - 02/23/24 1348       Psychosocial Evaluation & Interventions   Interventions Encouraged to exercise with the program and follow exercise prescription    Expected Outcomes For pt to participate in PR    Continue Psychosocial Services  Follow up required by staff             Psychosocial Re-Evaluation:   Psychosocial Discharge (Final Psychosocial Re-Evaluation):   Education: Education Goals: Education classes will be provided on a weekly basis, covering required topics. Participant will state understanding/return demonstration of topics presented.  Learning Barriers/Preferences:  Learning Barriers/Preferences - 02/23/24 1349       Learning Barriers/Preferences   Learning Barriers None    Learning Preferences Skilled Demonstration;Group Instruction             Education Topics: Know Your Numbers Group instruction that is supported by a PowerPoint presentation. Instructor discusses importance of knowing and understanding resting, exercise, and post-exercise oxygen  saturation, heart rate, and blood pressure. Oxygen  saturation, heart rate, blood pressure, rating of perceived exertion, and dyspnea are reviewed along with a normal range for these values.    Exercise for the Pulmonary Patient Group instruction that is supported by a PowerPoint presentation. Instructor discusses benefits of exercise, core components of exercise, frequency, duration, and intensity of an exercise routine, importance of utilizing pulse oximetry during exercise, safety while exercising, and options of places to exercise outside of rehab.    MET Level  Group instruction provided by PowerPoint, verbal discussion, and  written material to support subject matter. Instructor reviews what METs are and how to increase METs.    Pulmonary Medications Verbally interactive group education provided by instructor with focus on inhaled medications and proper administration.   Anatomy and Physiology of the Respiratory System Group instruction provided by PowerPoint, verbal discussion, and written material to support subject matter. Instructor reviews respiratory cycle and anatomical components of the respiratory system and their functions. Instructor also reviews differences in obstructive and restrictive respiratory diseases with examples of each.    Oxygen  Safety Group instruction provided by PowerPoint, verbal discussion, and written material to support subject matter. There is an overview of "What is Oxygen " and "Why do we need it".  Instructor also reviews how to create a safe environment for oxygen  use, the importance of using oxygen  as prescribed, and the risks of noncompliance. There is a brief discussion on traveling with oxygen  and resources the patient may utilize.   Oxygen  Use Group instruction provided by PowerPoint, verbal discussion, and written material to discuss how supplemental oxygen  is prescribed and different types of oxygen  supply systems. Resources for more information are provided.    Breathing Techniques Group instruction that is supported by demonstration and informational handouts. Instructor discusses the benefits of pursed lip and diaphragmatic breathing and detailed demonstration on how to perform both.     Risk Factor Reduction Group instruction that is supported by a PowerPoint presentation. Instructor discusses the definition of a risk factor, different risk factors for pulmonary disease, and how the heart  and lungs work together.   Pulmonary Diseases Group instruction provided by PowerPoint, verbal discussion, and written material to support subject matter. Instructor gives an  overview of the different type of pulmonary diseases. There is also a discussion on risk factors and symptoms as well as ways to manage the diseases.   Stress and Energy Conservation Group instruction provided by PowerPoint, verbal discussion, and written material to support subject matter. Instructor gives an overview of stress and the impact it can have on the body. Instructor also reviews ways to reduce stress. There is also a discussion on energy conservation and ways to conserve energy throughout the day.   Warning Signs and Symptoms Group instruction provided by PowerPoint, verbal discussion, and written material to support subject matter. Instructor reviews warning signs and symptoms of stroke, heart attack, cold and flu. Instructor also reviews ways to prevent the spread of infection.   Other Education Group or individual verbal, written, or video instructions that support the educational goals of the pulmonary rehab program.    Knowledge Questionnaire Score:  Knowledge Questionnaire Score - 02/23/24 1344       Knowledge Questionnaire Score   Pre Score 15/18             Core Components/Risk Factors/Patient Goals at Admission:  Personal Goals and Risk Factors at Admission - 02/23/24 1334       Core Components/Risk Factors/Patient Goals on Admission   Improve shortness of breath with ADL's Yes    Intervention Provide education, individualized exercise plan and daily activity instruction to help decrease symptoms of SOB with activities of daily living.    Expected Outcomes Short Term: Improve cardiorespiratory fitness to achieve a reduction of symptoms when performing ADLs;Long Term: Be able to perform more ADLs without symptoms or delay the onset of symptoms             Core Components/Risk Factors/Patient Goals Review:    Core Components/Risk Factors/Patient Goals at Discharge (Final Review):    ITP Comments:   Comments: Dr. Genetta Kenning is Medical Director  for Pulmonary Rehab at [facility].

## 2024-02-23 NOTE — Progress Notes (Signed)
 Manuel Richardson 68 y.o. male Pulmonary Rehab Orientation Note This patient who was referred to Pulmonary Rehab by Dr. [name] with the diagnosis of COPD 3 arrived today in Cardiac and Pulmonary Rehab. He arrived ambulatory with normal gait. He does not carry portable oxygen . [employer] is the provider for their DME. Per patient, Mccabe uses oxygen  occasionally during the day and at night when going to sleep.   Color good, skin warm and dry. Patient is oriented to time and place. Patient's medical history, psychosocial health, and medications reviewed. Psychosocial assessment reveals patient lives alone. Errin is currently retired. Patient hobbies include watching tv. Patient reports his stress level is low. Areas of stress/anxiety include family . Patient does not exhibit signs of depression. PHQ2/9 score 0/2. Dontai shows good  coping skills with positive outlook on life. Offered emotional support and reassurance. Will continue to monitor and evaluate progress toward psychosocial goal(s) of decreasing stress.   Physical assessment performed by [name] RN. Please see their orientation physical assessment note. Jaquill reports he  does take medications as prescribed. Patient states he  follows a regular  diet. The patient reports no specific efforts to gain or lose weight.. Patient's weight will be monitored closely.   Demonstration and practice of PLB using pulse oximeter. Jakorey able to return demonstration satisfactorily. Safety and hand hygiene in the exercise area reviewed with patient. Crit voices understanding of the information reviewed. Department expectations discussed with patient and achievable goals were set. The patient shows enthusiasm about attending the program and we look forward to working with [facility]. Cara completed a 6 min walk test today and is scheduled to begin exercise on 03/02/24 at 1 pm.   [id number] Douglass Gelineau, BS, ACSM-CEP

## 2024-02-23 NOTE — Progress Notes (Signed)
 Manuel Richardson 68 y.o. male  Initial Psychosocial Assessment  Pt psychosocial assessment reveals pt lives alone. Pt is currently retired. Pt hobbies include watching tv. Pt reports his  stress level is low. Areas of stress/anxiety include family .  Pt does not exhibit signs of depression. Pt shows good  coping skills with positive outlook . Offered emotional support and reassurance. Monitor and evaluate progress toward psychosocial goal(s).  Goal(s): Improved management of stress Improved coping skills Help patient work toward returning to meaningful activities that improve patient's QOL and are attainable with patient's lung disease German Koller BS, ACSM-CEP 02/23/2024 3:21 PM   02/23/2024 3:20 PM

## 2024-02-24 DIAGNOSIS — R0902 Hypoxemia: Secondary | ICD-10-CM | POA: Diagnosis not present

## 2024-02-24 DIAGNOSIS — G4730 Sleep apnea, unspecified: Secondary | ICD-10-CM | POA: Diagnosis not present

## 2024-03-01 ENCOUNTER — Other Ambulatory Visit: Payer: Self-pay | Admitting: Pulmonary Disease

## 2024-03-01 ENCOUNTER — Telehealth: Payer: Self-pay | Admitting: Acute Care

## 2024-03-01 ENCOUNTER — Encounter: Payer: Self-pay | Admitting: Pulmonary Disease

## 2024-03-01 ENCOUNTER — Telehealth: Payer: Self-pay

## 2024-03-01 NOTE — Telephone Encounter (Signed)
 Overnight Pulse-Oximetry report  Test started on 02/24/2024 ended on 02/25/2024.  Per Dr. [name] patient to continue 2L nocturnal oxygen .   Patient's sister Valinda Gault advised. DRP checked. NFN.

## 2024-03-01 NOTE — Telephone Encounter (Signed)
 Patient is due for a 6 month follow up scan in 03/2024. Called and spoke with pt's sister (she left VM for us ). She states the pt is not willing to have the follow up scan at this time and would like to push it back several months. Called and spoke with pt regarding his follow up scan. We spoke in detail about importance of staying up-to-date with scanning per radiologist recommendations. We dicussed early detection lung cancer and treatment variations with time. Patient verbalized understanding but still requests we defer his follow up scan till September. This has been noted in our [facility] dashboard to defer until 06/2024 per pt's request.

## 2024-03-02 ENCOUNTER — Encounter (HOSPITAL_COMMUNITY)
Admission: RE | Admit: 2024-03-02 | Discharge: 2024-03-02 | Disposition: A | Discharge status: Home / Self Care | Source: Ambulatory Visit | Attending: Pulmonary Disease | Admitting: Pulmonary Disease

## 2024-03-02 DIAGNOSIS — J449 Chronic obstructive pulmonary disease, unspecified: Secondary | ICD-10-CM | POA: Diagnosis not present

## 2024-03-02 NOTE — Progress Notes (Signed)
 Daily Session Note  Patient Details  Name: Manuel Richardson MRN: [id number] Date of Birth: 1956-04-30 Referring Provider:   [name] Walk Test from 02/23/2024 in [facility]  Referring Provider [name]       Encounter Date: 03/02/2024  Check In:  Session Check In - 03/02/24 1321       Check-In   Supervising physician immediately available to respond to emergencies [facility] MD immediately available    Physician(s) Theresia Flasher, NP    Location [facility]    Staff Present [name], RN, BSN;Randi Regis Captain BS, ACSM-CEP, Exercise Physiologist;Samantha Belarus, RD, Alaine Howells, MS, ACSM-CEP, Exercise Physiologist;Asier Desroches Ena Harries, MS, Exercise Physiologist    Virtual Visit No    Medication changes reported     No    Fall or balance concerns reported    No    Tobacco Cessation No Change    Warm-up and Cool-down Performed as group-led instruction   orientation only   Resistance Training Performed Yes    VAD Patient? No    PAD/SET Patient? No      Pain Assessment   Currently in Pain? No/denies    Multiple Pain Sites No             Capillary Blood Glucose: No results found for this or any previous visit (from the past 24 hours).    Social History   Tobacco Use  Smoking Status Former   Current packs/day: 0.00   Average packs/day: 3.0 packs/day for 54.0 years (162.0 ttl pk-yrs)   Types: Cigarettes   Start date: 34   Quit date: 2024   Years since quitting: 1.3  Smokeless Tobacco Never  Tobacco Comments   Quit 12/28/2022 khj 09/17/2023   Smoked for 53 years.    Smoked 3 PPD at his heaviest    Goals Met:  Proper associated with RPD/PD & O2 Sat Independence with exercise equipment Exercise tolerated well No report of concerns or symptoms today Strength training completed today  Goals Unmet:  Not Applicable  Comments: Service time is from 1308 to  1341.    Dr. Genetta Kenning is Medical Director for Pulmonary Rehab at [facility].

## 2024-03-04 ENCOUNTER — Encounter (HOSPITAL_COMMUNITY)
Admission: RE | Admit: 2024-03-04 | Discharge: 2024-03-04 | Disposition: A | Discharge status: Home / Self Care | Source: Ambulatory Visit | Attending: Pulmonary Disease | Admitting: Pulmonary Disease

## 2024-03-04 VITALS — Wt 169.1 lb

## 2024-03-04 DIAGNOSIS — J449 Chronic obstructive pulmonary disease, unspecified: Secondary | ICD-10-CM | POA: Diagnosis not present

## 2024-03-04 NOTE — Progress Notes (Deleted)
 Daily Session Note  Patient Details  Name: Manuel Richardson MRN: [id number] Date of Birth: 1956/02/25 Referring Provider:   [name] Walk Test from 02/23/2024 in [facility]  Referring Provider [name]       Encounter Date: 03/04/2024  Check In:  Session Check In - 03/04/24 1329       Check-In   Supervising physician immediately available to respond to emergencies [facility] MD immediately available    Physician(s) Levin Reamer, NP    Location [facility]    Staff Present [name], RN, BSN;Randi Regis Captain BS, ACSM-CEP, Exercise Physiologist;Kaylee Nolon Baxter, MS, ACSM-CEP, Exercise Physiologist;Casey Felipe Horton, RT    Virtual Visit No    Medication changes reported     No    Fall or balance concerns reported    No    Tobacco Cessation No Change    Warm-up and Cool-down Performed as group-led instruction   orientation only   Resistance Training Performed Yes    VAD Patient? No    PAD/SET Patient? No      Pain Assessment   Currently in Pain? No/denies    Multiple Pain Sites No             Capillary Blood Glucose: No results found for this or any previous visit (from the past 24 hours).    Social History   Tobacco Use  Smoking Status Former   Current packs/day: 0.00   Average packs/day: 3.0 packs/day for 54.0 years (162.0 ttl pk-yrs)   Types: Cigarettes   Start date: 2   Quit date: 2024   Years since quitting: 1.3  Smokeless Tobacco Never  Tobacco Comments   Quit 12/28/2022 khj 09/17/2023   Smoked for 53 years.    Smoked 3 PPD at his heaviest    Goals Met:  Exercise tolerated well No report of concerns or symptoms today Strength training completed today  Goals Unmet:  Not Applicable  Comments: Service time is from 1308 to 83    Dr. Genetta Kenning is Medical Director for Pulmonary Rehab at [facility].

## 2024-03-04 NOTE — Progress Notes (Signed)
 Daily Session Note  Patient Details  Name: Manuel Richardson MRN: [id number] Date of Birth: [birth year] Referring Provider:   [name] Walk Test from 02/23/2024 in [facility]  Referring Provider [name]       Encounter Date: 03/04/2024  Check In:  Session Check In - 03/04/24 1329       Check-In   Supervising physician immediately available to respond to emergencies [facility] MD immediately available    Physician(s) Levin Reamer, NP    Location [facility]    Staff Present [name], RN, BSN;Randi Regis Captain BS, ACSM-CEP, Exercise Physiologist;Landra Howze Nolon Baxter, MS, ACSM-CEP, Exercise Physiologist;Casey Felipe Horton, RT    Virtual Visit No    Medication changes reported     No    Fall or balance concerns reported    No    Tobacco Cessation No Change    Warm-up and Cool-down Performed as group-led instruction   orientation only   Resistance Training Performed Yes    VAD Patient? No    PAD/SET Patient? No      Pain Assessment   Currently in Pain? No/denies    Multiple Pain Sites No             Capillary Blood Glucose: No results found for this or any previous visit (from the past 24 hours).    Social History   Tobacco Use  Smoking Status Former   Current packs/day: 0.00   Average packs/day: 3.0 packs/day for 54.0 years (162.0 ttl pk-yrs)   Types: Cigarettes   Start date: 9   Quit date: 2024   Years since quitting: 1.3  Smokeless Tobacco Never  Tobacco Comments   Quit 12/28/2022 khj 09/17/2023   Smoked for 53 years.    Smoked 3 PPD at his heaviest    Goals Met:  Proper associated with RPD/PD & O2 Sat Exercise tolerated well No report of concerns or symptoms today Strength training completed today  Goals Unmet:  Not Applicable  Comments: Service time is from 1308 to 1443.    Dr. Genetta Kenning is Medical Director for Pulmonary Rehab at [facility].

## 2024-03-09 ENCOUNTER — Encounter (HOSPITAL_COMMUNITY)
Admission: RE | Admit: 2024-03-09 | Discharge: 2024-03-09 | Disposition: A | Discharge status: Home / Self Care | Source: Ambulatory Visit | Attending: Pulmonary Disease | Admitting: Pulmonary Disease

## 2024-03-09 DIAGNOSIS — J449 Chronic obstructive pulmonary disease, unspecified: Secondary | ICD-10-CM

## 2024-03-10 NOTE — Progress Notes (Signed)
 Pulmonary Individual Treatment Plan  Patient Details  Name: Manuel Richardson MRN: [id number] Date of Birth: April 11, 1956 Referring Provider:   [name] Walk Test from 02/23/2024 in [facility]  Referring Provider [name]       Initial Encounter Date:  Flowsheet Row Pulmonary Rehab Walk Test from 02/23/2024 in [facility]  Date 02/23/24       Visit Diagnosis: Stage 3 severe COPD by GOLD classification (HCC)  Patient's Home Medications on Admission:   Current Outpatient Medications:    albuterol  (VENTOLIN  HFA) 108 (90 Base) MCG/ACT inhaler, Inhale 2 puffs into the lungs every 6 (six) hours as needed for wheezing or shortness of breath., Disp: 8 g, Rfl: 2   amLODipine  (NORVASC ) 5 MG tablet, Take 1 tablet (5 mg total) by mouth daily., Disp: 90 tablet, Rfl: 3   aspirin  EC 81 MG tablet, Take 1 tablet (81 mg total) by mouth daily. Swallow whole., Disp: , Rfl:    BREZTRI AEROSPHERE 160-9-4.8 MCG/ACT AERO, Inhale 2 puffs into the lungs 2 (two) times daily., Disp: , Rfl:    ezetimibe  (ZETIA ) 10 MG tablet, Take 1 tablet (10 mg total) by mouth daily., Disp: 90 tablet, Rfl: 3   losartan  (COZAAR ) 50 MG tablet, Take 1 tablet (50 mg total) by mouth daily., Disp: 90 tablet, Rfl: 3   metoprolol  succinate (TOPROL  XL) 50 MG 24 hr tablet, Take 1 tablet (50 mg total) by mouth daily. Take with or immediately following a meal., Disp: 90 tablet, Rfl: 2   nitroGLYCERIN  (NITROSTAT ) 0.4 MG SL tablet, Place 1 tablet (0.4 mg total) under the tongue every 5 (five) minutes as needed for chest pain., Disp: 25 tablet, Rfl: 3   ranolazine  (RANEXA ) 500 MG 12 hr tablet, Take 1 tablet (500 mg total) by mouth 2 (two) times daily., Disp: 180 tablet, Rfl: 2   rosuvastatin  (CRESTOR ) 40 MG tablet, Take 1 tablet (40 mg total) by mouth daily., Disp: 90 tablet, Rfl: 3  Past Medical  History: Past Medical History:  Diagnosis Date   COPD (chronic obstructive pulmonary disease) (HCC)    Erectile dysfunction    Full dentures    GERD (gastroesophageal reflux disease)    Hyperlipidemia    Tobacco use disorder     Tobacco Use: Social History   Tobacco Use  Smoking Status Former   Current packs/day: 0.00   Average packs/day: 3.0 packs/day for 54.0 years (162.0 ttl pk-yrs)   Types: Cigarettes   Start date: 71   Quit date: 2024   Years since quitting: 1.3  Smokeless Tobacco Never  Tobacco Comments   Quit 12/28/2022 khj 09/17/2023   Smoked for 53 years.    Smoked 3 PPD at his heaviest    Labs: Review Flowsheet  More data exists      Latest Ref Rng & Units 03/20/2011 03/30/2012 04/01/2021 06/11/2023 01/05/2024  Labs for ITP Cardiac and Pulmonary Rehab  Cholestrol 100 - 199 mg/dL - 119  - 147  829   LDL (calc) 0 - 99 mg/dL - 562  - 86  89   HDL-C >39 mg/dL - 52  - 51  49   Trlycerides 0 - 149 mg/dL - 130  - 98  865   Hemoglobin A1c 4.8 - 5.6 % 5.8  5.9  6.7  - -    Capillary Blood Glucose: Lab Results  Component Value Date  GLUCAP 291 (H) 04/01/2021     Pulmonary Assessment Scores:  Pulmonary Assessment Scores     Row Name 02/23/24 1332         ADL UCSD   ADL Phase Entry     SOB Score total 53       CAT Score   CAT Score 15       mMRC Score   mMRC Score 4             UCSD: Self-administered rating of dyspnea associated with activities of daily living (ADLs) 6-point scale (0 = "not at all" to 5 = "maximal or unable to do because of breathlessness")  Scoring Scores range from 0 to 120.  Minimally important difference is 5 units  CAT: CAT can identify the health impairment of COPD patients and is better correlated with disease progression.  CAT has a scoring range of zero to 40. The CAT score is classified into four groups of low (less than 10), medium (10 - 20), high (21-30) and very high (31-40) based on the impact level of disease on  health status. A CAT score over 10 suggests significant symptoms.  A worsening CAT score could be explained by an exacerbation, poor medication adherence, poor inhaler technique, or progression of COPD or comorbid conditions.  CAT MCID is 2 points  mMRC: mMRC (Modified Medical Research Council) Dyspnea Scale is used to assess the degree of baseline functional disability in patients of respiratory disease due to dyspnea. No minimal important difference is established. A decrease in score of 1 point or greater is considered a positive change.   Pulmonary Function Assessment:  Pulmonary Function Assessment - 02/23/24 1514       Breath   Bilateral Breath Sounds Decreased             Exercise Target Goals: Exercise Program Goal: Individual exercise prescription set using results from initial 6 min walk test and THRR while considering  patient's activity barriers and safety.   Exercise Prescription Goal: Initial exercise prescription builds to 30-45 minutes a day of aerobic activity, 2-3 days per week.  Home exercise guidelines will be given to patient during program as part of exercise prescription that the participant will acknowledge.  Activity Barriers & Risk Stratification:  Activity Barriers & Cardiac Risk Stratification - 02/23/24 1353       Activity Barriers & Cardiac Risk Stratification   Activity Barriers Deconditioning;Muscular Weakness;Shortness of Breath    Cardiac Risk Stratification High             6 Minute Walk:  6 Minute Walk     Row Name 02/23/24 1435         6 Minute Walk   Phase Initial     Distance 840 feet     Walk Time 6 minutes     # of Rest Breaks 1  claudication at 4 min, rest 3:58-6:00     MPH 1.59     METS 2.5     RPE 11     Perceived Dyspnea  2     VO2 Peak 8.76     Symptoms Yes (comment)     Comments had to rest due to claudication. Also needed 1L after 4 min     Resting HR 66 bpm     Resting BP 120/60     Resting Oxygen   Saturation  93 %     Exercise Oxygen  Saturation  during 6 min walk 86 %  Max Ex. HR 102 bpm     Max Ex. BP 146/70     2 Minute Post BP 138/64       Interval HR   1 Minute HR 75     2 Minute HR 91     3 Minute HR 97     4 Minute HR 102     5 Minute HR 91     6 Minute HR 76     2 Minute Post HR 56     Interval Heart Rate? Yes       Interval Oxygen    Interval Oxygen ? Yes     Baseline Oxygen  Saturation % 93 %     1 Minute Oxygen  Saturation % 94 %     1 Minute Liters of Oxygen  0 L     2 Minute Oxygen  Saturation % 90 %     2 Minute Liters of Oxygen  0 L     3 Minute Oxygen  Saturation % 88 %     3 Minute Liters of Oxygen  0 L     4 Minute Oxygen  Saturation % 87 %     4 Minute Liters of Oxygen  0 L     5 Minute Oxygen  Saturation % 89 %     5 Minute Liters of Oxygen  1 L  SpO2 down to 86 RA, applied 1L     6 Minute Oxygen  Saturation % 96 %     6 Minute Liters of Oxygen  1 L     2 Minute Post Oxygen  Saturation % 99 %     2 Minute Post Liters of Oxygen  1 L              Oxygen  Initial Assessment:  Oxygen  Initial Assessment - 02/23/24 1350       Home Oxygen    Home Oxygen  Device Home Concentrator;E-Tanks    Sleep Oxygen  Prescription Continuous    Liters per minute 3    Home Exercise Oxygen  Prescription None    Home Resting Oxygen  Prescription None    Compliance with Home Oxygen  Use Yes      Initial 6 min Walk   Oxygen  Used Continuous    Liters per minute 1      Program Oxygen  Prescription   Program Oxygen  Prescription Continuous    Liters per minute 1      Intervention   Short Term Goals To learn and exhibit compliance with exercise, home and travel O2 prescription;To learn and understand importance of maintaining oxygen  saturations>88%;To learn and demonstrate proper use of respiratory medications;To learn and understand importance of monitoring SPO2 with pulse oximeter and demonstrate accurate use of the pulse oximeter.;To learn and demonstrate proper pursed lip  breathing techniques or other breathing techniques. ;Other    Long  Term Goals Demonstrates proper use of MDI's;Compliance with respiratory medication;Other;Exhibits proper breathing techniques, such as pursed lip breathing or other method taught during program session;Maintenance of O2 saturations>88%;Verbalizes importance of monitoring SPO2 with pulse oximeter and return demonstration;Exhibits compliance with exercise, home  and travel O2 prescription             Oxygen  Re-Evaluation:  Oxygen  Re-Evaluation     Row Name 03/02/24 0837             Program Oxygen  Prescription   Program Oxygen  Prescription Continuous       Liters per minute 1         Home Oxygen    Home Oxygen  Device Home Concentrator;E-Tanks  Sleep Oxygen  Prescription Continuous       Liters per minute 3       Home Exercise Oxygen  Prescription None       Home Resting Oxygen  Prescription None       Compliance with Home Oxygen  Use Yes         Goals/Expected Outcomes   Short Term Goals To learn and exhibit compliance with exercise, home and travel O2 prescription;To learn and understand importance of maintaining oxygen  saturations>88%;To learn and demonstrate proper use of respiratory medications;To learn and understand importance of monitoring SPO2 with pulse oximeter and demonstrate accurate use of the pulse oximeter.;To learn and demonstrate proper pursed lip breathing techniques or other breathing techniques. ;Other       Long  Term Goals Demonstrates proper use of MDI's;Compliance with respiratory medication;Other;Exhibits proper breathing techniques, such as pursed lip breathing or other method taught during program session;Maintenance of O2 saturations>88%;Verbalizes importance of monitoring SPO2 with pulse oximeter and return demonstration;Exhibits compliance with exercise, home  and travel O2 prescription       Goals/Expected Outcomes Compliance and understanding of oxygen  saturation monitoring and breath  techniques to decrease shortness of breath.                Oxygen  Discharge (Final Oxygen  Re-Evaluation):  Oxygen  Re-Evaluation - 03/02/24 0837       Program Oxygen  Prescription   Program Oxygen  Prescription Continuous    Liters per minute 1      Home Oxygen    Home Oxygen  Device Home Concentrator;E-Tanks    Sleep Oxygen  Prescription Continuous    Liters per minute 3    Home Exercise Oxygen  Prescription None    Home Resting Oxygen  Prescription None    Compliance with Home Oxygen  Use Yes      Goals/Expected Outcomes   Short Term Goals To learn and exhibit compliance with exercise, home and travel O2 prescription;To learn and understand importance of maintaining oxygen  saturations>88%;To learn and demonstrate proper use of respiratory medications;To learn and understand importance of monitoring SPO2 with pulse oximeter and demonstrate accurate use of the pulse oximeter.;To learn and demonstrate proper pursed lip breathing techniques or other breathing techniques. ;Other    Long  Term Goals Demonstrates proper use of MDI's;Compliance with respiratory medication;Other;Exhibits proper breathing techniques, such as pursed lip breathing or other method taught during program session;Maintenance of O2 saturations>88%;Verbalizes importance of monitoring SPO2 with pulse oximeter and return demonstration;Exhibits compliance with exercise, home  and travel O2 prescription    Goals/Expected Outcomes Compliance and understanding of oxygen  saturation monitoring and breath techniques to decrease shortness of breath.             Initial Exercise Prescription:  Initial Exercise Prescription - 02/23/24 1500       Date of Initial Exercise RX and Referring Provider   Date 02/23/24    Referring Provider [name]    Expected Discharge Date 05/20/24      Oxygen    Oxygen  Continuous    Liters 1    Maintain Oxygen  Saturation 88% or higher      NuStep   Level 2    SPM 70    Minutes 15    METs  2.5      Track   Laps 8    Minutes 15    METs 2.5      Prescription Details   Frequency (times per week) 2    Duration Progress to 30 minutes of continuous aerobic without signs/symptoms of physical distress  Intensity   THRR 40-80% of Max Heartrate 61-122    Ratings of Perceived Exertion 11-13    Perceived Dyspnea 0-4      Progression   Progression Continue to progress workloads to maintain intensity without signs/symptoms of physical distress.      Resistance Training   Training Prescription Yes    Weight black bands    Reps 10-15             Perform Capillary Blood Glucose checks as needed.  Exercise Prescription Changes:   Exercise Prescription Changes     Row Name 03/04/24 1151             Response to Exercise   Blood Pressure (Admit) 124/60       Blood Pressure (Exit) 112/68       Heart Rate (Admit) 69 bpm       Heart Rate (Exercise) 97 bpm       Heart Rate (Exit) 82 bpm       Oxygen  Saturation (Admit) 96 %       Oxygen  Saturation (Exercise) 95 %       Oxygen  Saturation (Exit) 98 %       Rating of Perceived Exertion (Exercise) 11       Perceived Dyspnea (Exercise) 1       Duration Progress to 30 minutes of  aerobic without signs/symptoms of physical distress       Intensity THRR unchanged         Progression   Progression Continue to progress workloads to maintain intensity without signs/symptoms of physical distress.       Average METs 2.7         Resistance Training   Training Prescription Yes       Weight black bands       Reps 10-15       Time 10 Minutes         Oxygen    Oxygen  Continuous       Liters 3         NuStep   Level 2       SPM 89       Minutes 15       METs 2.7         Track   Laps 8       Minutes 15       METs 2.23         Oxygen    Maintain Oxygen  Saturation 88% or higher                Exercise Comments:   Exercise Goals and Review:   Exercise Goals     Row Name 02/23/24 1334              Exercise Goals   Increase Physical Activity Yes       Intervention Provide advice, education, support and counseling about physical activity/exercise needs.;Develop an individualized exercise prescription for aerobic and resistive training based on initial evaluation findings, risk stratification, comorbidities and participant's personal goals.       Expected Outcomes Short Term: Attend rehab on a regular basis to increase amount of physical activity.;Long Term: Add in home exercise to make exercise part of routine and to increase amount of physical activity.;Long Term: Exercising regularly at least 3-5 days a week.       Increase Strength and Stamina Yes       Intervention Provide advice, education, support and counseling about physical activity/exercise needs.;Develop an  individualized exercise prescription for aerobic and resistive training based on initial evaluation findings, risk stratification, comorbidities and participant's personal goals.       Expected Outcomes Short Term: Increase workloads from initial exercise prescription for resistance, speed, and METs.;Short Term: Perform resistance training exercises routinely during rehab and add in resistance training at home;Long Term: Improve cardiorespiratory fitness, muscular endurance and strength as measured by increased METs and functional capacity ( )       Able to understand and use rate of perceived exertion (RPE) scale Yes       Intervention Provide education and explanation on how to use RPE scale       Expected Outcomes Short Term: Able to use RPE daily in rehab to express subjective intensity level;Long Term:  Able to use RPE to guide intensity level when exercising independently       Able to understand and use Dyspnea scale Yes       Intervention Provide education and explanation on how to use Dyspnea scale       Expected Outcomes Short Term: Able to use Dyspnea scale daily in rehab to express subjective sense of shortness of breath  during exertion;Long Term: Able to use Dyspnea scale to guide intensity level when exercising independently       Knowledge and understanding of Target Heart Rate Range (THRR) Yes       Intervention Provide education and explanation of THRR including how the numbers were predicted and where they are located for reference       Expected Outcomes Short Term: Able to state/look up THRR;Long Term: Able to use THRR to govern intensity when exercising independently;Short Term: Able to use daily as guideline for intensity in rehab       Understanding of Exercise Prescription Yes       Intervention Provide education, explanation, and written materials on patient's individual exercise prescription       Expected Outcomes Short Term: Able to explain program exercise prescription;Long Term: Able to explain home exercise prescription to exercise independently                Exercise Goals Re-Evaluation :  Exercise Goals Re-Evaluation     Row Name 03/02/24 0847             Exercise Goal Re-Evaluation   Exercise Goals Review Increase Physical Activity;Able to understand and use Dyspnea scale;Understanding of Exercise Prescription;Increase Strength and Stamina;Knowledge and understanding of Target Heart Rate Range (THRR);Able to understand and use rate of perceived exertion (RPE) scale       Comments Pt is scheduled to begin exercise 5/6. Will progress as tolerated.       Expected Outcomes Through exercise in rehab and at home, the patient will decrease shortness of breath with daily activities and feel confident in doing an exercise regimen at home.                Discharge Exercise Prescription (Final Exercise Prescription Changes):  Exercise Prescription Changes - 03/04/24 1151       Response to Exercise   Blood Pressure (Admit) 124/60    Blood Pressure (Exit) 112/68    Heart Rate (Admit) 69 bpm    Heart Rate (Exercise) 97 bpm    Heart Rate (Exit) 82 bpm    Oxygen  Saturation (Admit)  96 %    Oxygen  Saturation (Exercise) 95 %    Oxygen  Saturation (Exit) 98 %    Rating of Perceived Exertion (Exercise) 11    Perceived  Dyspnea (Exercise) 1    Duration Progress to 30 minutes of  aerobic without signs/symptoms of physical distress    Intensity THRR unchanged      Progression   Progression Continue to progress workloads to maintain intensity without signs/symptoms of physical distress.    Average METs 2.7      Resistance Training   Training Prescription Yes    Weight black bands    Reps 10-15    Time 10 Minutes      Oxygen    Oxygen  Continuous    Liters 3      NuStep   Level 2    SPM 89    Minutes 15    METs 2.7      Track   Laps 8    Minutes 15    METs 2.23      Oxygen    Maintain Oxygen  Saturation 88% or higher             Nutrition:  Target Goals: Understanding of nutrition guidelines, daily intake of sodium 1500mg , cholesterol 200mg , calories 30% from fat and 7% or less from saturated fats, daily to have 5 or more servings of fruits and vegetables.  Biometrics:    Nutrition Therapy Plan and Nutrition Goals:  Nutrition Therapy & Goals - 03/04/24 1358       Nutrition Therapy   Diet Heart Healthy Diet    Drug/Food Interactions Statins/Certain Fruits      Personal Nutrition Goals   Nutrition Goal Patient to improve diet quality by using the plate method as a guide for meal planning to include lean protein/plant protein, fruits, vegetables, whole grains, nonfat dairy as part of a well-balanced diet.    Comments Patient has medical history of hyperlipidemia, CAD, HTN, PAD, COPD3. Triglycerides remain elevated, A1c is in a prediabetic range. Patient will benefit from participation in pulmonary rehab for nutrition, exercise, and lifestyle modification.      Intervention Plan   Intervention Prescribe, educate and counsel regarding individualized specific dietary modifications aiming towards targeted core components such as weight, hypertension,  lipid management, diabetes, heart failure and other comorbidities.;Nutrition handout(s) given to patient.    Expected Outcomes Short Term Goal: Understand basic principles of dietary content, such as calories, fat, sodium, cholesterol and nutrients.;Long Term Goal: Adherence to prescribed nutrition plan.             Nutrition Assessments:  MEDIFICTS Score Key: >=70 Need to make dietary changes  40-70 Heart Healthy Diet <= 40 Therapeutic Level Cholesterol Diet   Picture Your Plate Scores: <16 Unhealthy dietary pattern with much room for improvement. 41-50 Dietary pattern unlikely to meet recommendations for good health and room for improvement. 51-60 More healthful dietary pattern, with some room for improvement.  >60 Healthy dietary pattern, although there may be some specific behaviors that could be improved.    Nutrition Goals Re-Evaluation:  Nutrition Goals Re-Evaluation     Row Name 03/04/24 1358             Goals   Current Weight 170 lb 6.7 oz (77.3 kg)       Comment triglycerides 237, LDL 89, HDL 49, A1c 6.2       Expected Outcome Patient has medical history of hyperlipidemia, CAD, HTN, PAD, COPD3. Triglycerides remain elevated, A1c is in a prediabetic range. Patient will benefit from participation in pulmonary rehab for nutrition, exercise, and lifestyle modification.                Nutrition Goals  Discharge (Final Nutrition Goals Re-Evaluation):  Nutrition Goals Re-Evaluation - 03/04/24 1358       Goals   Current Weight 170 lb 6.7 oz (77.3 kg)    Comment triglycerides 237, LDL 89, HDL 49, A1c 6.2    Expected Outcome Patient has medical history of hyperlipidemia, CAD, HTN, PAD, COPD3. Triglycerides remain elevated, A1c is in a prediabetic range. Patient will benefit from participation in pulmonary rehab for nutrition, exercise, and lifestyle modification.             Psychosocial: Target Goals: Acknowledge presence or absence of significant  depression and/or stress, maximize coping skills, provide positive support system. Participant is able to verbalize types and ability to use techniques and skills needed for reducing stress and depression.  Initial Review & Psychosocial Screening:  Initial Psych Review & Screening - 02/23/24 1333       Family Dynamics   Good Support System? Yes    Comments sister Valinda Gault      Barriers   Psychosocial barriers to participate in program The patient should benefit from training in stress management and relaxation.      Screening Interventions   Interventions Encouraged to exercise    Expected Outcomes Long Term goal: The participant improves quality of Life and PHQ9 Scores as seen by post scores and/or verbalization of changes;Short Term goal: Identification and review with participant of any Quality of Life or Depression concerns found by scoring the questionnaire.;Long Term Goal: Stressors or current issues are controlled or eliminated.;Short Term goal: Utilizing psychosocial counselor, staff and physician to assist with identification of specific Stressors or current issues interfering with healing process. Setting desired goal for each stressor or current issue identified.             Quality of Life Scores:  Scores of 19 and below usually indicate a poorer quality of life in these areas.  A difference of  2-3 points is a clinically meaningful difference.  A difference of 2-3 points in the total score of the Quality of Life Index has been associated with significant improvement in overall quality of life, self-image, physical symptoms, and general health in studies assessing change in quality of life.  PHQ-9: Review Flowsheet       02/23/2024 09/21/2018  Depression screen PHQ 2/9  Decreased Interest 0 0  Down, Depressed, Hopeless 0 0  PHQ - 2 Score 0 0  Altered sleeping 0 -  Tired, decreased energy 2 -  Change in appetite 0 -  Feeling bad or failure about yourself  0 -  Trouble  concentrating 0 -  Moving slowly or fidgety/restless 0 -  Suicidal thoughts 0 -  PHQ-9 Score 2 -  Difficult doing work/chores Somewhat difficult -   Interpretation of Total Score  Total Score Depression Severity:  1-4 = Minimal depression, 5-9 = Mild depression, 10-14 = Moderate depression, 15-19 = Moderately severe depression, 20-27 = Severe depression   Psychosocial Evaluation and Intervention:  Psychosocial Evaluation - 02/23/24 1348       Psychosocial Evaluation & Interventions   Interventions Encouraged to exercise with the program and follow exercise prescription    Expected Outcomes For pt to participate in PR    Continue Psychosocial Services  Follow up required by staff             Psychosocial Re-Evaluation:  Psychosocial Re-Evaluation     Row Name 03/04/24 0915             Psychosocial Re-Evaluation  Current issues with None Identified       Comments Psychosocial monthly re-evaluation is as follows: Christop has attended 1 session so far. He denies any new psy/soc concerns. He declines any additional resources or referrals at this time.       Expected Outcomes For Jamesedward to continue to be free of any psy/soc barriers or concerns.       Interventions Encouraged to attend Pulmonary Rehabilitation for the exercise       Continue Psychosocial Services  No Follow up required                Psychosocial Discharge (Final Psychosocial Re-Evaluation):  Psychosocial Re-Evaluation - 03/04/24 0915       Psychosocial Re-Evaluation   Current issues with None Identified    Comments Psychosocial monthly re-evaluation is as follows: Welch has attended 1 session so far. He denies any new psy/soc concerns. He declines any additional resources or referrals at this time.    Expected Outcomes For Abdurahman to continue to be free of any psy/soc barriers or concerns.    Interventions Encouraged to attend Pulmonary Rehabilitation for the exercise    Continue Psychosocial Services   No Follow up required             Education: Education Goals: Education classes will be provided on a weekly basis, covering required topics. Participant will state understanding/return demonstration of topics presented.  Learning Barriers/Preferences:  Learning Barriers/Preferences - 02/23/24 1349       Learning Barriers/Preferences   Learning Barriers None    Learning Preferences Skilled Demonstration;Group Instruction             Education Topics: Know Your Numbers Group instruction that is supported by a PowerPoint presentation. Instructor discusses importance of knowing and understanding resting, exercise, and post-exercise oxygen  saturation, heart rate, and blood pressure. Oxygen  saturation, heart rate, blood pressure, rating of perceived exertion, and dyspnea are reviewed along with a normal range for these values.    Exercise for the Pulmonary Patient Group instruction that is supported by a PowerPoint presentation. Instructor discusses benefits of exercise, core components of exercise, frequency, duration, and intensity of an exercise routine, importance of utilizing pulse oximetry during exercise, safety while exercising, and options of places to exercise outside of rehab.    MET Level  Group instruction provided by PowerPoint, verbal discussion, and written material to support subject matter. Instructor reviews what METs are and how to increase METs.    Pulmonary Medications Verbally interactive group education provided by instructor with focus on inhaled medications and proper administration.   Anatomy and Physiology of the Respiratory System Group instruction provided by PowerPoint, verbal discussion, and written material to support subject matter. Instructor reviews respiratory cycle and anatomical components of the respiratory system and their functions. Instructor also reviews differences in obstructive and restrictive respiratory diseases with examples of  each.    Oxygen  Safety Group instruction provided by PowerPoint, verbal discussion, and written material to support subject matter. There is an overview of "What is Oxygen " and "Why do we need it".  Instructor also reviews how to create a safe environment for oxygen  use, the importance of using oxygen  as prescribed, and the risks of noncompliance. There is a brief discussion on traveling with oxygen  and resources the patient may utilize.   Oxygen  Use Group instruction provided by PowerPoint, verbal discussion, and written material to discuss how supplemental oxygen  is prescribed and different types of oxygen  supply systems. Resources for more information are provided.  Breathing Techniques Group instruction that is supported by demonstration and informational handouts. Instructor discusses the benefits of pursed lip and diaphragmatic breathing and detailed demonstration on how to perform both.     Risk Factor Reduction Group instruction that is supported by a PowerPoint presentation. Instructor discusses the definition of a risk factor, different risk factors for pulmonary disease, and how the heart and lungs work together.   Pulmonary Diseases Group instruction provided by PowerPoint, verbal discussion, and written material to support subject matter. Instructor gives an overview of the different type of pulmonary diseases. There is also a discussion on risk factors and symptoms as well as ways to manage the diseases.   Stress and Energy Conservation Group instruction provided by PowerPoint, verbal discussion, and written material to support subject matter. Instructor gives an overview of stress and the impact it can have on the body. Instructor also reviews ways to reduce stress. There is also a discussion on energy conservation and ways to conserve energy throughout the day.   Warning Signs and Symptoms Group instruction provided by PowerPoint, verbal discussion, and written material  to support subject matter. Instructor reviews warning signs and symptoms of stroke, heart attack, cold and flu. Instructor also reviews ways to prevent the spread of infection. Flowsheet Row PULMONARY REHAB CHRONIC OBSTRUCTIVE PULMONARY DISEASE from 03/04/2024 in [facility] for Heart, Vascular, & Lung Health  Date 03/04/24  Educator RN  Instruction Review Code 1- Verbalizes Understanding       Other Education Group or individual verbal, written, or video instructions that support the educational goals of the pulmonary rehab program.    Knowledge Questionnaire Score:  Knowledge Questionnaire Score - 02/23/24 1344       Knowledge Questionnaire Score   Pre Score 15/18             Core Components/Risk Factors/Patient Goals at Admission:  Personal Goals and Risk Factors at Admission - 02/23/24 1334       Core Components/Risk Factors/Patient Goals on Admission   Improve shortness of breath with ADL's Yes    Intervention Provide education, individualized exercise plan and daily activity instruction to help decrease symptoms of SOB with activities of daily living.    Expected Outcomes Short Term: Improve cardiorespiratory fitness to achieve a reduction of symptoms when performing ADLs;Long Term: Be able to perform more ADLs without symptoms or delay the onset of symptoms             Core Components/Risk Factors/Patient Goals Review:   Goals and Risk Factor Review     Row Name 03/04/24 0917             Core Components/Risk Factors/Patient Goals Review   Personal Goals Review Improve shortness of breath with ADL's;Develop more efficient breathing techniques such as purse lipped breathing and diaphragmatic breathing and practicing self-pacing with activity.       Review Monthly review of patient's Core Components/Risk Factors/Patient Goals are as follows: Unable to assess progress. Courtez has attended 1 class so far.       Expected Outcomes Pt will show  progress toward meeting expected goals and outcomes.                Core Components/Risk Factors/Patient Goals at Discharge (Final Review):   Goals and Risk Factor Review - 03/04/24 0917       Core Components/Risk Factors/Patient Goals Review   Personal Goals Review Improve shortness of breath with ADL's;Develop more efficient breathing techniques such as purse  lipped breathing and diaphragmatic breathing and practicing self-pacing with activity.    Review Monthly review of patient's Core Components/Risk Factors/Patient Goals are as follows: Unable to assess progress. Allie has attended 1 class so far.    Expected Outcomes Pt will show progress toward meeting expected goals and outcomes.             ITP Comments: Pt is making expected progress toward Pulmonary Rehab goals after completing 2 session(s). Recommend continued exercise, life style modification, education, and utilization of breathing techniques to increase stamina and strength, while also decreasing shortness of breath with exertion.     Comments: Dr. Genetta Kenning is Medical Director for Pulmonary Rehab at [facility].

## 2024-03-11 ENCOUNTER — Encounter (HOSPITAL_COMMUNITY)

## 2024-03-15 ENCOUNTER — Telehealth: Payer: Self-pay | Admitting: Cardiology

## 2024-03-15 NOTE — Telephone Encounter (Signed)
 Wife is calling to see why the inhaler was given to the patient for his surgery. Please advise

## 2024-03-15 NOTE — Telephone Encounter (Signed)
 Spoke to pt's sister. She states pt was billed $462.00 from 4/3-4/4. The following medications are billed for the dates above but was not covered because it is a self administered medication.   $247.00Fluticasone  furoate-vilanterol Umecidinium bromide    I can not see in patient orders/charts.  Will get message to Dr. Swaziland for clarification.  Pt's sister would like the call back, not the pt. Number provided: [phone]

## 2024-03-15 NOTE — Telephone Encounter (Signed)
 Spoke to patient's sister Valinda Gault Dr.Jordan's advice given.

## 2024-03-16 ENCOUNTER — Telehealth (HOSPITAL_COMMUNITY): Payer: Self-pay | Admitting: *Deleted

## 2024-03-16 ENCOUNTER — Encounter (HOSPITAL_COMMUNITY)
Admission: RE | Admit: 2024-03-16 | Discharge: 2024-03-16 | Disposition: A | Discharge status: Home / Self Care | Source: Ambulatory Visit | Attending: Pulmonary Disease | Admitting: Pulmonary Disease

## 2024-03-16 ENCOUNTER — Telehealth: Payer: Self-pay | Admitting: *Deleted

## 2024-03-16 DIAGNOSIS — J449 Chronic obstructive pulmonary disease, unspecified: Secondary | ICD-10-CM | POA: Diagnosis not present

## 2024-03-16 DIAGNOSIS — J439 Emphysema, unspecified: Secondary | ICD-10-CM | POA: Diagnosis not present

## 2024-03-16 NOTE — Telephone Encounter (Signed)
 Informed pt and sister that the elevator in [place] is inoperable and that he will need to take the stairs.  German Koller BS, ACSM-CEP 03/16/2024 9:56 AM

## 2024-03-16 NOTE — Progress Notes (Signed)
 Daily Session Note  Patient Details  Name: Manuel Richardson MRN: [id number] Date of Birth: June 08, 1956 Referring Provider:   [name] Walk Test from 02/23/2024 in [facility] for Heart, Vascular, & Lung Health  Referring Provider [name]       Encounter Date: 03/16/2024  Check In:  Session Check In - 03/16/24 1405       Check-In   Supervising physician immediately available to respond to emergencies [facility] MD immediately available    Physician(s) Palmer Bobo, NP    Location [facility]    Staff Present [name], RN, BSN;Randi Regis Captain BS, ACSM-CEP, Exercise Physiologist;Davi Kroon Nolon Baxter, MS, ACSM-CEP, Exercise Physiologist;Casey Vernadine Golas Belarus, RD, LDN    Virtual Visit No    Medication changes reported     No    Fall or balance concerns reported    No    Tobacco Cessation No Change    Warm-up and Cool-down Performed as group-led instruction   orientation only   Resistance Training Performed Yes    VAD Patient? No    PAD/SET Patient? No      Pain Assessment   Currently in Pain? No/denies    Multiple Pain Sites No             Capillary Blood Glucose: No results found for this or any previous visit (from the past 24 hours).    Social History   Tobacco Use  Smoking Status Former   Current packs/day: 0.00   Average packs/day: 3.0 packs/day for 54.0 years (162.0 ttl pk-yrs)   Types: Cigarettes   Start date: 68   Quit date: 2024   Years since quitting: 1.3  Smokeless Tobacco Never  Tobacco Comments   Quit 12/28/2022 khj 09/17/2023   Smoked for 53 years.    Smoked 3 PPD at his heaviest    Goals Met:  Proper associated with RPD/PD & O2 Sat Exercise tolerated well No report of concerns or symptoms today Strength training completed today  Goals Unmet:  Not Applicable  Comments: Service time is from 1303 to 1439.    Dr. Genetta Kenning is Medical Director for Pulmonary Rehab at [facility].

## 2024-03-16 NOTE — Telephone Encounter (Signed)
 Copied from CRM [id number]. Topic: Clinical - Medication Question >> Mar 16, 2024  9:43 AM Isabell A wrote: Reason for CRM: Patient sister Napolean Backbone [phone]) states he was prescribed an inhaler while he was in the hospital (4/1-4/4), she wants clarification why these were prescribed.   Fluticasone -Furoate-Vilanterol  Umeclidinium bromide 

## 2024-03-18 ENCOUNTER — Encounter (HOSPITAL_COMMUNITY)
Admission: RE | Admit: 2024-03-18 | Discharge: 2024-03-18 | Disposition: A | Discharge status: Home / Self Care | Source: Ambulatory Visit | Attending: Pulmonary Disease | Admitting: Pulmonary Disease

## 2024-03-18 DIAGNOSIS — J449 Chronic obstructive pulmonary disease, unspecified: Secondary | ICD-10-CM

## 2024-03-18 NOTE — Telephone Encounter (Signed)
 Copied from CRM [id number]. Topic: Clinical - Medication Question >> Mar 16, 2024  9:43 AM Isabell A wrote: Reason for CRM: Patient sister Manuel Richardson [phone]) states he was prescribed an inhaler while he was in the hospital (4/1-4/4), she wants clarification why these were prescribed.   Fluticasone -Furoate-Vilanterol  Umeclidinium bromide  >> Mar 18, 2024  4:06 PM Margarette Shawl wrote: Pt's sister is contacting clinic back, due to not getting return call from her message left on 05/20. She is reporting the previous medications were prescribed during the pt's hospital stay; however, pt states he was not given them. He is now being charged for the medications. Sherri has spoke with Billing, who advised to contact the physician who prescribed medication. She reached out to cardiologist he was seen by, Dr. Peter Swaziland, who advised Dr. [name] was prescribing provider.   Requests call back  [phone]

## 2024-03-18 NOTE — Telephone Encounter (Signed)
 Spoke with patient's sister who was trying to identify who had ordered and authorized two inhalers that were supposedly given to Manuel Richardson during his hospitalization.   The inhalers were: Incruse Ellipta , and Breo Ellipta . They were given 01/29/24, Ordering Provider: Electa Grieve, RPH-CPP and Authorizing Provider: [place], Peter M, MD -- per chart, and both medications were discontinued upon discharge.   Sister is frustrated since she does not recall [name] ever receiving these medication and is now being charged for them and the bill is quite high. Too, she has spoken with Dr. Christophe Cram office who she says, has claimed that Dr. [name] was the authorizer for those medications. Dr. [name] was not involved in these medical decisions at the time.   Sister reports she will continue to search for who she needs to talk to get these medications removed and is highly flustered with this experience.

## 2024-03-18 NOTE — Progress Notes (Signed)
 Daily Session Note  Patient Details  Name: Manuel Richardson MRN: [id number] Date of Birth: [birth year] Referring Provider:   [name] Walk Test from 02/23/2024 in [facility] for Heart, Vascular, & Lung Health  Referring Provider [name]       Encounter Date: 03/18/2024  Check In:  Session Check In - 03/18/24 1320       Check-In   Supervising physician immediately available to respond to emergencies [facility] MD immediately available    Physician(s) Charles Connor, NP    [facility]    Staff Present [name], RN, BSN;Randi Regis Captain BS, ACSM-CEP, Exercise Physiologist;Kaylee Nolon Baxter, MS, ACSM-CEP, Exercise Physiologist;Artelia Game Felipe Horton, RT    Virtual Visit No    Medication changes reported     No    Fall or balance concerns reported    No    Tobacco Cessation No Change    Warm-up and Cool-down Performed as group-led instruction   orientation only   Resistance Training Performed Yes    VAD Patient? No    PAD/SET Patient? No      Pain Assessment   Currently in Pain? No/denies    Multiple Pain Sites No             Capillary Blood Glucose: No results found for this or any previous visit (from the past 24 hours).    Social History   Tobacco Use  Smoking Status Former   Current packs/day: 0.00   Average packs/day: 3.0 packs/day for 54.0 years (162.0 ttl pk-yrs)   Types: Cigarettes   Start date: 78   Quit date: 2024   Years since quitting: 1.3  Smokeless Tobacco Never  Tobacco Comments   Quit 12/28/2022 khj 09/17/2023   Smoked for 53 years.    Smoked 3 PPD at his heaviest    Goals Met:  Proper associated with RPD/PD & O2 Sat Independence with exercise equipment Exercise tolerated well No report of concerns or symptoms today Strength training completed today  Goals Unmet:  Not Applicable  Comments: Service time is from 1303 to 1430.    Dr. Genetta Kenning is Medical Director for Pulmonary Rehab at  [facility].

## 2024-03-19 ENCOUNTER — Encounter: Payer: Self-pay | Admitting: Pulmonary Disease

## 2024-03-19 NOTE — Telephone Encounter (Signed)
 These medications were likely given during a period of being inpatient.  I did not order these medications.  He is on Breztri that I have ordered.

## 2024-03-19 NOTE — Telephone Encounter (Signed)
Noted, nothing further needed.  

## 2024-03-23 ENCOUNTER — Encounter (HOSPITAL_COMMUNITY)
Admission: RE | Admit: 2024-03-23 | Discharge: 2024-03-23 | Disposition: A | Discharge status: Home / Self Care | Source: Ambulatory Visit | Attending: Pulmonary Disease

## 2024-03-23 VITALS — Wt 168.0 lb

## 2024-03-23 DIAGNOSIS — J449 Chronic obstructive pulmonary disease, unspecified: Secondary | ICD-10-CM

## 2024-03-23 NOTE — Progress Notes (Signed)
 Daily Session Note  Patient Details  Name: Manuel Richardson MRN: [id number] Date of Birth: [birth year] Referring Provider:   [name] Walk Test from 02/23/2024 in [facility] for Heart, Vascular, & Lung Health  Referring Provider [name]       Encounter Date: 03/23/2024  Check In:  Session Check In - 03/23/24 1329       Check-In   Supervising physician immediately available to respond to emergencies [facility] MD immediately available    Physician(s) Charles Connor, NP    [facility]    Staff Present [name], RN, BSN;Randi Regis Captain BS, ACSM-CEP, Exercise Physiologist;Kaylee Nolon Baxter, MS, ACSM-CEP, Exercise Physiologist;Johnny Alexia Angelucci, MS, Exercise Physiologist    Virtual Visit No    Medication changes reported     No    Fall or balance concerns reported    No    Tobacco Cessation No Change    Warm-up and Cool-down Performed as group-led instruction   orientation only   Resistance Training Performed Yes    VAD Patient? No    PAD/SET Patient? No      Pain Assessment   Currently in Pain? No/denies    Multiple Pain Sites No             Capillary Blood Glucose: No results found for this or any previous visit (from the past 24 hours).   Exercise Prescription Changes - 03/23/24 1400       Response to Exercise   Blood Pressure (Admit) 132/54    Blood Pressure (Exercise) 130/70    Blood Pressure (Exit) 114/68    Heart Rate (Admit) 68 bpm    Heart Rate (Exercise) 104 bpm    Heart Rate (Exit) 86 bpm    Oxygen  Saturation (Admit) 95 %   2L   Oxygen  Saturation (Exercise) 90 %   1L   Oxygen  Saturation (Exit) 93 %   RA   Rating of Perceived Exertion (Exercise) 13    Perceived Dyspnea (Exercise) 0    Duration Progress to 30 minutes of  aerobic without signs/symptoms of physical distress    Intensity THRR unchanged      Progression   Progression Continue to progress workloads to maintain intensity without  signs/symptoms of physical distress.      Resistance Training   Training Prescription Yes    Weight black bands    Reps 10-15    Time 10 Minutes      Oxygen    Oxygen  Continuous    Liters 1-2      NuStep   Level 3    SPM 92    Minutes 15    METs 2.9      Track   Laps 10    Minutes 15    METs 2.25      Oxygen    Maintain Oxygen  Saturation 88% or higher             Social History   Tobacco Use  Smoking Status Former   Current packs/day: 0.00   Average packs/day: 3.0 packs/day for 54.0 years (162.0 ttl pk-yrs)   Types: Cigarettes   Start date: 33   Quit date: 2024   Years since quitting: 1.4  Smokeless Tobacco Never  Tobacco Comments   Quit 12/28/2022 khj 09/17/2023   Smoked for 53 years.    Smoked 3 PPD at his heaviest    Goals Met:  Independence with exercise equipment Exercise tolerated well Strength training completed today  Goals  Unmet:  BILATERAL LEG PAIN  Comments: Service time is from 1314 to 1436    Dr. Genetta Kenning is Medical Director for Pulmonary Rehab at [facility].

## 2024-03-25 ENCOUNTER — Encounter (HOSPITAL_COMMUNITY)
Admission: RE | Admit: 2024-03-25 | Discharge: 2024-03-25 | Disposition: A | Discharge status: Home / Self Care | Source: Ambulatory Visit | Attending: Pulmonary Disease | Admitting: Pulmonary Disease

## 2024-03-25 DIAGNOSIS — J449 Chronic obstructive pulmonary disease, unspecified: Secondary | ICD-10-CM | POA: Diagnosis not present

## 2024-03-25 NOTE — Progress Notes (Signed)
 Daily Session Note  Patient Details  Name: MAKENZIE VITTORIO MRN: [id number] Date of Birth: Nov 30, 1955 Referring Provider:   [name] Walk Test from 02/23/2024 in [facility] for Heart, Vascular, & Lung Health  Referring Provider [name]       Encounter Date: 03/25/2024  Check In:  Session Check In - 03/25/24 1330       Check-In   Supervising physician immediately available to respond to emergencies [facility] MD immediately available    Physician(s) Marlana Silvan, NP    [facility]    Staff Present [name], RN, BSN;Randi Regis Captain BS, ACSM-CEP, Exercise Physiologist;Charon Smedberg Nolon Baxter, MS, ACSM-CEP, Exercise Physiologist;Casey Felipe Horton, RT    Virtual Visit No    Medication changes reported     No    Fall or balance concerns reported    No    Tobacco Cessation No Change    Warm-up and Cool-down Performed as group-led instruction    Resistance Training Performed Yes    VAD Patient? No    PAD/SET Patient? No      Pain Assessment   Currently in Pain? No/denies    Multiple Pain Sites No             Capillary Blood Glucose: No results found for this or any previous visit (from the past 24 hours).    Social History   Tobacco Use  Smoking Status Former   Current packs/day: 0.00   Average packs/day: 3.0 packs/day for 54.0 years (162.0 ttl pk-yrs)   Types: Cigarettes   Start date: 70   Quit date: 2024   Years since quitting: 1.4  Smokeless Tobacco Never  Tobacco Comments   Quit 12/28/2022 khj 09/17/2023   Smoked for 53 years.    Smoked 3 PPD at his heaviest    Goals Met:  Proper associated with RPD/PD & O2 Sat Independence with exercise equipment Exercise tolerated well No report of concerns or symptoms today Strength training completed today  Goals Unmet:  Not Applicable  Comments: Service time is from 1306 to 1445.    Dr. Genetta Kenning is Medical Director for Pulmonary Rehab at [facility]

## 2024-03-30 ENCOUNTER — Encounter (HOSPITAL_COMMUNITY)
Admission: RE | Admit: 2024-03-30 | Discharge: 2024-03-30 | Disposition: A | Discharge status: Home / Self Care | Source: Ambulatory Visit | Attending: Pulmonary Disease | Admitting: Pulmonary Disease

## 2024-03-30 DIAGNOSIS — J449 Chronic obstructive pulmonary disease, unspecified: Secondary | ICD-10-CM | POA: Insufficient documentation

## 2024-03-30 NOTE — Progress Notes (Signed)
 Daily Session Note  Patient Details  Name: Manuel Richardson MRN: [id number] Date of Birth: 1956-03-06 Referring Provider:   [name] Walk Test from 02/23/2024 in [facility] for Heart, Vascular, & Lung Health  Referring Provider [name]       Encounter Date: 03/30/2024  Check In:  Session Check In - 03/30/24 1334       Check-In   Supervising physician immediately available to respond to emergencies [facility] MD immediately available    Physician(s) Koren Persons, NP    [facility]    Staff Present Sueellen Emery BS, ACSM-CEP, Exercise Physiologist;Ladasia Sircy Nolon Baxter, MS, ACSM-CEP, Exercise Physiologist;Casey Dickson Founds, RN, BSN;Johnny Porter, MS, Exercise Physiologist    Virtual Visit No    Medication changes reported     No    Fall or balance concerns reported    No    Tobacco Cessation No Change    Warm-up and Cool-down Performed as group-led instruction    Resistance Training Performed Yes    VAD Patient? No    PAD/SET Patient? No      Pain Assessment   Currently in Pain? No/denies    Multiple Pain Sites No             Capillary Blood Glucose: No results found for this or any previous visit (from the past 24 hours).    Social History   Tobacco Use  Smoking Status Former   Current packs/day: 0.00   Average packs/day: 3.0 packs/day for 54.0 years (162.0 ttl pk-yrs)   Types: Cigarettes   Start date: 19   Quit date: 2024   Years since quitting: 1.4  Smokeless Tobacco Never  Tobacco Comments   Quit 12/28/2022 khj 09/17/2023   Smoked for 53 years.    Smoked 3 PPD at his heaviest    Goals Met:  Proper associated with RPD/PD & O2 Sat Exercise tolerated well No report of concerns or symptoms today Strength training completed today  Goals Unmet:  Not Applicable  Comments: Service time is from 1303 to 1441.    Dr. Genetta Kenning is Medical Director for Pulmonary Rehab at [facility].

## 2024-04-01 ENCOUNTER — Encounter (HOSPITAL_COMMUNITY)
Admission: RE | Admit: 2024-04-01 | Discharge: 2024-04-01 | Disposition: A | Discharge status: Home / Self Care | Source: Ambulatory Visit | Attending: Pulmonary Disease

## 2024-04-01 ENCOUNTER — Ambulatory Visit: Admitting: Pulmonary Disease

## 2024-04-01 DIAGNOSIS — J449 Chronic obstructive pulmonary disease, unspecified: Secondary | ICD-10-CM | POA: Diagnosis not present

## 2024-04-01 NOTE — Progress Notes (Signed)
 Daily Session Note  Patient Details  Name: Manuel Richardson MRN: [id number] Date of Birth: 11-Apr-1956 Referring Provider:   [name] Walk Test from 02/23/2024 in [facility]  Referring Provider [name]       Encounter Date: 04/01/2024  Check In:  Session Check In - 04/01/24 1520       Check-In   Supervising physician immediately available to respond to emergencies [facility] MD immediately available    Physician(s) Palmer Bobo, NP    Location [facility]    Staff Present Sueellen Emery BS, ACSM-CEP, Exercise Physiologist;Tyger Wichman Nolon Baxter, MS, ACSM-CEP, Exercise Physiologist;Casey Arlice Lai, MS, ACSM-CEP, CCRP, Exercise Physiologist    Virtual Visit No    Medication changes reported     No    Fall or balance concerns reported    No    Tobacco Cessation No Change    Warm-up and Cool-down Performed as group-led instruction    Resistance Training Performed Yes    VAD Patient? No    PAD/SET Patient? No      Pain Assessment   Currently in Pain? No/denies    Multiple Pain Sites No             Capillary Blood Glucose: No results found for this or any previous visit (from the past 24 hours).    Social History   Tobacco Use  Smoking Status Former   Current packs/day: 0.00   Average packs/day: 3.0 packs/day for 54.0 years (162.0 ttl pk-yrs)   Types: Cigarettes   Start date: 34   Quit date: 2024   Years since quitting: 1.4  Smokeless Tobacco Never  Tobacco Comments   Quit 12/28/2022 khj 09/17/2023   Smoked for 53 years.    Smoked 3 PPD at his heaviest    Goals Met:  Proper associated with RPD/PD & O2 Sat Exercise tolerated well No report of concerns or symptoms today Strength training completed today  Goals Unmet:  Not Applicable  Comments: Service time is from 1301 to 1438.    Dr. Genetta Kenning is Medical Director for Pulmonary Rehab at [facility].

## 2024-04-06 ENCOUNTER — Encounter (HOSPITAL_COMMUNITY)
Admission: RE | Admit: 2024-04-06 | Discharge: 2024-04-06 | Disposition: A | Discharge status: Home / Self Care | Source: Ambulatory Visit | Attending: Pulmonary Disease | Admitting: Pulmonary Disease

## 2024-04-06 VITALS — Wt 167.1 lb

## 2024-04-06 DIAGNOSIS — J449 Chronic obstructive pulmonary disease, unspecified: Secondary | ICD-10-CM | POA: Diagnosis not present

## 2024-04-06 NOTE — Progress Notes (Signed)
 Daily Session Note  Patient Details  Name: Manuel Richardson MRN: [id number] Date of Birth: 25-Sep-1956 Referring Provider:   [name] Walk Test from 02/23/2024 in [facility]  Referring Provider [name]       Encounter Date: 04/06/2024  Check In:  Session Check In - 04/06/24 1331       Check-In   Supervising physician immediately available to respond to emergencies [facility] MD immediately available    Physician(s) Palmer Bobo, NP    Location [facility]    Staff Present Sueellen Emery BS, ACSM-CEP, Exercise Physiologist;Kaylee Nolon Baxter, MS, ACSM-CEP, Exercise Physiologist;David Vernel Golds, MS, ACSM-CEP, CCRP, Exercise Physiologist;Casey Carmen Chol, RN, BSN;Johnny Porter, MS, Exercise Physiologist    Virtual Visit No    Medication changes reported     No    Fall or balance concerns reported    No    Tobacco Cessation No Change    Warm-up and Cool-down Performed as group-led instruction    Resistance Training Performed Yes    VAD Patient? No    PAD/SET Patient? No      Pain Assessment   Currently in Pain? No/denies    Multiple Pain Sites No             Capillary Blood Glucose: No results found for this or any previous visit (from the past 24 hours).   Exercise Prescription Changes - 04/06/24 1500       Response to Exercise   Blood Pressure (Admit) 110/56    Blood Pressure (Exercise) 138/60    Blood Pressure (Exit) 104/60    Heart Rate (Admit) 75 bpm    Heart Rate (Exercise) 107 bpm    Heart Rate (Exit) 86 bpm    Oxygen  Saturation (Admit) 94 %   1L   Oxygen  Saturation (Exercise) 89 %   1l   Oxygen  Saturation (Exit) 95 %   RA   Rating of Perceived Exertion (Exercise) 11    Perceived Dyspnea (Exercise) 1    Duration Continue with 30 min of aerobic exercise without signs/symptoms of physical distress.    Intensity THRR unchanged      Progression   Progression  Continue to progress workloads to maintain intensity without signs/symptoms of physical distress.      Resistance Training   Training Prescription Yes    Weight black bands    Reps 10-15    Time 10 Minutes      Oxygen    Oxygen  Continuous    Liters 0-1      NuStep   Level 4    SPM 83    Minutes 15    METs 3.1      Track   Laps 12    Minutes 15    METs 2.5      Oxygen    Maintain Oxygen  Saturation 88% or higher             Social History   Tobacco Use  Smoking Status Former   Current packs/day: 0.00   Average packs/day: 3.0 packs/day for 54.0 years (162.0 ttl pk-yrs)   Types: Cigarettes   Start date: 23   Quit date: 2024   Years since quitting: 1.4  Smokeless Tobacco Never  Tobacco Comments   Quit 12/28/2022 khj 09/17/2023   Smoked for 53 years.    Smoked 3 PPD at his heaviest    Goals Met:  Independence with exercise equipment Exercise tolerated well No  report of concerns or symptoms today Strength training completed today  Goals Unmet:  Not Applicable  Comments: Service time is from 1304 to 1440    Dr. Genetta Kenning is Medical Director for Pulmonary Rehab at [facility].

## 2024-04-07 NOTE — Progress Notes (Signed)
 Pulmonary Individual Treatment Plan  Patient Details  Name: Manuel Richardson MRN: [id number] Date of Birth: 1956-03-12 Referring Provider:   [name] Walk Test from 02/23/2024 in [facility]  Referring Provider [name]       Initial Encounter Date:  Flowsheet Row Pulmonary Rehab Walk Test from 02/23/2024 in [facility]  Date 02/23/24       Visit Diagnosis: Stage 3 severe COPD by GOLD classification (HCC)  Patient's Home Medications on Admission:   Current Outpatient Medications:    albuterol  (VENTOLIN  HFA) 108 (90 Base) MCG/ACT inhaler, Inhale 2 puffs into the lungs every 6 (six) hours as needed for wheezing or shortness of breath., Disp: 8 g, Rfl: 2   amLODipine  (NORVASC ) 5 MG tablet, Take 1 tablet (5 mg total) by mouth daily., Disp: 90 tablet, Rfl: 3   aspirin  EC 81 MG tablet, Take 1 tablet (81 mg total) by mouth daily. Swallow whole., Disp: , Rfl:    BREZTRI AEROSPHERE 160-9-4.8 MCG/ACT AERO, Inhale 2 puffs into the lungs 2 (two) times daily., Disp: , Rfl:    ezetimibe  (ZETIA ) 10 MG tablet, Take 1 tablet (10 mg total) by mouth daily., Disp: 90 tablet, Rfl: 3   losartan  (COZAAR ) 50 MG tablet, Take 1 tablet (50 mg total) by mouth daily., Disp: 90 tablet, Rfl: 3   metoprolol  succinate (TOPROL  XL) 50 MG 24 hr tablet, Take 1 tablet (50 mg total) by mouth daily. Take with or immediately following a meal., Disp: 90 tablet, Rfl: 2   nitroGLYCERIN  (NITROSTAT ) 0.4 MG SL tablet, Place 1 tablet (0.4 mg total) under the tongue every 5 (five) minutes as needed for chest pain., Disp: 25 tablet, Rfl: 3   ranolazine  (RANEXA ) 500 MG 12 hr tablet, Take 1 tablet (500 mg total) by mouth 2 (two) times daily., Disp: 180 tablet, Rfl: 2   rosuvastatin  (CRESTOR ) 40 MG tablet, Take 1 tablet (40 mg total) by mouth daily., Disp: 90 tablet, Rfl: 3  Past Medical  History: Past Medical History:  Diagnosis Date   COPD (chronic obstructive pulmonary disease) (HCC)    Erectile dysfunction    Full dentures    GERD (gastroesophageal reflux disease)    Hyperlipidemia    Tobacco use disorder     Tobacco Use: Social History   Tobacco Use  Smoking Status Former   Current packs/day: 0.00   Average packs/day: 3.0 packs/day for 54.0 years (162.0 ttl pk-yrs)   Types: Cigarettes   Start date: 76   Quit date: 2024   Years since quitting: 1.4  Smokeless Tobacco Never  Tobacco Comments   Quit 12/28/2022 khj 09/17/2023   Smoked for 53 years.    Smoked 3 PPD at his heaviest    Labs: Review Flowsheet  More data exists      Latest Ref Rng & Units 03/20/2011 03/30/2012 04/01/2021 06/11/2023 01/05/2024  Labs for ITP Cardiac and Pulmonary Rehab  Cholestrol 100 - 199 mg/dL - 119  - 147  829   LDL (calc) 0 - 99 mg/dL - 562  - 86  89   HDL-C >39 mg/dL - 52  - 51  49   Trlycerides 0 - 149 mg/dL - 130  - 98  865   Hemoglobin A1c 4.8 - 5.6 % 5.8  5.9  6.7  - -    Capillary Blood Glucose: Lab Results  Component Value Date  GLUCAP 291 (H) 04/01/2021     Pulmonary Assessment Scores:  Pulmonary Assessment Scores     Row Name 02/23/24 1332         ADL UCSD   ADL Phase Entry     SOB Score total 53       CAT Score   CAT Score 15       mMRC Score   mMRC Score 4             UCSD: Self-administered rating of dyspnea associated with activities of daily living (ADLs) 6-point scale (0 = not at all to 5 = maximal or unable to do because of breathlessness)  Scoring Scores range from 0 to 120.  Minimally important difference is 5 units  CAT: CAT can identify the health impairment of COPD patients and is better correlated with disease progression.  CAT has a scoring range of zero to 40. The CAT score is classified into four groups of low (less than 10), medium (10 - 20), high (21-30) and very high (31-40) based on the impact level of disease on  health status. A CAT score over 10 suggests significant symptoms.  A worsening CAT score could be explained by an exacerbation, poor medication adherence, poor inhaler technique, or progression of COPD or comorbid conditions.  CAT MCID is 2 points  mMRC: mMRC (Modified Medical Research Council) Dyspnea Scale is used to assess the degree of baseline functional disability in patients of respiratory disease due to dyspnea. No minimal important difference is established. A decrease in score of 1 point or greater is considered a positive change.   Pulmonary Function Assessment:  Pulmonary Function Assessment - 02/23/24 1514       Breath   Bilateral Breath Sounds Decreased             Exercise Target Goals: Exercise Program Goal: Individual exercise prescription set using results from initial 6 min walk test and THRR while considering  patient's activity barriers and safety.   Exercise Prescription Goal: Initial exercise prescription builds to 30-45 minutes a day of aerobic activity, 2-3 days per week.  Home exercise guidelines will be given to patient during program as part of exercise prescription that the participant will acknowledge.  Activity Barriers & Risk Stratification:  Activity Barriers & Cardiac Risk Stratification - 02/23/24 1353       Activity Barriers & Cardiac Risk Stratification   Activity Barriers Deconditioning;Muscular Weakness;Shortness of Breath    Cardiac Risk Stratification High             6 Minute Walk:  6 Minute Walk     Row Name 02/23/24 1435         6 Minute Walk   Phase Initial     Distance 840 feet     Walk Time 6 minutes     # of Rest Breaks 1  claudication at 4 min, rest 3:58-6:00     MPH 1.59     METS 2.5     RPE 11     Perceived Dyspnea  2     VO2 Peak 8.76     Symptoms Yes (comment)     Comments had to rest due to claudication. Also needed 1L after 4 min     Resting HR 66 bpm     Resting BP 120/60     Resting Oxygen   Saturation  93 %     Exercise Oxygen  Saturation  during 6 min walk 86 %  Max Ex. HR 102 bpm     Max Ex. BP 146/70     2 Minute Post BP 138/64       Interval HR   1 Minute HR 75     2 Minute HR 91     3 Minute HR 97     4 Minute HR 102     5 Minute HR 91     6 Minute HR 76     2 Minute Post HR 56     Interval Heart Rate? Yes       Interval Oxygen    Interval Oxygen ? Yes     Baseline Oxygen  Saturation % 93 %     1 Minute Oxygen  Saturation % 94 %     1 Minute Liters of Oxygen  0 L     2 Minute Oxygen  Saturation % 90 %     2 Minute Liters of Oxygen  0 L     3 Minute Oxygen  Saturation % 88 %     3 Minute Liters of Oxygen  0 L     4 Minute Oxygen  Saturation % 87 %     4 Minute Liters of Oxygen  0 L     5 Minute Oxygen  Saturation % 89 %     5 Minute Liters of Oxygen  1 L  SpO2 down to 86 RA, applied 1L     6 Minute Oxygen  Saturation % 96 %     6 Minute Liters of Oxygen  1 L     2 Minute Post Oxygen  Saturation % 99 %     2 Minute Post Liters of Oxygen  1 L              Oxygen  Initial Assessment:  Oxygen  Initial Assessment - 02/23/24 1350       Home Oxygen    Home Oxygen  Device Home Concentrator;E-Tanks    Sleep Oxygen  Prescription Continuous    Liters per minute 3    Home Exercise Oxygen  Prescription None    Home Resting Oxygen  Prescription None    Compliance with Home Oxygen  Use Yes      Initial 6 min Walk   Oxygen  Used Continuous    Liters per minute 1      Program Oxygen  Prescription   Program Oxygen  Prescription Continuous    Liters per minute 1      Intervention   Short Term Goals To learn and exhibit compliance with exercise, home and travel O2 prescription;To learn and understand importance of maintaining oxygen  saturations>88%;To learn and demonstrate proper use of respiratory medications;To learn and understand importance of monitoring SPO2 with pulse oximeter and demonstrate accurate use of the pulse oximeter.;To learn and demonstrate proper pursed lip  breathing techniques or other breathing techniques. ;Other    Long  Term Goals Demonstrates proper use of MDI's;Compliance with respiratory medication;Other;Exhibits proper breathing techniques, such as pursed lip breathing or other method taught during program session;Maintenance of O2 saturations>88%;Verbalizes importance of monitoring SPO2 with pulse oximeter and return demonstration;Exhibits compliance with exercise, home  and travel O2 prescription             Oxygen  Re-Evaluation:  Oxygen  Re-Evaluation     Row Name 03/02/24 7893 03/29/24 1108 04/05/24 0852         Program Oxygen  Prescription   Program Oxygen  Prescription Continuous Continuous Continuous     Liters per minute 1 1 1        Home Oxygen    Home Oxygen  Device Home Concentrator;E-Tanks Home Concentrator;E-Tanks Home Concentrator;E-Tanks  Sleep Oxygen  Prescription Continuous Continuous Continuous     Liters per minute 3 3 3      Home Exercise Oxygen  Prescription None None None     Home Resting Oxygen  Prescription None None None     Compliance with Home Oxygen  Use Yes Yes Yes       Goals/Expected Outcomes   Short Term Goals To learn and exhibit compliance with exercise, home and travel O2 prescription;To learn and understand importance of maintaining oxygen  saturations>88%;To learn and demonstrate proper use of respiratory medications;To learn and understand importance of monitoring SPO2 with pulse oximeter and demonstrate accurate use of the pulse oximeter.;To learn and demonstrate proper pursed lip breathing techniques or other breathing techniques. ;Other To learn and exhibit compliance with exercise, home and travel O2 prescription;To learn and understand importance of maintaining oxygen  saturations>88%;To learn and demonstrate proper use of respiratory medications;To learn and understand importance of monitoring SPO2 with pulse oximeter and demonstrate accurate use of the pulse oximeter.;To learn and demonstrate  proper pursed lip breathing techniques or other breathing techniques. ;Other To learn and exhibit compliance with exercise, home and travel O2 prescription;To learn and understand importance of maintaining oxygen  saturations>88%;To learn and demonstrate proper use of respiratory medications;To learn and understand importance of monitoring SPO2 with pulse oximeter and demonstrate accurate use of the pulse oximeter.;To learn and demonstrate proper pursed lip breathing techniques or other breathing techniques. ;Other     Long  Term Goals Demonstrates proper use of MDI's;Compliance with respiratory medication;Other;Exhibits proper breathing techniques, such as pursed lip breathing or other method taught during program session;Maintenance of O2 saturations>88%;Verbalizes importance of monitoring SPO2 with pulse oximeter and return demonstration;Exhibits compliance with exercise, home  and travel O2 prescription Demonstrates proper use of MDI's;Compliance with respiratory medication;Other;Exhibits proper breathing techniques, such as pursed lip breathing or other method taught during program session;Maintenance of O2 saturations>88%;Verbalizes importance of monitoring SPO2 with pulse oximeter and return demonstration;Exhibits compliance with exercise, home  and travel O2 prescription Demonstrates proper use of MDI's;Compliance with respiratory medication;Other;Exhibits proper breathing techniques, such as pursed lip breathing or other method taught during program session;Maintenance of O2 saturations>88%;Verbalizes importance of monitoring SPO2 with pulse oximeter and return demonstration;Exhibits compliance with exercise, home  and travel O2 prescription     Goals/Expected Outcomes Compliance and understanding of oxygen  saturation monitoring and breath techniques to decrease shortness of breath. Compliance and understanding of oxygen  saturation monitoring and breath techniques to decrease shortness of breath.  Compliance and understanding of oxygen  saturation monitoring and breath techniques to decrease shortness of breath.              Oxygen  Discharge (Final Oxygen  Re-Evaluation):  Oxygen  Re-Evaluation - 04/05/24 0852       Program Oxygen  Prescription   Program Oxygen  Prescription Continuous    Liters per minute 1      Home Oxygen    Home Oxygen  Device Home Concentrator;E-Tanks    Sleep Oxygen  Prescription Continuous    Liters per minute 3    Home Exercise Oxygen  Prescription None    Home Resting Oxygen  Prescription None    Compliance with Home Oxygen  Use Yes      Goals/Expected Outcomes   Short Term Goals To learn and exhibit compliance with exercise, home and travel O2 prescription;To learn and understand importance of maintaining oxygen  saturations>88%;To learn and demonstrate proper use of respiratory medications;To learn and understand importance of monitoring SPO2 with pulse oximeter and demonstrate accurate use of the pulse oximeter.;To learn and demonstrate proper pursed lip breathing  techniques or other breathing techniques. ;Other    Long  Term Goals Demonstrates proper use of MDI's;Compliance with respiratory medication;Other;Exhibits proper breathing techniques, such as pursed lip breathing or other method taught during program session;Maintenance of O2 saturations>88%;Verbalizes importance of monitoring SPO2 with pulse oximeter and return demonstration;Exhibits compliance with exercise, home  and travel O2 prescription    Goals/Expected Outcomes Compliance and understanding of oxygen  saturation monitoring and breath techniques to decrease shortness of breath.             Initial Exercise Prescription:  Initial Exercise Prescription - 02/23/24 1500       Date of Initial Exercise RX and Referring Provider   Date 02/23/24    Referring Provider [name]    Expected Discharge Date 05/20/24      Oxygen    Oxygen  Continuous    Liters 1    Maintain Oxygen  Saturation 88%  or higher      NuStep   Level 2    SPM 70    Minutes 15    METs 2.5      Track   Laps 8    Minutes 15    METs 2.5      Prescription Details   Frequency (times per week) 2    Duration Progress to 30 minutes of continuous aerobic without signs/symptoms of physical distress      Intensity   THRR 40-80% of Max Heartrate 61-122    Ratings of Perceived Exertion 11-13    Perceived Dyspnea 0-4      Progression   Progression Continue to progress workloads to maintain intensity without signs/symptoms of physical distress.      Resistance Training   Training Prescription Yes    Weight black bands    Reps 10-15             Perform Capillary Blood Glucose checks as needed.  Exercise Prescription Changes:   Exercise Prescription Changes     Row Name 03/04/24 1151 03/23/24 1400 04/06/24 1500         Response to Exercise   Blood Pressure (Admit) 124/60 132/54 110/56     Blood Pressure (Exercise) -- 130/70 138/60     Blood Pressure (Exit) 112/68 114/68 104/60     Heart Rate (Admit) 69 bpm 68 bpm 75 bpm     Heart Rate (Exercise) 97 bpm 104 bpm 107 bpm     Heart Rate (Exit) 82 bpm 86 bpm 86 bpm     Oxygen  Saturation (Admit) 96 % 95 %  2L 94 %  1L     Oxygen  Saturation (Exercise) 95 % 90 %  1L 89 %  1l     Oxygen  Saturation (Exit) 98 % 93 %  RA 95 %  RA     Rating of Perceived Exertion (Exercise) 11 13 11      Perceived Dyspnea (Exercise) 1 0 1     Duration Progress to 30 minutes of  aerobic without signs/symptoms of physical distress Progress to 30 minutes of  aerobic without signs/symptoms of physical distress Continue with 30 min of aerobic exercise without signs/symptoms of physical distress.     Intensity THRR unchanged THRR unchanged THRR unchanged       Progression   Progression Continue to progress workloads to maintain intensity without signs/symptoms of physical distress. Continue to progress workloads to maintain intensity without signs/symptoms of physical  distress. Continue to progress workloads to maintain intensity without signs/symptoms of physical distress.     Average METs 2.7 -- --  Resistance Training   Training Prescription Yes Yes Yes     Weight black bands black bands black bands     Reps 10-15 10-15 10-15     Time 10 Minutes 10 Minutes 10 Minutes       Oxygen    Oxygen  Continuous Continuous Continuous     Liters 3 1-2 0-1       NuStep   Level 2 3 4      SPM 89 92 83     Minutes 15 15 15      METs 2.7 2.9 3.1       Track   Laps 8 10 12      Minutes 15 15 15      METs 2.23 2.25 2.5       Oxygen    Maintain Oxygen  Saturation 88% or higher 88% or higher 88% or higher              Exercise Comments:   Exercise Goals and Review:   Exercise Goals     Row Name 02/23/24 1334             Exercise Goals   Increase Physical Activity Yes       Intervention Provide advice, education, support and counseling about physical activity/exercise needs.;Develop an individualized exercise prescription for aerobic and resistive training based on initial evaluation findings, risk stratification, comorbidities and participant's personal goals.       Expected Outcomes Short Term: Attend rehab on a regular basis to increase amount of physical activity.;Long Term: Add in home exercise to make exercise part of routine and to increase amount of physical activity.;Long Term: Exercising regularly at least 3-5 days a week.       Increase Strength and Stamina Yes       Intervention Provide advice, education, support and counseling about physical activity/exercise needs.;Develop an individualized exercise prescription for aerobic and resistive training based on initial evaluation findings, risk stratification, comorbidities and participant's personal goals.       Expected Outcomes Short Term: Increase workloads from initial exercise prescription for resistance, speed, and METs.;Short Term: Perform resistance training exercises routinely  during rehab and add in resistance training at home;Long Term: Improve cardiorespiratory fitness, muscular endurance and strength as measured by increased METs and functional capacity ( )       Able to understand and use rate of perceived exertion (RPE) scale Yes       Intervention Provide education and explanation on how to use RPE scale       Expected Outcomes Short Term: Able to use RPE daily in rehab to express subjective intensity level;Long Term:  Able to use RPE to guide intensity level when exercising independently       Able to understand and use Dyspnea scale Yes       Intervention Provide education and explanation on how to use Dyspnea scale       Expected Outcomes Short Term: Able to use Dyspnea scale daily in rehab to express subjective sense of shortness of breath during exertion;Long Term: Able to use Dyspnea scale to guide intensity level when exercising independently       Knowledge and understanding of Target Heart Rate Range (THRR) Yes       Intervention Provide education and explanation of THRR including how the numbers were predicted and where they are located for reference       Expected Outcomes Short Term: Able to state/look up THRR;Long Term: Able to use THRR to govern intensity when exercising independently;Short  Term: Able to use daily as guideline for intensity in rehab       Understanding of Exercise Prescription Yes       Intervention Provide education, explanation, and written materials on patient's individual exercise prescription       Expected Outcomes Short Term: Able to explain program exercise prescription;Long Term: Able to explain home exercise prescription to exercise independently                Exercise Goals Re-Evaluation :  Exercise Goals Re-Evaluation     Row Name 03/02/24 0847 03/29/24 1111 04/05/24 0853         Exercise Goal Re-Evaluation   Exercise Goals Review Increase Physical Activity;Able to understand and use Dyspnea  scale;Understanding of Exercise Prescription;Increase Strength and Stamina;Knowledge and understanding of Target Heart Rate Range (THRR);Able to understand and use rate of perceived exertion (RPE) scale Increase Physical Activity;Able to understand and use Dyspnea scale;Understanding of Exercise Prescription;Increase Strength and Stamina;Knowledge and understanding of Target Heart Rate Range (THRR);Able to understand and use rate of perceived exertion (RPE) scale (P)  Increase Physical Activity;Able to understand and use Dyspnea scale;Understanding of Exercise Prescription;Increase Strength and Stamina;Knowledge and understanding of Target Heart Rate Range (THRR);Able to understand and use rate of perceived exertion (RPE) scale     Comments Pt is scheduled to begin exercise 5/6. Will progress as tolerated. Pt is scheduled to begin exercise 5/6. Will progress as tolerated. (P)  Pt has completed 6 exercise sessions. He is now walking on the treadmill (started on the track), 15 min, 1.5 mph, 0 incline, METs 2.0. He requires 1-2 rest stops due to claudication. He then exercises on the recumbent stepper for 15 min, level 3, METs 2.7. He is using black bands, 7.3 lbs. Will progress as tolerated. We are encouraging him to do the PAD program after PR.     Expected Outcomes Through exercise in rehab and at home, the patient will decrease shortness of breath with daily activities and feel confident in doing an exercise regimen at home. Through exercise in rehab and at home, the patient will decrease shortness of breath with daily activities and feel confident in doing an exercise regimen at home. (P)  Through exercise in rehab and at home, the patient will decrease shortness of breath with daily activities and feel confident in doing an exercise regimen at home.              Discharge Exercise Prescription (Final Exercise Prescription Changes):  Exercise Prescription Changes - 04/06/24 1500       Response to  Exercise   Blood Pressure (Admit) 110/56    Blood Pressure (Exercise) 138/60    Blood Pressure (Exit) 104/60    Heart Rate (Admit) 75 bpm    Heart Rate (Exercise) 107 bpm    Heart Rate (Exit) 86 bpm    Oxygen  Saturation (Admit) 94 %   1L   Oxygen  Saturation (Exercise) 89 %   1l   Oxygen  Saturation (Exit) 95 %   RA   Rating of Perceived Exertion (Exercise) 11    Perceived Dyspnea (Exercise) 1    Duration Continue with 30 min of aerobic exercise without signs/symptoms of physical distress.    Intensity THRR unchanged      Progression   Progression Continue to progress workloads to maintain intensity without signs/symptoms of physical distress.      Resistance Training   Training Prescription Yes    Weight black bands    Reps 10-15  Time 10 Minutes      Oxygen    Oxygen  Continuous    Liters 0-1      NuStep   Level 4    SPM 83    Minutes 15    METs 3.1      Track   Laps 12    Minutes 15    METs 2.5      Oxygen    Maintain Oxygen  Saturation 88% or higher             Nutrition:  Target Goals: Understanding of nutrition guidelines, daily intake of sodium 1500mg , cholesterol 200mg , calories 30% from fat and 7% or less from saturated fats, daily to have 5 or more servings of fruits and vegetables.  Biometrics:    Nutrition Therapy Plan and Nutrition Goals:  Nutrition Therapy & Goals - 04/01/24 1351       Nutrition Therapy   Diet Heart Healthy Diet    Drug/Food Interactions Statins/Certain Fruits      Personal Nutrition Goals   Nutrition Goal Patient to improve diet quality by using the plate method as a guide for meal planning to include lean protein/plant protein, fruits, vegetables, whole grains, nonfat dairy as part of a well-balanced diet.    Comments Goal in progress. Patient has medical history of hyperlipidemia, CAD, HTN, PAD, COPD3. Triglycerides remain elevated, A1c is in a prediabetic range. He reports stable appetite/eating patterns and no  nutrition concerns at this time. He has maintained his weight since starting with our program. Patient will benefit from participation in pulmonary rehab for nutrition, exercise, and lifestyle modification.      Intervention Plan   Intervention Prescribe, educate and counsel regarding individualized specific dietary modifications aiming towards targeted core components such as weight, hypertension, lipid management, diabetes, heart failure and other comorbidities.;Nutrition handout(s) given to patient.    Expected Outcomes Short Term Goal: Understand basic principles of dietary content, such as calories, fat, sodium, cholesterol and nutrients.;Long Term Goal: Adherence to prescribed nutrition plan.             Nutrition Assessments:  MEDIFICTS Score Key: >=70 Need to make dietary changes  40-70 Heart Healthy Diet <= 40 Therapeutic Level Cholesterol Diet   Picture Your Plate Scores: <16 Unhealthy dietary pattern with much room for improvement. 41-50 Dietary pattern unlikely to meet recommendations for good health and room for improvement. 51-60 More healthful dietary pattern, with some room for improvement.  >60 Healthy dietary pattern, although there may be some specific behaviors that could be improved.    Nutrition Goals Re-Evaluation:  Nutrition Goals Re-Evaluation     Row Name 03/04/24 1358 04/01/24 1351           Goals   Current Weight 170 lb 6.7 oz (77.3 kg) 169 lb 15.6 oz (77.1 kg)      Comment triglycerides 237, LDL 89, HDL 49, A1c 6.2 no new labs; most recent labs triglycerides 237, LDL 89 (zetia , crestor ), HDL 49, A1c 6.2      Expected Outcome Patient has medical history of hyperlipidemia, CAD, HTN, PAD, COPD3. Triglycerides remain elevated, A1c is in a prediabetic range. Patient will benefit from participation in pulmonary rehab for nutrition, exercise, and lifestyle modification. Goal in progress. Patient has medical history of hyperlipidemia, CAD, HTN, PAD, COPD3.  Triglycerides remain elevated, A1c is in a prediabetic range. He reports stable appetite/eating patterns and no nutrition concerns at this time. He has maintained his weight since starting with our program. Patient will benefit from participation  in pulmonary rehab for nutrition, exercise, and lifestyle modification.               Nutrition Goals Discharge (Final Nutrition Goals Re-Evaluation):  Nutrition Goals Re-Evaluation - 04/01/24 1351       Goals   Current Weight 169 lb 15.6 oz (77.1 kg)    Comment no new labs; most recent labs triglycerides 237, LDL 89 (zetia , crestor ), HDL 49, A1c 6.2    Expected Outcome Goal in progress. Patient has medical history of hyperlipidemia, CAD, HTN, PAD, COPD3. Triglycerides remain elevated, A1c is in a prediabetic range. He reports stable appetite/eating patterns and no nutrition concerns at this time. He has maintained his weight since starting with our program. Patient will benefit from participation in pulmonary rehab for nutrition, exercise, and lifestyle modification.             Psychosocial: Target Goals: Acknowledge presence or absence of significant depression and/or stress, maximize coping skills, provide positive support system. Participant is able to verbalize types and ability to use techniques and skills needed for reducing stress and depression.  Initial Review & Psychosocial Screening:  Initial Psych Review & Screening - 02/23/24 1333       Family Dynamics   Good Support System? Yes    Comments sister Valinda Gault      Barriers   Psychosocial barriers to participate in program The patient should benefit from training in stress management and relaxation.      Screening Interventions   Interventions Encouraged to exercise    Expected Outcomes Long Term goal: The participant improves quality of Life and PHQ9 Scores as seen by post scores and/or verbalization of changes;Short Term goal: Identification and review with participant of  any Quality of Life or Depression concerns found by scoring the questionnaire.;Long Term Goal: Stressors or current issues are controlled or eliminated.;Short Term goal: Utilizing psychosocial counselor, staff and physician to assist with identification of specific Stressors or current issues interfering with healing process. Setting desired goal for each stressor or current issue identified.             Quality of Life Scores:  Scores of 19 and below usually indicate a poorer quality of life in these areas.  A difference of  2-3 points is a clinically meaningful difference.  A difference of 2-3 points in the total score of the Quality of Life Index has been associated with significant improvement in overall quality of life, self-image, physical symptoms, and general health in studies assessing change in quality of life.  PHQ-9: Review Flowsheet       02/23/2024 09/21/2018  Depression screen PHQ 2/9  Decreased Interest 0 0  Down, Depressed, Hopeless 0 0  PHQ - 2 Score 0 0  Altered sleeping 0 -  Tired, decreased energy 2 -  Change in appetite 0 -  Feeling bad or failure about yourself  0 -  Trouble concentrating 0 -  Moving slowly or fidgety/restless 0 -  Suicidal thoughts 0 -  PHQ-9 Score 2 -  Difficult doing work/chores Somewhat difficult -   Interpretation of Total Score  Total Score Depression Severity:  1-4 = Minimal depression, 5-9 = Mild depression, 10-14 = Moderate depression, 15-19 = Moderately severe depression, 20-27 = Severe depression   Psychosocial Evaluation and Intervention:  Psychosocial Evaluation - 02/23/24 1348       Psychosocial Evaluation & Interventions   Interventions Encouraged to exercise with the program and follow exercise prescription    Expected Outcomes For pt  to participate in PR    Continue Psychosocial Services  Follow up required by staff             Psychosocial Re-Evaluation:  Psychosocial Re-Evaluation     Row Name 03/04/24 0915  04/05/24 1520           Psychosocial Re-Evaluation   Current issues with None Identified None Identified      Comments Psychosocial monthly re-evaluation is as follows: Manuel Richardson has attended 1 session so far. He denies any new psy/soc concerns. He declines any additional resources or referrals at this time. Psychosocial monthly re-evaluation is as follows: Manuel Richardson continues to deny any new psy/soc concerns. He declines any additional resources or referrals at this time.      Expected Outcomes For Manuel Richardson to continue to be free of any psy/soc barriers or concerns. For Manuel Richardson to continue to be free of any psy/soc barriers or concerns.      Interventions Encouraged to attend Pulmonary Rehabilitation for the exercise Encouraged to attend Pulmonary Rehabilitation for the exercise      Continue Psychosocial Services  No Follow up required No Follow up required               Psychosocial Discharge (Final Psychosocial Re-Evaluation):  Psychosocial Re-Evaluation - 04/05/24 1520       Psychosocial Re-Evaluation   Current issues with None Identified    Comments Psychosocial monthly re-evaluation is as follows: Manuel Richardson continues to deny any new psy/soc concerns. He declines any additional resources or referrals at this time.    Expected Outcomes For Manuel Richardson to continue to be free of any psy/soc barriers or concerns.    Interventions Encouraged to attend Pulmonary Rehabilitation for the exercise    Continue Psychosocial Services  No Follow up required             Education: Education Goals: Education classes will be provided on a weekly basis, covering required topics. Participant will state understanding/return demonstration of topics presented.  Learning Barriers/Preferences:  Learning Barriers/Preferences - 02/23/24 1349       Learning Barriers/Preferences   Learning Barriers None    Learning Preferences Skilled Demonstration;Group Instruction             Education Topics: Know Your  Numbers Group instruction that is supported by a PowerPoint presentation. Instructor discusses importance of knowing and understanding resting, exercise, and post-exercise oxygen  saturation, heart rate, and blood pressure. Oxygen  saturation, heart rate, blood pressure, rating of perceived exertion, and dyspnea are reviewed along with a normal range for these values.    Exercise for the Pulmonary Patient Group instruction that is supported by a PowerPoint presentation. Instructor discusses benefits of exercise, core components of exercise, frequency, duration, and intensity of an exercise routine, importance of utilizing pulse oximetry during exercise, safety while exercising, and options of places to exercise outside of rehab.    MET Level  Group instruction provided by PowerPoint, verbal discussion, and written material to support subject matter. Instructor reviews what METs are and how to increase METs.  Flowsheet Row PULMONARY REHAB CHRONIC OBSTRUCTIVE PULMONARY DISEASE from 03/18/2024 in [facility]  Date 03/18/24  Educator EP  Instruction Review Code 1- Verbalizes Understanding       Pulmonary Medications Verbally interactive group education provided by instructor with focus on inhaled medications and proper administration.   Anatomy and Physiology of the Respiratory System Group instruction provided by PowerPoint, verbal discussion, and written material to support  subject matter. Instructor reviews respiratory cycle and anatomical components of the respiratory system and their functions. Instructor also reviews differences in obstructive and restrictive respiratory diseases with examples of each.  Flowsheet Row PULMONARY REHAB CHRONIC OBSTRUCTIVE PULMONARY DISEASE from 04/01/2024 in [facility], [facility], & [facility]  Date 04/01/24  Educator RT  Instruction Review Code 1- Verbalizes  Understanding       Oxygen  Safety Group instruction provided by PowerPoint, verbal discussion, and written material to support subject matter. There is an overview of "What is Oxygen " and "Why do we need it".  Instructor also reviews how to create a safe environment for oxygen  use, the importance of using oxygen  as prescribed, and the risks of noncompliance. There is a brief discussion on traveling with oxygen  and resources the patient may utilize.   Oxygen  Use Group instruction provided by PowerPoint, verbal discussion, and written material to discuss how supplemental oxygen  is prescribed and different types of oxygen  supply systems. Resources for more information are provided.    Breathing Techniques Group instruction that is supported by demonstration and informational handouts. Instructor discusses the benefits of pursed lip and diaphragmatic breathing and detailed demonstration on how to perform both.     Risk Factor Reduction Group instruction that is supported by a PowerPoint presentation. Instructor discusses the definition of a risk factor, different risk factors for pulmonary disease, and how the heart and lungs work together.   Pulmonary Diseases Group instruction provided by PowerPoint, verbal discussion, and written material to support subject matter. Instructor gives an overview of the different type of pulmonary diseases. There is also a discussion on risk factors and symptoms as well as ways to manage the diseases. Flowsheet Row PULMONARY REHAB CHRONIC OBSTRUCTIVE PULMONARY DISEASE from 03/25/2024 in [facility], Vascular, [facility]  Date 03/25/24  Educator RT  Instruction Review Code 1- Verbalizes Understanding       Stress and Energy Conservation Group instruction provided by PowerPoint, verbal discussion, and written material to support subject matter. Instructor gives an overview of stress and the impact it can have on the body.  Instructor also reviews ways to reduce stress. There is also a discussion on energy conservation and ways to conserve energy throughout the day.   Warning Signs and Symptoms Group instruction provided by PowerPoint, verbal discussion, and written material to support subject matter. Instructor reviews warning signs and symptoms of stroke, heart attack, cold and flu. Instructor also reviews ways to prevent the spread of infection. Flowsheet Row PULMONARY REHAB CHRONIC OBSTRUCTIVE PULMONARY DISEASE from 03/04/2024 in [facility] for Heart, Vascular, & Lung Health  Date 03/04/24  Educator RN  Instruction Review Code 1- Verbalizes Understanding       Other Education Group or individual verbal, written, or video instructions that support the educational goals of the pulmonary rehab program.    Knowledge Questionnaire Score:  Knowledge Questionnaire Score - 02/23/24 1344       Knowledge Questionnaire Score   Pre Score 15/18             Core Components/Risk Factors/Patient Goals at Admission:  Personal Goals and Risk Factors at Admission - 02/23/24 1334       Core Components/Risk Factors/Patient Goals on Admission   Improve shortness of breath with ADL's Yes    Intervention Provide education, individualized exercise plan and daily activity instruction to help decrease symptoms of SOB with activities of daily living.  Expected Outcomes Short Term: Improve cardiorespiratory fitness to achieve a reduction of symptoms when performing ADLs;Long Term: Be able to perform more ADLs without symptoms or delay the onset of symptoms             Core Components/Risk Factors/Patient Goals Review:   Goals and Risk Factor Review     Row Name 03/04/24 0917 04/05/24 1521           Core Components/Risk Factors/Patient Goals Review   Personal Goals Review Improve shortness of breath with ADL's;Develop more efficient breathing techniques such as purse lipped breathing  and diaphragmatic breathing and practicing self-pacing with activity. Improve shortness of breath with ADL's;Develop more efficient breathing techniques such as purse lipped breathing and diaphragmatic breathing and practicing self-pacing with activity.      Review Monthly review of patient's Core Components/Risk Factors/Patient Goals are as follows: Unable to assess progress. Manuel Richardson has attended 1 class so far. Monthly review of patient's Core Components/Risk Factors/Patient Goals are as follows: Monthly review of patient's Core Components/Risk Factors/Patient Goals are as follows: Goal progressing for improving shortness of breath with ADL's. Manuel Richardson is currently able to maintain sats >88% on 1L [place] while exercising on the Nu[employer] and walking the track. He is building up his stamina to get back to the things he once used to do. Goal met for developing more efficient breathing techniques such as purse lipped breathing and diaphragmatic breathing; and practicing self-pacing with activity. Manuel Richardson is practicing his PLB and can initiate this technique independently. He knows how to slow down his pace or stop when dyspneic. He is practicing diaphragmatic breathing at home, before bed. We will continue to monitor Manuel Richardson's progress throughout the program. He will continue to benefit from PR for nutrition, education, exercise, and lifestyle modification.      Expected Outcomes Pt will show progress toward meeting expected goals and outcomes. Pt will show progress toward meeting expected goals and outcomes.               Core Components/Risk Factors/Patient Goals at Discharge (Final Review):   Goals and Risk Factor Review - 04/05/24 1521       Core Components/Risk Factors/Patient Goals Review   Personal Goals Review Improve shortness of breath with ADL's;Develop more efficient breathing techniques such as purse lipped breathing and diaphragmatic breathing and practicing self-pacing with activity.    Review Monthly  review of patient's Core Components/Risk Factors/Patient Goals are as follows: Monthly review of patient's Core Components/Risk Factors/Patient Goals are as follows: Goal progressing for improving shortness of breath with ADL's. Manuel Richardson is currently able to maintain sats >88% on 1L [place] while exercising on the Nu[employer] and walking the track. He is building up his stamina to get back to the things he once used to do. Goal met for developing more efficient breathing techniques such as purse lipped breathing and diaphragmatic breathing; and practicing self-pacing with activity. Manuel Richardson is practicing his PLB and can initiate this technique independently. He knows how to slow down his pace or stop when dyspneic. He is practicing diaphragmatic breathing at home, before bed. We will continue to monitor Manuel Richardson's progress throughout the program. He will continue to benefit from PR for nutrition, education, exercise, and lifestyle modification.    Expected Outcomes Pt will show progress toward meeting expected goals and outcomes.             ITP Comments: Pt is making expected progress toward Pulmonary Rehab goals after completing 9 session(s). Recommend continued exercise, life style  modification, education, and utilization of breathing techniques to increase stamina and strength, while also decreasing shortness of breath with exertion.   Comments: Dr. Genetta Kenning is Medical Director for Pulmonary Rehab at [facility].

## 2024-04-08 ENCOUNTER — Encounter (HOSPITAL_COMMUNITY)

## 2024-04-08 ENCOUNTER — Telehealth (HOSPITAL_COMMUNITY): Payer: Self-pay

## 2024-04-08 NOTE — Telephone Encounter (Signed)
 Patient c/o for 1:15pm PR class, states he has an upset stomach. Confirmed he does not have any other symptoms.

## 2024-04-13 ENCOUNTER — Telehealth: Payer: Self-pay

## 2024-04-13 ENCOUNTER — Encounter (HOSPITAL_COMMUNITY)
Admission: RE | Admit: 2024-04-13 | Discharge: 2024-04-13 | Disposition: A | Discharge status: Home / Self Care | Source: Ambulatory Visit | Attending: Pulmonary Disease | Admitting: Pulmonary Disease

## 2024-04-13 VITALS — Wt 170.0 lb

## 2024-04-13 DIAGNOSIS — J449 Chronic obstructive pulmonary disease, unspecified: Secondary | ICD-10-CM | POA: Diagnosis not present

## 2024-04-13 NOTE — Progress Notes (Signed)
 Daily Session Note  Patient Details  Name: Manuel Richardson MRN: [id number] Date of Birth: 1955/11/03 Referring Provider:   [name] Walk Test from 02/23/2024 in [facility]  Referring Provider [name]    Encounter Date: 04/13/2024  Check In:  Session Check In - 04/13/24 1326       Check-In   Supervising physician immediately available to respond to emergencies [facility] MD immediately available    Physician(s) Morey Ar, NP    Location [facility]    Staff Present Sueellen Emery BS, ACSM-CEP, Exercise Physiologist;Kaylee Nolon Baxter, MS, ACSM-CEP, Exercise Physiologist;Ladashia Demarinis Carmen Chol, RN, BSN    Virtual Visit No    Medication changes reported     No    Fall or balance concerns reported    No    Tobacco Cessation No Change    Warm-up and Cool-down Performed as group-led instruction    Resistance Training Performed Yes    VAD Patient? No    PAD/SET Patient? No      Pain Assessment   Currently in Pain? No/denies    Multiple Pain Sites No          Capillary Blood Glucose: No results found for this or any previous visit (from the past 24 hours).    Social History   Tobacco Use  Smoking Status Former   Current packs/day: 0.00   Average packs/day: 3.0 packs/day for 54.0 years (162.0 ttl pk-yrs)   Types: Cigarettes   Start date: 24   Quit date: 2024   Years since quitting: 1.4  Smokeless Tobacco Never  Tobacco Comments   Quit 12/28/2022 khj 09/17/2023   Smoked for 53 years.    Smoked 3 PPD at his heaviest    Goals Met:  Proper associated with RPD/PD & O2 Sat Independence with exercise equipment Exercise tolerated well No report of concerns or symptoms today Strength training completed today  Goals Unmet:  Not Applicable  Comments: Service time is from 1304 to 1435.    Dr. Genetta Kenning is Medical Director for Pulmonary Rehab at [facility].

## 2024-04-13 NOTE — Telephone Encounter (Signed)
 Appointment: -pt's sister Valinda Gault called stating her brother is having worsening pain in his leg describing that he is not able to walk as far as he was but he says his breathing he felt was better.  Valinda Gault called asking to have his appt moved up.   -Valinda Gault was advised that if pt develops a wound, or pain is worsens before his appt to please call the office.  If his leg becomes cold, discolored, or has intolerable pain to proceed to the ED.

## 2024-04-15 ENCOUNTER — Encounter (HOSPITAL_COMMUNITY)
Admission: RE | Admit: 2024-04-15 | Discharge: 2024-04-15 | Disposition: A | Discharge status: Home / Self Care | Source: Ambulatory Visit | Attending: Pulmonary Disease

## 2024-04-15 ENCOUNTER — Telehealth (HOSPITAL_COMMUNITY): Payer: Self-pay

## 2024-04-15 DIAGNOSIS — J449 Chronic obstructive pulmonary disease, unspecified: Secondary | ICD-10-CM

## 2024-04-15 NOTE — Telephone Encounter (Signed)
 Patient left message stating he is having issues with his leg, it was unclear if he was saying he had gone to the ED or was expecting to go to the ED. Stated he expects to have clearance from his doctor to return to pulmonary rehab soon, though we are unaware of any med hold for him.

## 2024-04-16 DIAGNOSIS — J439 Emphysema, unspecified: Secondary | ICD-10-CM | POA: Diagnosis not present

## 2024-04-16 DIAGNOSIS — J449 Chronic obstructive pulmonary disease, unspecified: Secondary | ICD-10-CM | POA: Diagnosis not present

## 2024-04-19 ENCOUNTER — Encounter: Payer: Self-pay | Admitting: Cardiology

## 2024-04-19 ENCOUNTER — Telehealth: Payer: Self-pay

## 2024-04-19 NOTE — Telephone Encounter (Signed)
 Pt's sister, Joen, called [facility] triage line again asking for an earlier appt for pt because of continuing right leg claudication. Pt reported pain only when walking, no rest pain and no non-healing ulcers. Pt advised to remain as active as tolerable. Pt advised to keep 05/31/24 appts  Pt advised to call back if he develops rest pain, non-healing ulcers, change in temperature or color of leg and/or foot.

## 2024-04-20 ENCOUNTER — Other Ambulatory Visit (HOSPITAL_COMMUNITY): Payer: Self-pay

## 2024-04-20 ENCOUNTER — Other Ambulatory Visit: Payer: Self-pay

## 2024-04-20 ENCOUNTER — Encounter: Payer: Self-pay | Admitting: Cardiology

## 2024-04-20 ENCOUNTER — Telehealth: Payer: Self-pay | Admitting: Pharmacy Technician

## 2024-04-20 ENCOUNTER — Encounter (HOSPITAL_COMMUNITY): Admission: RE | Admit: 2024-04-20 | Source: Ambulatory Visit

## 2024-04-20 MED ORDER — CLOPIDOGREL BISULFATE 75 MG PO TABS: 75.0000 mg | ORAL_TABLET | Freq: Every day | ORAL | 3 refills | Status: DC

## 2024-04-20 NOTE — Telephone Encounter (Signed)
 Ranexa  is for anginal symptoms.  Please see if GoodRx card could reduce the cost.  We can stop the medications and see how you symptoms are without it, if no other options for cost reduction available.  In future, we could consider going up on metoprolol  dose if needed.  Thanks MJP

## 2024-04-20 NOTE — Telephone Encounter (Signed)
     Nothing on [employer] grant for ranolazine /ranexa  either

## 2024-04-20 NOTE — Telephone Encounter (Signed)
 Called pt's sister to discuss MyChart messages.  Pt's sister is requesting Dr. Elmira to fill out a tier reduction form to decrease that cost for the pt. It has to come from the doctor, I do not have the form  She states the pt is on a fixed income.   MyChart message sent about this matter.

## 2024-04-20 NOTE — Telephone Encounter (Signed)
 Please let me know what I need to sign I will be happy to do so.  Thanks MJP

## 2024-04-21 ENCOUNTER — Telehealth (HOSPITAL_COMMUNITY): Payer: Self-pay | Admitting: *Deleted

## 2024-04-21 NOTE — Telephone Encounter (Signed)
 LVM to check in on pt since he has missed 2 sessions.  Aliene Aris BS, ACSM-CEP 04/21/2024 12:51 PM

## 2024-04-21 NOTE — Telephone Encounter (Signed)
 Thank you all  Thanks MJP

## 2024-04-22 ENCOUNTER — Telehealth (HOSPITAL_COMMUNITY): Payer: Self-pay | Admitting: *Deleted

## 2024-04-22 ENCOUNTER — Encounter (HOSPITAL_COMMUNITY): Admission: RE | Admit: 2024-04-22 | Source: Ambulatory Visit

## 2024-04-22 NOTE — Telephone Encounter (Signed)
 Spoke with Joen regarding pts lack of attendance. She voices that pt wanted to drop from PR given his ongoing claudication pain. He had called to discuss this however it was not completely clear that he wanted to drop. Will cancel appts going forward.  Aliene Aris BS, ACSM-CEP 04/22/2024 3:32 PM

## 2024-04-23 NOTE — Progress Notes (Signed)
 Discharge Progress Report  Patient Details  Name: Manuel Richardson MRN: [id number] Date of Birth: 1956-02-27 Referring Provider:   Conrad Ports [facility] Rehab Walk Test from 02/23/2024 in [facility]  Referring Provider Tamea     Number of Visits: 10  Reason for Discharge:  Early Exit:  Personal  Smoking History:  Social History   Tobacco Use  Smoking Status Former   Current packs/day: 0.00   Average packs/day: 3.0 packs/day for 54.0 years (162.0 ttl pk-yrs)   Types: Cigarettes   Start date: 76   Quit date: 2024   Years since quitting: 1.4  Smokeless Tobacco Never  Tobacco Comments   Quit 12/28/2022 khj 09/17/2023   Smoked for 53 years.    Smoked 3 PPD at his heaviest    Diagnosis:  Stage 3 severe COPD by GOLD classification (HCC)  ADL UCSD:  Pulmonary Assessment Scores     Row Name 02/23/24 1332         ADL UCSD   ADL Phase Entry     SOB Score total 53       CAT Score   CAT Score 15       mMRC Score   mMRC Score 4        Initial Exercise Prescription:  Initial Exercise Prescription - 02/23/24 1500       Date of Initial Exercise RX and Referring Provider   Date 02/23/24    Referring Provider Tamea    Expected Discharge Date 05/20/24      Oxygen    Oxygen  Continuous    Liters 1    Maintain Oxygen  Saturation 88% or higher      NuStep   Level 2    SPM 70    Minutes 15    METs 2.5      Track   Laps 8    Minutes 15    METs 2.5      Prescription Details   Frequency (times per week) 2    Duration Progress to 30 minutes of continuous aerobic without signs/symptoms of physical distress      Intensity   THRR 40-80% of Max Heartrate 61-122    Ratings of Perceived Exertion 11-13    Perceived Dyspnea 0-4      Progression   Progression Continue to progress workloads to maintain intensity without signs/symptoms of physical distress.      Resistance Training   Training  Prescription Yes    Weight black bands    Reps 10-15          Discharge Exercise Prescription (Final Exercise Prescription Changes):  Exercise Prescription Changes - 04/13/24 1408       Response to Exercise   Blood Pressure (Admit) 110/70    Blood Pressure (Exit) 102/70    Heart Rate (Admit) 78 bpm    Heart Rate (Exercise) 108 bpm    Heart Rate (Exit) 98 bpm    Oxygen  Saturation (Admit) 95 %   1l   Oxygen  Saturation (Exercise) 91 %   1L   Oxygen  Saturation (Exit) 97 %   RA   Rating of Perceived Exertion (Exercise) 10    Perceived Dyspnea (Exercise) 1    Duration Continue with 30 min of aerobic exercise without signs/symptoms of physical distress.    Intensity THRR unchanged      Progression   Progression Continue to progress workloads to maintain intensity without signs/symptoms of physical distress.  Resistance Training   Training Prescription Yes    Weight black bands    Reps 10-15    Time 10 Minutes      Oxygen    Oxygen  Continuous    Liters 0-1      NuStep   Level 4    Minutes 15    METs 3      Track   Laps 10    Minutes 15    METs 2.25      Oxygen    Maintain Oxygen  Saturation 88% or higher          Functional Capacity:  6 Minute Walk     Row Name 02/23/24 1435         6 Minute Walk   Phase Initial     Distance 840 feet     Walk Time 6 minutes     # of Rest Breaks 1  claudication at 4 min, rest 3:58-6:00     MPH 1.59     METS 2.5     RPE 11     Perceived Dyspnea  2     VO2 Peak 8.76     Symptoms Yes (comment)     Comments had to rest due to claudication. Also needed 1L after 4 min     Resting HR 66 bpm     Resting BP 120/60     Resting Oxygen  Saturation  93 %     Exercise Oxygen  Saturation  during 6 min walk 86 %     Max Ex. HR 102 bpm     Max Ex. BP 146/70     2 Minute Post BP 138/64       Interval HR   1 Minute HR 75     2 Minute HR 91     3 Minute HR 97     4 Minute HR 102     5 Minute HR 91     6 Minute HR 76     2  Minute Post HR 56     Interval Heart Rate? Yes       Interval Oxygen    Interval Oxygen ? Yes     Baseline Oxygen  Saturation % 93 %     1 Minute Oxygen  Saturation % 94 %     1 Minute Liters of Oxygen  0 L     2 Minute Oxygen  Saturation % 90 %     2 Minute Liters of Oxygen  0 L     3 Minute Oxygen  Saturation % 88 %     3 Minute Liters of Oxygen  0 L     4 Minute Oxygen  Saturation % 87 %     4 Minute Liters of Oxygen  0 L     5 Minute Oxygen  Saturation % 89 %     5 Minute Liters of Oxygen  1 L  SpO2 down to 86 RA, applied 1L     6 Minute Oxygen  Saturation % 96 %     6 Minute Liters of Oxygen  1 L     2 Minute Post Oxygen  Saturation % 99 %     2 Minute Post Liters of Oxygen  1 L        Psychological, QOL, Others - Outcomes: PHQ 2/9:    02/23/2024    1:29 PM 09/21/2018    9:47 AM  Depression screen PHQ 2/9  Decreased Interest 0 0  Down, Depressed, Hopeless 0 0  PHQ - 2 Score 0 0  Altered sleeping 0  Tired, decreased energy 2   Change in appetite 0   Feeling bad or failure about yourself  0   Trouble concentrating 0   Moving slowly or fidgety/restless 0   Suicidal thoughts 0   PHQ-9 Score 2   Difficult doing work/chores Somewhat difficult     Quality of Life:   Personal Goals: Goals established at orientation with interventions provided to work toward goal.  Personal Goals and Risk Factors at Admission - 02/23/24 1334       Core Components/Risk Factors/Patient Goals on Admission   Improve shortness of breath with ADL's Yes    Intervention Provide education, individualized exercise plan and daily activity instruction to help decrease symptoms of SOB with activities of daily living.    Expected Outcomes Short Term: Improve cardiorespiratory fitness to achieve a reduction of symptoms when performing ADLs;Long Term: Be able to perform more ADLs without symptoms or delay the onset of symptoms           Personal Goals Discharge:  Goals and Risk Factor Review     Row Name  03/04/24 9082 04/05/24 1521 04/23/24 0919         Core Components/Risk Factors/Patient Goals Review   Personal Goals Review Improve shortness of breath with ADL's;Develop more efficient breathing techniques such as purse lipped breathing and diaphragmatic breathing and practicing self-pacing with activity. Improve shortness of breath with ADL's;Develop more efficient breathing techniques such as purse lipped breathing and diaphragmatic breathing and practicing self-pacing with activity. Improve shortness of breath with ADL's     Review Monthly review of patient's Core Components/Risk Factors/Patient Goals are as follows: Unable to assess progress. Jonuel has attended 1 class so far. Monthly review of patient's Core Components/Risk Factors/Patient Goals are as follows: Monthly review of patient's Core Components/Risk Factors/Patient Goals are as follows: Goal progressing for improving shortness of breath with ADL's. Tahji is currently able to maintain sats >88% on 1L [place] while exercising on the NuStep and walking the track. He is building up his stamina to get back to the things he once used to do. Goal met for developing more efficient breathing techniques such as purse lipped breathing and diaphragmatic breathing; and practicing self-pacing with activity. Rivan is practicing his PLB and can initiate this technique independently. He knows how to slow down his pace or stop when dyspneic. He is practicing diaphragmatic breathing at home, before bed. We will continue to monitor Ladarren's progress throughout the program. He will continue to benefit from PR for nutrition, education, exercise, and lifestyle modification. Ridwan was discharged from the PR program on 04/22/24 due to severe ongoing claudication pain. Goal for improving SOB with ADL's was met.     Expected Outcomes Pt will show progress toward meeting expected goals and outcomes. Pt will show progress toward meeting expected goals and outcomes. To continue  to exercise and modify his nutrition and lifestyle post discharge        Exercise Goals and Review:  Exercise Goals     Row Name 02/23/24 1334             Exercise Goals   Increase Physical Activity Yes       Intervention Provide advice, education, support and counseling about physical activity/exercise needs.;Develop an individualized exercise prescription for aerobic and resistive training based on initial evaluation findings, risk stratification, comorbidities and participant's personal goals.       Expected Outcomes Short Term: Attend rehab on a regular basis to increase amount of physical activity.;Long Term:  Add in home exercise to make exercise part of routine and to increase amount of physical activity.;Long Term: Exercising regularly at least 3-5 days a week.       Increase Strength and Stamina Yes       Intervention Provide advice, education, support and counseling about physical activity/exercise needs.;Develop an individualized exercise prescription for aerobic and resistive training based on initial evaluation findings, risk stratification, comorbidities and participant's personal goals.       Expected Outcomes Short Term: Increase workloads from initial exercise prescription for resistance, speed, and METs.;Short Term: Perform resistance training exercises routinely during rehab and add in resistance training at home;Long Term: Improve cardiorespiratory fitness, muscular endurance and strength as measured by increased METs and functional capacity ( )       Able to understand and use rate of perceived exertion (RPE) scale Yes       Intervention Provide education and explanation on how to use RPE scale       Expected Outcomes Short Term: Able to use RPE daily in rehab to express subjective intensity level;Long Term:  Able to use RPE to guide intensity level when exercising independently       Able to understand and use Dyspnea scale Yes       Intervention Provide education and  explanation on how to use Dyspnea scale       Expected Outcomes Short Term: Able to use Dyspnea scale daily in rehab to express subjective sense of shortness of breath during exertion;Long Term: Able to use Dyspnea scale to guide intensity level when exercising independently       Knowledge and understanding of Target Heart Rate Range (THRR) Yes       Intervention Provide education and explanation of THRR including how the numbers were predicted and where they are located for reference       Expected Outcomes Short Term: Able to state/look up THRR;Long Term: Able to use THRR to govern intensity when exercising independently;Short Term: Able to use daily as guideline for intensity in rehab       Understanding of Exercise Prescription Yes       Intervention Provide education, explanation, and written materials on patient's individual exercise prescription       Expected Outcomes Short Term: Able to explain program exercise prescription;Long Term: Able to explain home exercise prescription to exercise independently          Exercise Goals Re-Evaluation:  Exercise Goals Re-Evaluation     Row Name 03/02/24 0847 03/29/24 1111 04/05/24 0853 04/23/24 0702       Exercise Goal Re-Evaluation   Exercise Goals Review Increase Physical Activity;Able to understand and use Dyspnea scale;Understanding of Exercise Prescription;Increase Strength and Stamina;Knowledge and understanding of Target Heart Rate Range (THRR);Able to understand and use rate of perceived exertion (RPE) scale Increase Physical Activity;Able to understand and use Dyspnea scale;Understanding of Exercise Prescription;Increase Strength and Stamina;Knowledge and understanding of Target Heart Rate Range (THRR);Able to understand and use rate of perceived exertion (RPE) scale (P)  Increase Physical Activity;Able to understand and use Dyspnea scale;Understanding of Exercise Prescription;Increase Strength and Stamina;Knowledge and understanding of  Target Heart Rate Range (THRR);Able to understand and use rate of perceived exertion (RPE) scale Increase Physical Activity;Able to understand and use Dyspnea scale;Understanding of Exercise Prescription;Increase Strength and Stamina;Knowledge and understanding of Target Heart Rate Range (THRR);Able to understand and use rate of perceived exertion (RPE) scale    Comments Pt is scheduled to begin exercise 5/6. Will progress as tolerated. Pt  is scheduled to begin exercise 5/6. Will progress as tolerated. (P)  Pt has completed 6 exercise sessions. He is now walking on the treadmill (started on the track), 15 min, 1.5 mph, 0 incline, METs 2.0. He requires 1-2 rest stops due to claudication. He then exercises on the recumbent stepper for 15 min, level 3, METs 2.7. He is using black bands, 7.3 lbs. Will progress as tolerated. We are encouraging him to do the PAD program after PR. Pt completed 10 exercise sessions. He decided to discharge due to ongoing claudication pain, even though we discussed the benefits of exercise. He reached 3.1 METs on the recumbent stepper.    Expected Outcomes Through exercise in rehab and at home, the patient will decrease shortness of breath with daily activities and feel confident in doing an exercise regimen at home. Through exercise in rehab and at home, the patient will decrease shortness of breath with daily activities and feel confident in doing an exercise regimen at home. (P)  Through exercise in rehab and at home, the patient will decrease shortness of breath with daily activities and feel confident in doing an exercise regimen at home. Through exercise in rehab and at home, the patient will decrease shortness of breath with daily activities and feel confident in doing an exercise regimen at home.       Nutrition & Weight - Outcomes:    Nutrition:  Nutrition Therapy & Goals - 04/01/24 1351       Nutrition Therapy   Diet Heart Healthy Diet    Drug/Food Interactions  Statins/Certain Fruits      Personal Nutrition Goals   Nutrition Goal Patient to improve diet quality by using the plate method as a guide for meal planning to include lean protein/plant protein, fruits, vegetables, whole grains, nonfat dairy as part of a well-balanced diet.    Comments Goal in progress. Patient has medical history of hyperlipidemia, CAD, HTN, PAD, COPD3. Triglycerides remain elevated, A1c is in a prediabetic range. He reports stable appetite/eating patterns and no nutrition concerns at this time. He has maintained his weight since starting with our program. Patient will benefit from participation in pulmonary rehab for nutrition, exercise, and lifestyle modification.      Intervention Plan   Intervention Prescribe, educate and counsel regarding individualized specific dietary modifications aiming towards targeted core components such as weight, hypertension, lipid management, diabetes, heart failure and other comorbidities.;Nutrition handout(s) given to patient.    Expected Outcomes Short Term Goal: Understand basic principles of dietary content, such as calories, fat, sodium, cholesterol and nutrients.;Long Term Goal: Adherence to prescribed nutrition plan.          Nutrition Discharge:   Education Questionnaire Score:  Knowledge Questionnaire Score - 02/23/24 1344       Knowledge Questionnaire Score   Pre Score 15/18          Goals reviewed with patient; copy given to patient.

## 2024-04-27 ENCOUNTER — Telehealth: Payer: Self-pay | Admitting: Pharmacist

## 2024-04-27 ENCOUNTER — Encounter (HOSPITAL_COMMUNITY)

## 2024-04-27 NOTE — Telephone Encounter (Signed)
 Submitted a Prior Authorization request to [facility] [employer] for OHTUVAYRE  via CoverMyMeds. Will update once we receive a response.  Key: [id number]  Sherry Pennant, PharmD, MPH, BCPS, CPP Clinical Pharmacist (Rheumatology and Pulmonology)

## 2024-04-28 NOTE — Telephone Encounter (Signed)
 Received notification from [employer] regarding a prior authorization for OHTUVAYRE . Authorization has been APPROVED from 04/27/2024 to 10/27/2024. Approval letter sent to scan center.  Authorization # [id number]  Sherry Pennant, PharmD, MPH, BCPS, CPP Clinical Pharmacist (Rheumatology and Pulmonology)

## 2024-04-29 ENCOUNTER — Encounter (HOSPITAL_COMMUNITY)

## 2024-05-04 ENCOUNTER — Encounter (HOSPITAL_COMMUNITY)

## 2024-05-05 ENCOUNTER — Ambulatory Visit: Admitting: Cardiology

## 2024-05-06 ENCOUNTER — Encounter (HOSPITAL_COMMUNITY)

## 2024-05-11 ENCOUNTER — Encounter (HOSPITAL_COMMUNITY)

## 2024-05-13 ENCOUNTER — Encounter (HOSPITAL_COMMUNITY)

## 2024-05-14 ENCOUNTER — Ambulatory Visit (HOSPITAL_COMMUNITY)
Admission: RE | Admit: 2024-05-14 | Discharge: 2024-05-14 | Disposition: A | Discharge status: Home / Self Care | Source: Ambulatory Visit | Attending: Surgery | Admitting: Surgery

## 2024-05-14 ENCOUNTER — Ambulatory Visit (HOSPITAL_BASED_OUTPATIENT_CLINIC_OR_DEPARTMENT_OTHER)
Admission: RE | Admit: 2024-05-14 | Discharge: 2024-05-14 | Disposition: A | Discharge status: Home / Self Care | Source: Ambulatory Visit | Attending: Surgery | Admitting: Surgery

## 2024-05-14 DIAGNOSIS — I6529 Occlusion and stenosis of unspecified carotid artery: Secondary | ICD-10-CM

## 2024-05-14 DIAGNOSIS — I70213 Atherosclerosis of native arteries of extremities with intermittent claudication, bilateral legs: Secondary | ICD-10-CM | POA: Insufficient documentation

## 2024-05-14 LAB — VAS US ABI WITH/WO TBI
Left ABI: 0.89
Right ABI: 0.56

## 2024-05-16 DIAGNOSIS — J439 Emphysema, unspecified: Secondary | ICD-10-CM | POA: Diagnosis not present

## 2024-05-16 DIAGNOSIS — J449 Chronic obstructive pulmonary disease, unspecified: Secondary | ICD-10-CM | POA: Diagnosis not present

## 2024-05-18 ENCOUNTER — Encounter (HOSPITAL_COMMUNITY)

## 2024-05-20 ENCOUNTER — Encounter (HOSPITAL_COMMUNITY)

## 2024-05-31 ENCOUNTER — Encounter (HOSPITAL_COMMUNITY)

## 2024-05-31 ENCOUNTER — Ambulatory Visit: Admitting: Surgery

## 2024-06-01 ENCOUNTER — Ambulatory Visit: Admitting: Pulmonary Disease

## 2024-06-03 ENCOUNTER — Ambulatory Visit: Attending: Cardiology | Admitting: Cardiology

## 2024-06-03 ENCOUNTER — Encounter: Payer: Self-pay | Admitting: Cardiology

## 2024-06-03 VITALS — BP 112/74 | HR 85 | Ht 68.0 in | Wt 168.8 lb

## 2024-06-03 DIAGNOSIS — I739 Peripheral vascular disease, unspecified: Secondary | ICD-10-CM | POA: Diagnosis not present

## 2024-06-03 DIAGNOSIS — I25118 Atherosclerotic heart disease of native coronary artery with other forms of angina pectoris: Secondary | ICD-10-CM

## 2024-06-03 DIAGNOSIS — E782 Mixed hyperlipidemia: Secondary | ICD-10-CM

## 2024-06-03 NOTE — Progress Notes (Unsigned)
 Cardiology Office Note:  .   Date:  06/03/2024  ID:  Manuel Richardson, DOB Oct 09, 1956, MRN [id number] PCP: Valentin Skates, DO  [facility] Providers Cardiologist:  Manuel Lawrence, MD PCP: Valentin Skates, DO  Chief Complaint  Patient presents with   Coronary Artery Disease      History of Present Illness: .    Manuel Richardson is a 68 y.o. 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   Patient had unsuccessful attempt at RCA CTO intervention in 01/2024. His exertional dyspnea has remained. He was undergoing pulmonary rehab, but was unable to complete due to his severe claudication symptoms in Rt leg. 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 He is seeing Dr. Serene for this.     Vitals:   06/03/24 0950  BP: 112/74  Pulse: 85  SpO2: 93%        ROS:  Review of Systems  Cardiovascular:  Positive for claudication and dyspnea on exertion. Negative for chest pain, leg swelling, palpitations and syncope.     Studies Reviewed: Manuel Richardson       EKG 01/05/2024: Normal sinus rhythm with sinus arrhythmia Cannot rule out Inferior infarct (cited on or before 30-Jul-2023) When compared with ECG of 10-Nov-2023 08:39, No significant change was found   Labs 01/2024: Chol 178, TG 237, HDL 49, LDL 89 Hb 12.2 Cr 9.01  07/2023: HbA1C 6.2%   Physical Exam:   Physical Exam Vitals and nursing note reviewed.  Constitutional:      General: He is not in acute distress. Neck:     Vascular: No JVD.  Cardiovascular:     Rate and Rhythm: Normal rate and regular rhythm.     Pulses: Decreased pulses.          Dorsalis pedis pulses are 1+ on the right side and 1+ on the left side.       Posterior tibial pulses are 1+ on the right side and 0 on the left side.     Heart sounds: Normal heart  sounds. No murmur heard. Pulmonary:     Effort: Pulmonary effort is normal.     Breath sounds: Normal breath sounds. No wheezing or rales.  Musculoskeletal:     Right lower leg: No edema.     Left lower leg: No edema.      VISIT DIAGNOSES: No diagnosis found.     ASSESSMENT AND PLAN: .    Manuel Richardson is a 68 y.o. 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   *** Exertional dyspnea, CAD: RCA territory ischemia on PET/CT stress test.  Unsuccessful revascularization attempt by Dr. [place] 01/2024. Exertional dyspnea likely combination of CAD as well as COPD. Recommend follow-up with pulmonology, consideration of pulmonary rehab. From cardiac standpoint, will add Ranexa  500 mg twice daily. Off Plavix  since unsuccessful CTO PCI attempt.  Continue Aspirin  81 mg daily. Continue metoprolol  succinate 50 mg daily, amlodipine  5 mg daily. Continue Crestor  40 mg daily. Check lipid panel in May 2025.  If LDL remains >70, he will need PCSK9 elevator or Leqvio.Manuel Richardson  PAD: Prior left carotid-subclavian bypass. Occluded right iliac artery, moderate distal aorta stenosis. Fortunately critical ischemia at this time, does have  severely reduced ABI. He is seeing Dr. Serene tomorrow for consultation. Continue current medical management including Aspirin , statin, losartan .  Hypertension: Controlled.  H/o Afib: Reported episode after inhalation of albuterol  in 2022. He had no symptoms then and remains unclear if he had any recurrence since then. He wore Zio patch in 2022 through [facility] that did not show Afib. He is absolutely not in favor ir restarting Eliquis  or any other anticoagulation.  Unless he has any recurrent A-fib, we would hold off initiating anticoagulation at this time.  No orders of the defined  types were placed in this encounter.    F/u in 3 months   Signed, Manuel JINNY Lawrence, MD

## 2024-06-03 NOTE — Patient Instructions (Signed)
 Medication Instructions:  STOP Aspirin    *If you need a refill on your cardiac medications before your next appointment, please call your pharmacy*  Lab Work: Lipid panel   If you have labs (blood work) drawn today and your tests are completely normal, you will receive your results only by: MyChart Message (if you have MyChart) OR A paper copy in the mail If you have any lab test that is abnormal or we need to change your treatment, we will call you to review the results.   Follow-Up: At [facility], you and your health needs are our priority.  As part of our continuing mission to provide you with exceptional heart care, our providers are all part of one team.  This team includes your primary Cardiologist (physician) and Advanced Practice Providers or APPs (Physician Assistants and Nurse Practitioners) who all work together to provide you with the care you need, when you need it.  Your next appointment:   6 month(s)  Provider:   Newman JINNY Lawrence, MD

## 2024-06-04 ENCOUNTER — Ambulatory Visit: Payer: Self-pay | Admitting: Cardiology

## 2024-06-04 ENCOUNTER — Encounter: Payer: Self-pay | Admitting: Cardiology

## 2024-06-04 LAB — LIPID PANEL
Chol/HDL Ratio: 2.8 ratio (ref 0.0–5.0)
Cholesterol, Total: 125 mg/dL (ref 100–199)
HDL: 45 mg/dL (ref >39)
LDL Chol Calc (NIH): 52 mg/dL (ref 0–99)
Triglycerides: 169 mg/dL — ABNORMAL HIGH (ref 0–149)
VLDL Cholesterol Cal: 28 mg/dL (ref 5–40)

## 2024-06-14 ENCOUNTER — Telehealth: Payer: Self-pay | Admitting: Cardiology

## 2024-06-14 ENCOUNTER — Ambulatory Visit: Attending: Surgery | Admitting: Surgery

## 2024-06-14 ENCOUNTER — Telehealth: Payer: Self-pay

## 2024-06-14 ENCOUNTER — Encounter: Payer: Self-pay | Admitting: Surgery

## 2024-06-14 ENCOUNTER — Telehealth (HOSPITAL_BASED_OUTPATIENT_CLINIC_OR_DEPARTMENT_OTHER): Payer: Self-pay

## 2024-06-14 VITALS — BP 126/75 | HR 107 | Temp 98.0°F | Resp 24 | Ht 68.0 in | Wt 168.1 lb

## 2024-06-14 DIAGNOSIS — I70213 Atherosclerosis of native arteries of extremities with intermittent claudication, bilateral legs: Secondary | ICD-10-CM | POA: Diagnosis not present

## 2024-06-14 NOTE — Progress Notes (Signed)
 [facility] [place]  Patient name: Manuel Richardson MRN: [id number] DOB: 1956/01/28 Sex: male   REASON FOR VISIT:    Follow-up  HISOTRY OF PRESENT ILLNESS:   Manuel Richardson is a 68 y.o. male, who is referred for evaluation of right leg claudication.  He has a history of left carotid subclavian bypass graft by myself in 2012 for left arm claudication which has resolved.  He states that he gets very short distance right leg claudication at approximately 30 feet.  However, most of his walking is limited by his pulmonary disease.  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  I felt that the best option to treat his disease would be a AUI with left to right femoral-femoral bypass graft.  The patient was not interested in pursuing this when I last saw him as he was able to tolerate his level of disability.  He is back today for follow-up.  He now states that his right leg is a big source of discomfort and it bothers him more than his breathing.  He has gone to wearing oxygen  most of the day.  He does not have any open wounds     Patient is medically managed for hypertension with an ARB.  He has prediabetes.  He takes a statin for hypercholesterolemia.  He has a prior history of A-fib.  He is not interested in anticoagulation he has known coronary artery disease, status post unsuccessful attempt at treating a RCA occlusion he has exertional dyspnea.  He is a former smoker   PAST MEDICAL HISTORY:   Past Medical History:  Diagnosis Date   Carotid artery occlusion    COPD (chronic obstructive pulmonary disease) (HCC)    Erectile dysfunction    Full dentures    GERD (gastroesophageal reflux disease)    Hyperlipidemia    Hypertension    Tobacco use disorder      FAMILY HISTORY:   Family History  Problem Relation Age of Onset   Heart disease Mother        died 26 with  MI   Cancer Father        lung cancer   Asthma Father    Lung cancer Father        smoked   Hypertension Brother    Diabetes Neg Hx    Stroke Neg Hx     SOCIAL HISTORY:   Social History   Tobacco Use   Smoking status: Former    Current packs/day: 0.00    Average packs/day: 3.0 packs/day for 54.0 years (162.0 ttl pk-yrs)    Types: Cigarettes    Start date: 54    Quit date: 2024    Years since quitting: 1.6   Smokeless tobacco: Never   Tobacco comments:    Quit 12/28/2022 khj 09/17/2023    Smoked for 53 years.     Smoked 3 PPD at his heaviest  Substance Use Topics   Alcohol use: Yes    Alcohol/week: 12.0 standard drinks of alcohol    Types: 12 Cans of beer per week    Comment: occ     ALLERGIES:   No Known Allergies   CURRENT MEDICATIONS:   Current Outpatient Medications  Medication Sig Dispense Refill   albuterol  (VENTOLIN  HFA) 108 (90 Base) MCG/ACT inhaler Inhale 2 puffs into the lungs every 6 (six) hours as needed for wheezing or shortness of breath.  8 g 2   amLODipine  (NORVASC ) 5 MG tablet Take 1 tablet (5 mg total) by mouth daily. 90 tablet 3   BREZTRI  AEROSPHERE 160-9-4.8 MCG/ACT AERO Inhale 2 puffs into the lungs 2 (two) times daily.     clopidogrel  (PLAVIX ) 75 MG tablet Take 1 tablet (75 mg total) by mouth daily. 90 tablet 3   ezetimibe  (ZETIA ) 10 MG tablet Take 1 tablet (10 mg total) by mouth daily. 90 tablet 3   losartan  (COZAAR ) 50 MG tablet Take 1 tablet (50 mg total) by mouth daily. 90 tablet 3   metoprolol  succinate (TOPROL  XL) 50 MG 24 hr tablet Take 1 tablet (50 mg total) by mouth daily. Take with or immediately following a meal. 90 tablet 2   nitroGLYCERIN  (NITROSTAT ) 0.4 MG SL tablet Place 1 tablet (0.4 mg total) under the tongue every 5 (five) minutes as needed for chest pain. 25 tablet 3   ranolazine  (RANEXA ) 500 MG 12 hr tablet Take 1 tablet (500 mg total) by mouth 2 (two) times daily. 180 tablet 2   rosuvastatin  (CRESTOR ) 40 MG tablet Take 1  tablet (40 mg total) by mouth daily. 90 tablet 3   No current facility-administered medications for this visit.    REVIEW OF SYSTEMS:   [X]  denotes positive finding, [ ]  denotes negative finding Cardiac  Comments:  Chest pain or chest pressure:    Shortness of breath upon exertion:    Short of breath when lying flat:    Irregular heart rhythm:        Vascular    Pain in calf, thigh, or hip brought on by ambulation:    Pain in feet at night that wakes you up from your sleep:     Blood clot in your veins:    Leg swelling:         Pulmonary    Oxygen  at home:    Productive cough:     Wheezing:         Neurologic    Sudden weakness in arms or legs:     Sudden numbness in arms or legs:     Sudden onset of difficulty speaking or slurred speech:    Temporary loss of vision in one eye:     Problems with dizziness:         Gastrointestinal    Blood in stool:     Vomited blood:         Genitourinary    Burning when urinating:     Blood in urine:        Psychiatric    Major depression:         Hematologic    Bleeding problems:    Problems with blood clotting too easily:        Skin    Rashes or ulcers:        Constitutional    Fever or chills:      PHYSICAL EXAM:   Vitals:   06/14/24 0926  BP: 126/75  Pulse: (!) 107  Resp: (!) 24  Temp: 98 F (36.7 C)  TempSrc: Temporal  SpO2: 90%  Weight: 168 lb 1.6 oz (76.2 kg)  Height: 5' 8 (1.727 m)    GENERAL: The patient is a well-nourished male, in no acute distress. The vital signs are documented above. CARDIAC: There is a regular rate and rhythm.  VASCULAR: Nonpalpable pedal pulses.  Palpable radial pulses bilaterally PULMONARY: Non-labored respirations ABDOMEN: Soft and non-tender with normal pitched bowel sounds.  MUSCULOSKELETAL: There are no major deformities or cyanosis. NEUROLOGIC: No focal weakness or paresthesias are detected. SKIN: There are no ulcers or rashes noted. PSYCHIATRIC: The patient has a  normal affect.  STUDIES:   I have reviewed the following: Carotid: Right Carotid: Evidence consistent with a total occlusion of the right  ICA.                Non-hemodynamically significant plaque <50% noted in the  CCA. The                 ECA appears >50% stenosed.   Left Carotid: Evidence consistent with a total occlusion of the left ICA.                Hemodynamically significant plaque >50% visualized in the  CCA. The                ECA appears <50% stenosed. Patent left subclavian subclavian                artery bypass graft.   Vertebrals:  Bilateral vertebral arteries demonstrate antegrade flow.  Subclavians: Normal flow hemodynamics were seen in the right subclavian  artery.    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  I have discussed proceeding with an AUI and femorofemoral to address his claudication.  This will also prevent having to go back in the future to address his aneurysm.  He will likely need an endarterectomy on the left.  I discussed the potential risks and benefits of surgery including wound issues leg swelling and cardiopulmonary complications.  Prior to proceeding with surgery he will need to get clearance from cardiology and pulmonary.  He will need to be off of his Plavix .   Carotid: His left carotid subclavian bypass graft is patent.  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  He is asymptomatic   Malvina Serene CLORE, MD, FACS [facility] [phone] Pager [phone]

## 2024-06-14 NOTE — Telephone Encounter (Signed)
 Copied from CRM #[id number]. Topic: Clinical - Medication Question >> Jun 14, 2024 11:06 AM Rozanna MATSU wrote: Reason for CRM: LYNN WITH VASCULAR IS INQUIRING ABOUT PT NEEDING SURGERY CLEARANCE. PT HAS APPT ON 10/23 BUT THEY ARE NEEDING CLEARANCE BEFORE THEN. PT IS HAVING TRIPLE A REPAIR AND FEMFEM BYPASS GRAPH. CALL AND SPEAK WITH AMANDA [phone] EXT [phone]

## 2024-06-14 NOTE — Telephone Encounter (Signed)
   Pre-operative Risk Assessment    Patient Name: ANDRUE DINI  DOB: [birth year] MRN: [id number]      Request for Surgical Clearance    Procedure:  AAA repair, Fem Fem bypass graft  Date of Surgery:  Clearance TBD                                 Surgeon:  Dr. Serene Surgeon's Group or Practice Name:  [facility] Phone number:  [phone] Fax number:  [phone]   Type of Clearance Requested:   - Medical    Type of Anesthesia:  General    Additional requests/questions:  Waiting on clearance to schedule  Signed, Rojelio Kays   06/14/2024, 10:49 AM

## 2024-06-14 NOTE — Telephone Encounter (Signed)
 Per Dr. Serene, patient needs cardiac and pulmonary clearance prior to scheduling of EVAR w/ [place] and L to R Fem-Fem Bypass.  Clearance request submitted by L.Stockard.

## 2024-06-14 NOTE — H&P (View-Only) (Signed)
 [facility] [place]  Patient name: Manuel Richardson MRN: [id number] DOB: 1956/01/28 Sex: male   REASON FOR VISIT:    Follow-up  HISOTRY OF PRESENT ILLNESS:   Manuel Richardson is a 68 y.o. male, who is referred for evaluation of right leg claudication.  He has a history of left carotid subclavian bypass graft by myself in 2012 for left arm claudication which has resolved.  He states that he gets very short distance right leg claudication at approximately 30 feet.  However, most of his walking is limited by his pulmonary disease.  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  I felt that the best option to treat his disease would be a AUI with left to right femoral-femoral bypass graft.  The patient was not interested in pursuing this when I last saw him as he was able to tolerate his level of disability.  He is back today for follow-up.  He now states that his right leg is a big source of discomfort and it bothers him more than his breathing.  He has gone to wearing oxygen  most of the day.  He does not have any open wounds     Patient is medically managed for hypertension with an ARB.  He has prediabetes.  He takes a statin for hypercholesterolemia.  He has a prior history of A-fib.  He is not interested in anticoagulation he has known coronary artery disease, status post unsuccessful attempt at treating a RCA occlusion he has exertional dyspnea.  He is a former smoker   PAST MEDICAL HISTORY:   Past Medical History:  Diagnosis Date   Carotid artery occlusion    COPD (chronic obstructive pulmonary disease) (HCC)    Erectile dysfunction    Full dentures    GERD (gastroesophageal reflux disease)    Hyperlipidemia    Hypertension    Tobacco use disorder      FAMILY HISTORY:   Family History  Problem Relation Age of Onset   Heart disease Mother        died 26 with  MI   Cancer Father        lung cancer   Asthma Father    Lung cancer Father        smoked   Hypertension Brother    Diabetes Neg Hx    Stroke Neg Hx     SOCIAL HISTORY:   Social History   Tobacco Use   Smoking status: Former    Current packs/day: 0.00    Average packs/day: 3.0 packs/day for 54.0 years (162.0 ttl pk-yrs)    Types: Cigarettes    Start date: 54    Quit date: 2024    Years since quitting: 1.6   Smokeless tobacco: Never   Tobacco comments:    Quit 12/28/2022 khj 09/17/2023    Smoked for 53 years.     Smoked 3 PPD at his heaviest  Substance Use Topics   Alcohol use: Yes    Alcohol/week: 12.0 standard drinks of alcohol    Types: 12 Cans of beer per week    Comment: occ     ALLERGIES:   No Known Allergies   CURRENT MEDICATIONS:   Current Outpatient Medications  Medication Sig Dispense Refill   albuterol  (VENTOLIN  HFA) 108 (90 Base) MCG/ACT inhaler Inhale 2 puffs into the lungs every 6 (six) hours as needed for wheezing or shortness of breath.  8 g 2   amLODipine  (NORVASC ) 5 MG tablet Take 1 tablet (5 mg total) by mouth daily. 90 tablet 3   BREZTRI  AEROSPHERE 160-9-4.8 MCG/ACT AERO Inhale 2 puffs into the lungs 2 (two) times daily.     clopidogrel  (PLAVIX ) 75 MG tablet Take 1 tablet (75 mg total) by mouth daily. 90 tablet 3   ezetimibe  (ZETIA ) 10 MG tablet Take 1 tablet (10 mg total) by mouth daily. 90 tablet 3   losartan  (COZAAR ) 50 MG tablet Take 1 tablet (50 mg total) by mouth daily. 90 tablet 3   metoprolol  succinate (TOPROL  XL) 50 MG 24 hr tablet Take 1 tablet (50 mg total) by mouth daily. Take with or immediately following a meal. 90 tablet 2   nitroGLYCERIN  (NITROSTAT ) 0.4 MG SL tablet Place 1 tablet (0.4 mg total) under the tongue every 5 (five) minutes as needed for chest pain. 25 tablet 3   ranolazine  (RANEXA ) 500 MG 12 hr tablet Take 1 tablet (500 mg total) by mouth 2 (two) times daily. 180 tablet 2   rosuvastatin  (CRESTOR ) 40 MG tablet Take 1  tablet (40 mg total) by mouth daily. 90 tablet 3   No current facility-administered medications for this visit.    REVIEW OF SYSTEMS:   [X]  denotes positive finding, [ ]  denotes negative finding Cardiac  Comments:  Chest pain or chest pressure:    Shortness of breath upon exertion:    Short of breath when lying flat:    Irregular heart rhythm:        Vascular    Pain in calf, thigh, or hip brought on by ambulation:    Pain in feet at night that wakes you up from your sleep:     Blood clot in your veins:    Leg swelling:         Pulmonary    Oxygen  at home:    Productive cough:     Wheezing:         Neurologic    Sudden weakness in arms or legs:     Sudden numbness in arms or legs:     Sudden onset of difficulty speaking or slurred speech:    Temporary loss of vision in one eye:     Problems with dizziness:         Gastrointestinal    Blood in stool:     Vomited blood:         Genitourinary    Burning when urinating:     Blood in urine:        Psychiatric    Major depression:         Hematologic    Bleeding problems:    Problems with blood clotting too easily:        Skin    Rashes or ulcers:        Constitutional    Fever or chills:      PHYSICAL EXAM:   Vitals:   06/14/24 0926  BP: 126/75  Pulse: (!) 107  Resp: (!) 24  Temp: 98 F (36.7 C)  TempSrc: Temporal  SpO2: 90%  Weight: 168 lb 1.6 oz (76.2 kg)  Height: 5' 8 (1.727 m)    GENERAL: The patient is a well-nourished male, in no acute distress. The vital signs are documented above. CARDIAC: There is a regular rate and rhythm.  VASCULAR: Nonpalpable pedal pulses.  Palpable radial pulses bilaterally PULMONARY: Non-labored respirations ABDOMEN: Soft and non-tender with normal pitched bowel sounds.  MUSCULOSKELETAL: There are no major deformities or cyanosis. NEUROLOGIC: No focal weakness or paresthesias are detected. SKIN: There are no ulcers or rashes noted. PSYCHIATRIC: The patient has a  normal affect.  STUDIES:   I have reviewed the following: Carotid: Right Carotid: Evidence consistent with a total occlusion of the right  ICA.                Non-hemodynamically significant plaque <50% noted in the  CCA. The                 ECA appears >50% stenosed.   Left Carotid: Evidence consistent with a total occlusion of the left ICA.                Hemodynamically significant plaque >50% visualized in the  CCA. The                ECA appears <50% stenosed. Patent left subclavian subclavian                artery bypass graft.   Vertebrals:  Bilateral vertebral arteries demonstrate antegrade flow.  Subclavians: Normal flow hemodynamics were seen in the right subclavian  artery.    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  I have discussed proceeding with an AUI and femorofemoral to address his claudication.  This will also prevent having to go back in the future to address his aneurysm.  He will likely need an endarterectomy on the left.  I discussed the potential risks and benefits of surgery including wound issues leg swelling and cardiopulmonary complications.  Prior to proceeding with surgery he will need to get clearance from cardiology and pulmonary.  He will need to be off of his Plavix .   Carotid: His left carotid subclavian bypass graft is patent.  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  He is asymptomatic   Malvina Serene CLORE, MD, FACS [facility] [phone] Pager [phone]

## 2024-06-15 ENCOUNTER — Ambulatory Visit: Payer: Self-pay | Admitting: Pulmonary Disease

## 2024-06-15 NOTE — Telephone Encounter (Signed)
   Patient Name: Manuel Richardson  DOB: 1956-07-23 MRN: [id number]  Primary Cardiologist: Newman JINNY Lawrence, MD  Chart reviewed as part of pre-operative protocol coverage. Given past medical history and time since last visit, based on ACC/AHA guidelines, Manuel Richardson is at acceptable risk for the planned procedure without further cardiovascular testing.   Per Dr. Lawrence 06/15/2024: His surgical risk is certainly elevated owing to his underlying COPD and coronary artery disease with known CTO.  That said, I understand that percutaneous repair may not be suitable given his anatomy.  He is as optimized as he can be from cardiac standpoint.  Overall, perioperative cardiac risk elevated, but not prohibitive.  Okay to hold aspirin  up to 5-7 days before surgery and resume soon after surgery.   I will route this recommendation to the requesting party via Epic fax function and remove from pre-op pool.  Please call with questions.  Lum LITTIE Louis, NP 06/15/2024, 3:49 PM

## 2024-06-15 NOTE — Telephone Encounter (Signed)
 His surgical risk is certainly elevated owing to his underlying COPD and coronary artery disease with known CTO.  That said, I understand that percutaneous repair may not be suitable given his anatomy.  He is as optimized as he can be from cardiac standpoint.  Overall, perioperative cardiac risk elevated, but not prohibitive.  Okay to hold aspirin  up to 5-7 days before surgery and resume soon after surgery.  Thanks MJP

## 2024-06-15 NOTE — Telephone Encounter (Signed)
 FYI Only or Action Required?: FYI only for provider.  Patient is followed in Pulmonology for COPD, last seen on 02/16/2024 by Tamea Dedra CROME, MD.  Called Nurse Triage reporting Shortness of Breath and Medical Management of Chronic Issues.  Symptoms began unknown.  Interventions attempted: Home oxygen  use.  Symptoms are: gradually worsening.  Triage Disposition: See PCP Within 2 Weeks  Patient/caregiver understands and will follow disposition?: Yes       Copied from CRM [id number]. Topic: Clinical - Red Word Triage >> Jun 15, 2024 11:29 AM Corean SAUNDERS wrote: Red Word that prompted transfer to Nurse Triage: Worsening trouble breathing Reason for Disposition  Requesting regular office appointment  Answer Assessment - Initial Assessment Questions E2C2 Pulmonary Triage - Initial Assessment Questions Chief Complaint (e.g., cough, sob, wheezing, fever, chills, sweat or additional symptoms) *Go to specific symptom protocol after initial questions. SOB, concerns with O2 concentrator not providing enough O2 as tank Recent visit to vascular surgery yesterday-- endorses major blockages in R leg - sister reports pt wanting to do surgery Endorses doing Pulm Rehab, but pt stopped d/t exertion difficulties  How long have symptoms been present? Sister reports was told yesterday that pt has concerns for O2 concentrator not providing enough O2 as tank-- so unknown how long pt has been experiencing sx  Have you tested for COVID or Flu? Note: If not, ask patient if a home test can be taken. If so, instruct patient to call back for positive results. No  MEDICINES:   Have you used any OTC meds to help with symptoms? No If yes, ask What medications? N/a  Have you used your inhalers/maintenance medication? No If yes, What medications? BREZTRI  AEROSPHERE albuterol  (VENTOLIN  HFA)  If inhaler, ask How many puffs and how often? Note: Review instructions on medication in the  chart. Unsure, sister reports non-compliance  OXYGEN : Do you wear supplemental oxygen ? Yes If yes, How many liters are you supposed to use? 2L  Do you monitor your oxygen  levels? No If yes, What is your reading (oxygen  level) today? Sister endorses non-compliance  What is your usual oxygen  saturation reading?  (Note: Pulmonary O2 sats should be 90% or greater) N/a        1. RESPIRATORY STATUS: Describe your breathing? (e.g., wheezing, shortness of breath, unable to speak, severe coughing)      See above 2. ONSET: When did this breathing problem begin?      unknown 3. PATTERN Does the difficult breathing come and go, or has it been constant since it started?      Constant  4. SEVERITY: How bad is your breathing? (e.g., mild, moderate, severe)      unknown 5. RECURRENT SYMPTOM: Have you had difficulty breathing before? If Yes, ask: When was the last time? and What happened that time?      unknown 6. CARDIAC HISTORY: Do you have any history of heart disease? (e.g., heart attack, angina, bypass surgery, angioplasty)      *No Answer* 7. LUNG HISTORY: Do you have any history of lung disease?  (e.g., pulmonary embolus, asthma, emphysema)     *No Answer* 8. CAUSE: What do you think is causing the breathing problem?      *No Answer* 9. OTHER SYMPTOMS: Do you have any other symptoms? (e.g., chest pain, cough, dizziness, fever, runny nose)     *No Answer* 10. O2 SATURATION MONITOR:  Do you use an oxygen  saturation monitor (pulse oximeter) at home? If Yes, ask: What is your reading (oxygen   level) today? What is your usual oxygen  saturation reading? (e.g., 95%)       N/a 11. PREGNANCY: Is there any chance you are pregnant? When was your last menstrual period?       N/a 12. TRAVEL: Have you traveled out of the country in the last month? (e.g., travel history, exposures)       N/a  Answer Assessment - Initial Assessment Questions 1. REASON  FOR CALL: What is the main reason for your call? or How can I best help you?     Sister calling on pt behalf stating that pt has O2 concerns. Pt told Joen that he feels O2 concentrator is not providing same O2 as tank d/t pt being more SOB above baseline. Joen denies any fever, URI sx at this time and endorses that pt is known to be non-compliant with SpO2 readings and maintenance INH. Joen does not know how long his increased SOB has been going on for. 2. SYMPTOMS : Do you have any symptoms?      Increased SOB 3. OTHER QUESTIONS: Do you have any other questions?     Joen has questions about procedure clearance for vascular surgery. Triager advised that Dr. Tamea may need to have 2 separate visits to address all of pt's needs.  Protocols used: Breathing Difficulty-A-AH, Information Only Call - No Triage-A-AH

## 2024-06-16 ENCOUNTER — Ambulatory Visit: Admitting: Pulmonary Disease

## 2024-06-16 ENCOUNTER — Encounter: Payer: Self-pay | Admitting: Pulmonary Disease

## 2024-06-16 VITALS — BP 126/78 | HR 81 | Temp 98.6°F | Ht 68.0 in | Wt 169.4 lb

## 2024-06-16 DIAGNOSIS — R0609 Other forms of dyspnea: Secondary | ICD-10-CM | POA: Diagnosis not present

## 2024-06-16 DIAGNOSIS — J449 Chronic obstructive pulmonary disease, unspecified: Secondary | ICD-10-CM | POA: Diagnosis not present

## 2024-06-16 DIAGNOSIS — Z01811 Encounter for preprocedural respiratory examination: Secondary | ICD-10-CM | POA: Diagnosis not present

## 2024-06-16 DIAGNOSIS — I240 Acute coronary thrombosis not resulting in myocardial infarction: Secondary | ICD-10-CM | POA: Diagnosis not present

## 2024-06-16 DIAGNOSIS — J439 Emphysema, unspecified: Secondary | ICD-10-CM | POA: Diagnosis not present

## 2024-06-16 MED ORDER — ALBUTEROL SULFATE HFA 108 (90 BASE) MCG/ACT IN AERS: 2.0000 | INHALATION_SPRAY | Freq: Four times a day (QID) | RESPIRATORY_TRACT | 2 refills | Status: AC | PRN

## 2024-06-16 MED ORDER — BREZTRI AEROSPHERE 160-9-4.8 MCG/ACT IN AERO: 2.0000 | INHALATION_SPRAY | Freq: Two times a day (BID) | RESPIRATORY_TRACT | 6 refills | Status: DC

## 2024-06-16 NOTE — Patient Instructions (Signed)
 VISIT SUMMARY:  During your visit, we discussed your worsening shortness of breath and fatigue, which occur even with minimal exertion. We reviewed your current treatments and the challenges you face with your chronic conditions, including COPD, PAD, and CAD. We also talked about your upcoming vascular surgery and the need for continued coordination of care among your healthcare providers.  YOUR PLAN:  -CHRONIC OBSTRUCTIVE PULMONARY DISEASE (COPD): COPD is a chronic lung disease that makes it hard to breathe. You will continue using nocturnal oxygen  therapy and are encouraged to complete pulmonary rehabilitation once your leg pain is managed.  -PERIPHERAL ARTERY DISEASE (PAD): PAD is a condition where the arteries in your legs are narrowed or blocked, causing pain and limiting your ability to participate in activities like pulmonary rehabilitation. We will send a clearance note to your vascular surgeon for your leg surgery, which may help alleviate your leg pain and improve your ability to participate in rehabilitation.  -CORONARY ARTERY DISEASE (CAD) WITH 100% RIGHT CORONARY ARTERY BLOCKAGE: CAD is a condition where the arteries supplying blood to your heart are narrowed or blocked. You have a complete blockage in your right coronary artery that cannot be surgically treated due to high risks. You will continue your current cardiac medications to help improve blood flow through other vessels.  INSTRUCTIONS:  Please schedule a follow-up appointment in four months. We will also send a note to Dr. Serene regarding your condition and clearance for leg surgery.

## 2024-06-16 NOTE — Progress Notes (Signed)
 Subjective:    Patient ID: Manuel Richardson, male    DOB: 1955-12-20, 68 y.o.   MRN: [id number]  Patient Care Team: Valentin Skates, DO as PCP - General (Internal Medicine) Elmira Newman PARAS, MD as PCP - Cardiology (Cardiology)  Chief Complaint  Patient presents with   COPD    Shortness of breath on exertion and occasional at rest. Cough and wheezing. Patient using 2L oxygen  day and night.     BACKGROUND/INTERVAL:Patient is a 68 year old former smoker with 100-pack-year history of smoking and a history as noted below who presents for follow-up of dyspnea on exertion and fatigue.  Initially noted in May 2022 after COVID-19 infection.  Previously evaluated by Dr. Vicenta Lennert in August 2023 and no follow-up after that.  Patient has been on Breztri  and albuterol  for his dyspnea with only partial relief of symptoms.  Recently had left heart cath due to high risk cardiac PET/CT.  He has complete chronic occlusion of the RCA but it could not be intervened on due to noted to cross with a wire.  He is on medical therapy.  He continues to complain of severe dyspnea.  He was only able to complete 10 sessions of pulmonary rehab due to severe claudication.  He presents for follow-up and for preoperative pulmonary assessment prior to proposed aorto uni iliac stent grafting and femoro-femoral bypass.  HPI Discussed the use of AI scribe software for clinical note transcription with the patient, who gave verbal consent to proceed.  History of Present Illness   Manuel Richardson is a 68 year old male with coronary artery disease and peripheral vascular disease who presents with worsening shortness of breath.  He experiences worsening shortness of breath and feels exhausted with minimal exertion, such as walking to the mailbox. He uses supplemental oxygen  at night and often during the day while sitting. He uses a nebulizer twice a day but notes no significant improvement in his symptoms.  He is on Breztri  2  puffs twice a day and as needed albuterol .  He has a known chronic occlusion of the RCA that is being treated medically due to inability to intervene so guidewire could not be passed. He has not discussed surgical options for this blockage with his cardiologist. He is on medications to manage his condition, but the specific medications are not detailed.  He has regular follow-up with cardiology.  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 He attempted pulmonary rehabilitation but had to stop after ten sessions due to leg pain (claudication). He notes significant improvement in his breathing when using a high-flow oxygen  tank, which he describes as making him feel like he could 'run a mile'.  He saw a vascular surgeon on August 7th, 2025. He is awaiting clearance for surgery, which he believes will help alleviate his leg pain and improve his ability to participate in pulmonary rehabilitation.   He has not had any recent exacerbations nor hospitalizations for COPD.  DATA 09/21/2018 spirometry: FEV1 1.70 L or 53% predicted, FVC 2.86 L or 66% predicted, FEV1/FVC 59%.  Consistent with moderate obstruction. 05/30/2022 spirometry: FEV1 1.49 L or 46% predicted, FVC 3.07 L or 70% predicted, FEV1/FVC 48%.  Consistent with moderate to severe obstruction. 10/09/2023 echocardiogram: LVEF 55 to 60%, normal LV function, mild left ventricular hypertrophy, grade 1 DD.  RV systolic function normal.  Revealed mitral valve  regurgitation.  No mitral stenosis.  No aortic valve abnormalities. 11/05/2023 PFTs: FEV1 1.57 L or 48% predicted, FVC 3.23 L or 73% of predicted, FEV1/FVC 48%, no significant bronchodilator response, volumes show air trapping and mild hyperinflation, diffusion capacity is severely decreased.  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 01/01/2024  cardiac PET/CT: EF noted to be 33 to 36%.  Study was high risk for ischemia. 01/16/2024 left heart cath: RCA with proximal 99% stenosis followed by chronic total occlusion.  Left-to-right collaterals from LAD septal branches feeding distal RCA and beyond.  No other significant stenosis noted.  PCI could not be done at that time. 01/29/2024 coronary CTO intervention: RCA to mid RCA lesion 100% stenosed.  Unsuccessful CTO PCI of the RCA.  Unable to cross with a wire. 02/18/2024 alpha-1 antitrypsin: Phenotype MS, level 145 mg/dL.  Review of Systems A 10 point review of systems was performed and it is as noted above otherwise negative.   Patient Active Problem List   Diagnosis Date Noted   Angina pectoris (HCC) 01/29/2024   Pre-op evaluation 01/05/2024   Coronary artery calcification 11/10/2023   Exertional dyspnea 11/04/2023   Atrial fibrillation with RVR (HCC) 03/28/2021   COVID-19 virus infection 03/28/2021   PAD (peripheral artery disease) (HCC) 09/21/2018   Bilateral carotid bruits 09/21/2018   Tobacco use disorder 07/18/2014   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/26/2012   Coronary artery disease 05/26/2012   ED (erectile dysfunction) 03/30/2012   Primary hypertension 03/30/2012   Stage 3 severe COPD by GOLD classification (HCC) 03/30/2012   Annual physical exam 03/05/2011   Hyperlipidemia 03/05/2011   Carotid artery disease (HCC) 03/05/2011    Social History   Tobacco Use   Smoking status: Former    Current packs/day: 0.00    Average packs/day: 3.0 packs/day for 54.0 years (162.0 ttl pk-yrs)    Types: Cigarettes    Start date: 13    Quit date: 2024    Years since quitting: 1.6   Smokeless tobacco: Never   Tobacco comments:    Quit 12/28/2022 khj 09/17/2023    Smoked for 53 years.     Smoked 3 PPD at his heaviest  Substance Use Topics   Alcohol use: Yes    Alcohol/week: 12.0 standard drinks of alcohol    Types: 12 Cans of beer per week    Comment: occ    No Known  Allergies  Current Meds  Medication Sig   amLODipine  (NORVASC ) 5 MG tablet Take 1 tablet (5 mg total) by mouth daily.   clopidogrel  (PLAVIX ) 75 MG tablet Take 1 tablet (75 mg total) by mouth daily.   ezetimibe  (ZETIA ) 10 MG tablet Take 1 tablet (10 mg total) by mouth daily.   losartan  (COZAAR ) 50 MG tablet Take 1 tablet (50 mg total) by mouth daily.   metoprolol  succinate (TOPROL  XL) 50 MG 24 hr tablet Take 1 tablet (50 mg total) by mouth daily. Take with or immediately following a meal.   nitroGLYCERIN  (NITROSTAT ) 0.4 MG SL tablet Place 1 tablet (0.4 mg total) under the tongue every 5 (five) minutes as needed for chest pain.   ranolazine  (RANEXA ) 500 MG 12 hr tablet Take 1 tablet (500 mg total) by mouth 2 (two) times daily.   rosuvastatin  (CRESTOR ) 40 MG tablet Take 1 tablet (40 mg total) by mouth daily.   [DISCONTINUED] albuterol  (VENTOLIN  HFA) 108 (90 Base) MCG/ACT inhaler Inhale 2 puffs into the lungs every 6 (six) hours as needed for wheezing or  shortness of breath.   [DISCONTINUED] BREZTRI  AEROSPHERE 160-9-4.8 MCG/ACT AERO Inhale 2 puffs into the lungs 2 (two) times daily.    Immunization History  Administered Date(s) Administered   Influenza,inj,Quad PF,6+ Mos 07/18/2014   Pneumococcal Polysaccharide-23 03/05/2011   Tdap 03/05/2011        Objective:     BP 126/78   Pulse 81   Temp 98.6 F (37 C) (Oral)   Ht 5' 8 (1.727 m)   Wt 169 lb 6.4 oz (76.8 kg)   SpO2 94%   BMI 25.76 kg/m   SpO2: 94 %  GENERAL: Awake, alert, fully ambulatory.  No use of accessory use noted.  No conversational dyspnea. HEAD: Normocephalic, atraumatic.  EYES: Pupils equal, round, reactive to light.  No scleral icterus.  MOUTH: Few chipped teeth, oral mucosa moist.  Pharynx clear. NECK: Supple. No thyromegaly. Trachea midline. No JVD.  No adenopathy. PULMONARY: Distant breath sounds bilaterally.  Coarse, no adventitious sounds otherwise. CARDIOVASCULAR: S1 and S2. Regular rate and rhythm.  No  rubs, murmurs or gallops heard. ABDOMEN: Mild truncal obesity. MUSCULOSKELETAL: No joint deformity, no clubbing, no edema.  NEUROLOGIC: No overt focal deficit, no gait disturbance, speech is fluent. SKIN: Intact,warm,dry. PSYCH: Mood and behavior normal  Ambulatory oxymetry was performed today:  At rest on room air oxygen  saturation was 100%, the patient ambulated at a normal pace, completed 3 laps, O2 nadir 94%, moderate shortness of breath.  Resting heart rate was 68 bpm at maximum for this exercise 94 bpm.  No clinically significant oxygen  desaturation noted.  ARISCAT score 26: Intermediate risk (13.3%) of in-hospital postop pulmonary complications including respiratory failure, respiratory infection, pleural effusion, atelectasis, pneumothorax, bronchospasm, aspiration pneumonitis and need for mechanical ventilation postop.  Risks discussed with the patient.   Assessment & Plan:     ICD-10-CM   1. Stage 3 severe COPD by GOLD classification (HCC)  J44.9     2. Dyspnea on exertion  R06.09     3. RCA occlusion (HCC)  I24.0     4. Preoperative respiratory examination  Z01.811       Meds ordered this encounter  Medications   BREZTRI  AEROSPHERE 160-9-4.8 MCG/ACT AERO inhaler    Sig: Inhale 2 puffs into the lungs 2 (two) times daily.    Dispense:  10.7 g    Refill:  6   albuterol  (VENTOLIN  HFA) 108 (90 Base) MCG/ACT inhaler    Sig: Inhale 2 puffs into the lungs every 6 (six) hours as needed for wheezing or shortness of breath.    Dispense:  8 g    Refill:  2   Discussion:    Chronic Obstructive Pulmonary Disease (COPD) Worsening dyspnea and fatigue with minimal exertion, such as walking to the mailbox. Utilizes nocturnal oxygen  and occasionally during the day. On nebulizer treatment twice daily without significant improvement. Oxygen  saturation improved from 91% to 94% during a walk test, indicating compensatory lung function. Incomplete pulmonary rehabilitation due to leg pain.   - Continue nocturnal oxygen  therapy - Encourage completion of pulmonary rehabilitation once claudication pain is managed - Continue Breztri , as needed albuterol  and Ohtuvayre  - Refilled Breztri  and albuterol   Peripheral Artery Disease (PAD) Arterial blockage in the legs causing significant pain and limiting participation in pulmonary rehabilitation. Vascular surgery is considering intervention pending pulmonary clearance. Surgery may alleviate leg pain and improve rehabilitation participation. - Send clearance note to vascular surgeon for leg surgery - Coordinate with vascular surgery for potential intervention - Patient has moderate risk  for proposed procedure see ARISCAT score above - Discussed potential pulmonary risks with patient and risk assessment of moderate risk and implications of same  Coronary Artery Disease (CAD) with 100% Right Coronary Artery occlusion, chronic 100% blockage in the right coronary artery, suspect this adds to his sensation of dyspnea which is mostly manifested as FATIGUE.  On medical management.  - Continue current cardiac medications - Continue follow-up with cardiology  Follow-up Requires ongoing monitoring and coordination of care between pulmonary, cardiology, and vascular surgery specialties. - Schedule follow-up appointment in four months - Send a note to Dr.Brabham regarding condition and clearance for vascular surgery      Advised if symptoms do not improve or worsen, to please contact office for sooner follow up or seek emergency care.    I spent 42 minutes of dedicated to the care of this patient on the date of this encounter to include pre-visit review of records, face-to-face time with the patient discussing conditions above, post visit ordering of testing, clinical documentation with the electronic health record, making appropriate referrals as documented, and communicating necessary findings to members of the patients care team.     C.  Leita Sanders, MD Advanced Bronchoscopy PCCM [facility]    *This note was generated using voice recognition software/Dragon and/or AI transcription program.  Despite best efforts to proofread, errors can occur which can change the meaning. Any transcriptional errors that result from this process are unintentional and may not be fully corrected at the time of dictation.

## 2024-06-18 NOTE — Telephone Encounter (Signed)
 I completed my note on 20 August on the same day he presented.  The note contains the needed surgical clearance.  They just need to read the progress note.  A copy of the note was sent to Dr. Serene, the surgeon.

## 2024-06-18 NOTE — Telephone Encounter (Signed)
 Pt seen by Dr. Tamea 06/16/24- note mentions sending letter for surgery clearance. Dr Tamea, can you add a risk assessment to the note and then I will fax it over to vascular? Thank you!

## 2024-06-21 NOTE — Telephone Encounter (Signed)
 Copy of the 06/16/24 ov note with Dr Tamea was faxed to Vascular.

## 2024-06-24 ENCOUNTER — Other Ambulatory Visit: Payer: Self-pay

## 2024-06-24 DIAGNOSIS — I70213 Atherosclerosis of native arteries of extremities with intermittent claudication, bilateral legs: Secondary | ICD-10-CM

## 2024-07-05 NOTE — Pre-Procedure Instructions (Signed)
 Surgical Instructions   Your procedure is scheduled on July 07, 2024. Report to [facility] Main Entrance A at 6:30 A.M., then check in with the Admitting office. Any questions or running late day of surgery: call [phone]  Questions prior to your surgery date: call [phone], Monday-Friday, 8am-4pm. If you experience any cold or flu symptoms such as cough, fever, chills, shortness of breath, etc. between now and your scheduled surgery, please notify us  at the above number.     Remember:  Do not eat or drink after midnight the night before your surgery    Take these medicines the morning of surgery with A SIP OF WATER: amLODipine  (NORVASC )  aspirin  BREZTRI  AEROSPHERE Inhaler ezetimibe  (ZETIA )  metoprolol  succinate (TOPROL  XL)  ranolazine  (RANEXA )  rosuvastatin  (CRESTOR )    May take these medicines IF NEEDED: albuterol  (VENTOLIN  HFA) inhaler - please bring inhaler with you morning of surgery nitroGLYCERIN  (NITROSTAT ) - if dose taken prior to surgery, please call [phone]   STOP taking your clopidogrel  (PLAVIX ) five days prior to surgery. Your last dose will be September 4th.   One week prior to surgery, STOP taking any Aspirin  (unless otherwise instructed by your surgeon) Aleve, Naproxen, Ibuprofen, Motrin, Advil, Goody's, BC's, all herbal medications, fish oil, and non-prescription vitamins.                     Do NOT Smoke (Tobacco/Vaping) for 24 hours prior to your procedure.  If you use a CPAP at night, you may bring your mask/headgear for your overnight stay.   You will be asked to remove any contacts, glasses, piercing's, hearing aid's, dentures/partials prior to surgery. Please bring cases for these items if needed.    Patients discharged the day of surgery will not be allowed to drive home, and someone needs to stay with them for 24 hours.  SURGICAL WAITING ROOM VISITATION Patients may have no more than 2 support people in the waiting area - these  visitors may rotate.   Pre-op nurse will coordinate an appropriate time for 1 ADULT support person, who may not rotate, to accompany patient in pre-op.  Children under the age of 79 must have an adult with them who is not the patient and must remain in the main waiting area with an adult.  If the patient needs to stay at the hospital during part of their recovery, the visitor guidelines for inpatient rooms apply.  Please refer to the [facility] website for the visitor guidelines for any additional information.   If you received a COVID test during your pre-op visit  it is requested that you wear a mask when out in public, stay away from anyone that may not be feeling well and notify your surgeon if you develop symptoms. If you have been in contact with anyone that has tested positive in the last 10 days please notify you surgeon.      Pre-operative CHG Bathing Instructions   You can play a key role in reducing the risk of infection after surgery. Your skin needs to be as free of germs as possible. You can reduce the number of germs on your skin by washing with CHG (chlorhexidine  gluconate) soap before surgery. CHG is an antiseptic soap that kills germs and continues to kill germs even after washing.   DO NOT use if you have an allergy to chlorhexidine /CHG or antibacterial soaps. If your skin becomes reddened or irritated, stop using the CHG and notify one of our RNs at  [phone].              TAKE A SHOWER THE NIGHT BEFORE SURGERY AND THE DAY OF SURGERY    Please keep in mind the following:  DO NOT shave, including legs and underarms, 48 hours prior to surgery.   You may shave your face before/day of surgery.  Place clean sheets on your bed the night before surgery Use a clean washcloth (not used since being washed) for each shower. DO NOT sleep with pet's night before surgery.  CHG Shower Instructions:  Wash your face and private area with normal soap. If you choose to wash your  hair, wash first with your normal shampoo.  After you use shampoo/soap, rinse your hair and body thoroughly to remove shampoo/soap residue.  Turn the water OFF and apply half the bottle of CHG soap to a CLEAN washcloth.  Apply CHG soap ONLY FROM YOUR NECK DOWN TO YOUR TOES (washing for 3-5 minutes)  DO NOT use CHG soap on face, private areas, open wounds, or sores.  Pay special attention to the area where your surgery is being performed.  If you are having back surgery, having someone wash your back for you may be helpful. Wait 2 minutes after CHG soap is applied, then you may rinse off the CHG soap.  Pat dry with a clean towel  Put on clean pajamas    Additional instructions for the day of surgery: DO NOT APPLY any lotions, deodorants, cologne, or perfumes.   Do not wear jewelry or makeup Do not wear nail polish, gel polish, artificial nails, or any other type of covering on natural nails (fingers and toes) Do not bring valuables to the hospital. [facility] is not responsible for valuables/personal belongings. Put on clean/comfortable clothes.  Please brush your teeth.  Ask your nurse before applying any prescription medications to the skin.

## 2024-07-06 ENCOUNTER — Encounter (HOSPITAL_COMMUNITY): Payer: Self-pay

## 2024-07-06 ENCOUNTER — Other Ambulatory Visit: Payer: Self-pay

## 2024-07-06 ENCOUNTER — Inpatient Hospital Stay (HOSPITAL_COMMUNITY)
Admission: RE | Admit: 2024-07-06 | Discharge: 2024-07-06 | Disposition: A | Discharge status: Home / Self Care | Source: Ambulatory Visit | Attending: Surgery

## 2024-07-06 VITALS — BP 127/74 | HR 76 | Temp 97.8°F | Resp 19 | Ht 68.0 in | Wt 168.4 lb

## 2024-07-06 DIAGNOSIS — I2510 Atherosclerotic heart disease of native coronary artery without angina pectoris: Secondary | ICD-10-CM | POA: Insufficient documentation

## 2024-07-06 DIAGNOSIS — Z87891 Personal history of nicotine dependence: Secondary | ICD-10-CM | POA: Insufficient documentation

## 2024-07-06 DIAGNOSIS — Z01818 Encounter for other preprocedural examination: Secondary | ICD-10-CM

## 2024-07-06 DIAGNOSIS — I10 Essential (primary) hypertension: Secondary | ICD-10-CM | POA: Insufficient documentation

## 2024-07-06 DIAGNOSIS — Z01812 Encounter for preprocedural laboratory examination: Secondary | ICD-10-CM | POA: Insufficient documentation

## 2024-07-06 DIAGNOSIS — E785 Hyperlipidemia, unspecified: Secondary | ICD-10-CM | POA: Insufficient documentation

## 2024-07-06 DIAGNOSIS — I480 Paroxysmal atrial fibrillation: Secondary | ICD-10-CM | POA: Insufficient documentation

## 2024-07-06 DIAGNOSIS — I70213 Atherosclerosis of native arteries of extremities with intermittent claudication, bilateral legs: Secondary | ICD-10-CM | POA: Insufficient documentation

## 2024-07-06 DIAGNOSIS — R0602 Shortness of breath: Secondary | ICD-10-CM | POA: Insufficient documentation

## 2024-07-06 DIAGNOSIS — J449 Chronic obstructive pulmonary disease, unspecified: Secondary | ICD-10-CM | POA: Insufficient documentation

## 2024-07-06 DIAGNOSIS — R0609 Other forms of dyspnea: Secondary | ICD-10-CM | POA: Insufficient documentation

## 2024-07-06 HISTORY — DX: Dyspnea, unspecified: R06.00

## 2024-07-06 HISTORY — DX: Peripheral vascular disease, unspecified: I73.9

## 2024-07-06 HISTORY — DX: Angina pectoris, unspecified: I20.9

## 2024-07-06 HISTORY — DX: Atherosclerotic heart disease of native coronary artery without angina pectoris: I25.10

## 2024-07-06 LAB — URINALYSIS, ROUTINE W REFLEX MICROSCOPIC
Bilirubin Urine: NEGATIVE
Glucose, UA: NEGATIVE mg/dL
Hgb urine dipstick: NEGATIVE
Ketones, ur: NEGATIVE mg/dL
Leukocytes,Ua: NEGATIVE
Nitrite: NEGATIVE
Protein, ur: NEGATIVE mg/dL
Specific Gravity, Urine: 1.013 (ref 1.005–1.030)
pH: 5 (ref 5.0–8.0)

## 2024-07-06 LAB — COMPREHENSIVE METABOLIC PANEL WITH GFR
ALT: 39 U/L (ref 0–44)
AST: 29 U/L (ref 15–41)
Albumin: 4.5 g/dL (ref 3.5–5.0)
Alkaline Phosphatase: 67 U/L (ref 38–126)
Anion gap: 12 (ref 5–15)
BUN: 16 mg/dL (ref 8–23)
CO2: 21 mmol/L — ABNORMAL LOW (ref 22–32)
Calcium: 10.1 mg/dL (ref 8.9–10.3)
Chloride: 101 mmol/L (ref 98–111)
Creatinine, Ser: 1.25 mg/dL — ABNORMAL HIGH (ref 0.61–1.24)
GFR, Estimated: >60 mL/min (ref >60)
Glucose, Bld: 115 mg/dL — ABNORMAL HIGH (ref 70–99)
Potassium: 4.6 mmol/L (ref 3.5–5.1)
Sodium: 134 mmol/L — ABNORMAL LOW (ref 135–145)
Total Bilirubin: 0.6 mg/dL (ref 0.0–1.2)
Total Protein: 7.7 g/dL (ref 6.5–8.1)

## 2024-07-06 LAB — TYPE AND SCREEN
ABO/RH(D): A POS
Antibody Screen: NEGATIVE

## 2024-07-06 LAB — CBC
HCT: 47.3 % (ref 39.0–52.0)
Hemoglobin: 15.4 g/dL (ref 13.0–17.0)
MCH: 30.4 pg (ref 26.0–34.0)
MCHC: 32.6 g/dL (ref 30.0–36.0)
MCV: 93.3 fL (ref 80.0–100.0)
Platelets: 212 K/uL (ref 150–400)
RBC: 5.07 MIL/uL (ref 4.22–5.81)
RDW: 13.4 % (ref 11.5–15.5)
WBC: 7.1 K/uL (ref 4.0–10.5)
nRBC: 0 % (ref 0.0–0.2)

## 2024-07-06 LAB — SURGICAL PCR SCREEN
MRSA, PCR: NEGATIVE
Staphylococcus aureus: NEGATIVE

## 2024-07-06 LAB — APTT: aPTT: 28 s (ref 24–36)

## 2024-07-06 LAB — PROTIME-INR
INR: 1 (ref 0.8–1.2)
Prothrombin Time: 13.5 s (ref 11.4–15.2)

## 2024-07-06 NOTE — Anesthesia Preprocedure Evaluation (Signed)
 Anesthesia Evaluation  Patient identified by MRN, date of birth, ID band Patient awake    Reviewed: Allergy & Precautions, NPO status , Patient's Chart, lab work & pertinent test results  History of Anesthesia Complications Negative for: history of anesthetic complications  Airway Mallampati: I  TM Distance: >3 FB Neck ROM: Full    Dental  (+) Edentulous Upper, Edentulous Lower, Dental Advisory Given   Pulmonary shortness of breath, neg sleep apnea, COPD, neg recent URI, former smoker   breath sounds clear to auscultation       Cardiovascular hypertension, Pt. on medications and Pt. on home beta blockers + CAD and + Peripheral Vascular Disease  + dysrhythmias Atrial Fibrillation  Rhythm:Regular  1. Left ventricular ejection fraction, by estimation, is 55 to 60%. The  left ventricle has normal function. The left ventricle has no regional  wall motion abnormalities. There is mild left ventricular hypertrophy.  Left ventricular diastolic parameters  are consistent with Grade I diastolic dysfunction (impaired relaxation).   2. Right ventricular systolic function is normal. The right ventricular  size is normal. Tricuspid regurgitation signal is inadequate for assessing  PA pressure.   3. The mitral valve is normal in structure. Trivial mitral valve  regurgitation. No evidence of mitral stenosis.   4. The aortic valve is tricuspid. Aortic valve regurgitation is not  visualized. No aortic stenosis is present.     Neuro/Psych negative neurological ROS  negative psych ROS   GI/Hepatic Neg liver ROS,GERD  Controlled,,  Endo/Other  negative endocrine ROS    Renal/GU Renal diseaseLab Results      Component                Value               Date                      NA                       134 (L)             07/06/2024                K                        4.6                 07/06/2024                CO2                       21 (L)              07/06/2024                GLUCOSE                  115 (H)             07/06/2024                BUN                      16                  07/06/2024                CREATININE  1.25 (H)            07/06/2024                CALCIUM                   10.1                07/06/2024                EGFR                     82                  01/19/2024                GFRNONAA                 >60                 07/06/2024                Musculoskeletal negative musculoskeletal ROS (+)    Abdominal   Peds  Hematology  (+) Blood dyscrasia Lab Results      Component                Value               Date                      WBC                      7.1                 07/06/2024                HGB                      15.4                07/06/2024                HCT                      47.3                07/06/2024                MCV                      93.3                07/06/2024                PLT                      212                 07/06/2024             Plavix     Anesthesia Other Findings   Reproductive/Obstetrics                              Anesthesia Physical Anesthesia Plan  ASA: 3  Anesthesia Plan: General   Post-op Pain Management: Ofirmev  IV (intra-op)*   Induction: Intravenous  PONV Risk Score and Plan: 2  and Ondansetron  and Dexamethasone   Airway Management Planned: Oral ETT  Additional Equipment: Arterial line  Intra-op Plan:   Post-operative Plan:   Informed Consent: I have reviewed the patients History and Physical, chart, labs and discussed the procedure including the risks, benefits and alternatives for the proposed anesthesia with the patient or authorized representative who has indicated his/her understanding and acceptance.     Dental advisory given  Plan Discussed with: CRNA  Anesthesia Plan Comments: (PAT note written 07/06/2024 by Deston Bilyeu, PA-C.  Preoperative  cardiology input per Dr. Elmira on 06/15/2024, His surgical risk is certainly elevated owing to his underlying COPD and coronary artery disease with known CTO.  That said, I understand that percutaneous repair may not be suitable given his anatomy.  He is as optimized as he can be from cardiac standpoint.  Overall, perioperative cardiac risk elevated, but not prohibitive.  Okay to hold aspirin  up to 5-7 days before surgery and resume soon after surgery.   He had preoperative pulmonology evaluation by Dr. Tamea on 06/16/2024. ARISCAT score 26: Intermediate risk (13.3%) of in-hospital postop pulmonary complications including respiratory failure, respiratory infection, pleural effusion, atelectasis, pneumothorax, bronchospasm, aspiration pneumonitis and need for mechanical ventilation postop.  Risks discussed with the patient. )         Anesthesia Quick Evaluation

## 2024-07-06 NOTE — Progress Notes (Signed)
 PCP - Dr. Massie Sewer Cardiologist - Dr. Newman Lawrence - last office visit 06/03/2024 Pulmonologist - Dr. Dedra Sanders  PPM/ICD - Denies Device Orders - n/a Rep Notified - n/a  Chest x-ray - n/a EKG - 01/30/2024 Stress Test - 01/01/2024 ECHO - 10/09/2023 Cardiac Cath - 01/29/2024  Sleep Study - Denies CPAP - n/a  No DM  Last dose of GLP1 agonist- n/a GLP1 instructions: n/a  Blood Thinner Instructions: Pt instructed to stop Plavix  5 days prior to surgery. Last dose was 9/4 Aspirin  Instructions: Pt instructed to start taking ASA on 9/5 (which he confirmed) and will continue to take through morning of surgery  NPO after midnight  COVID TEST- n/a   Anesthesia review: Yes. Cardiac and pulmonary clearance  Patient denies shortness of breath, fever, cough and chest pain at PAT appointment. Pt denies any respiratory illness/infection in the last two months.    All instructions explained to the patient, with a verbal understanding of the material. Patient agrees to go over the instructions while at home for a better understanding. Patient also instructed to self quarantine after being tested for COVID-19. The opportunity to ask questions was provided.

## 2024-07-06 NOTE — Progress Notes (Addendum)
 Anesthesia Chart Review:   Case: [id number] Date/Time: 07/07/24 0815   Procedures:      INSERTION, ENDOVASCULAR STENT GRAFT, AORTA, ABDOMINAL     CREATION, BYPASS, ARTERIAL, FEMORAL TO FEMORAL, USING GRAFT (Bilateral)   Anesthesia type: General   Diagnosis: Atherosclerosis of native artery of both lower extremities with intermittent claudication (HCC) [I70.213]   Pre-op diagnosis: atherosclerosis BLE   [facility]: [facility] ROOM 16 / [facility]   Surgeons: Serene Gaile ORN, MD       DISCUSSION: Patient is a 68 year old male scheduled for the above procedure.   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  Last office visit with cardiology was on 06/03/2024. High risk stress test in March 2025 showing EF 33-36%, inferior ischemia. (LVEF 55-60%, no RWMA by 09/2023 TTE.). Cardiac cath on 01/16/2024 showed moderate calcification but no significant stenosis in the LM and LAD. No significant stenosis in the LCX. 99% proximal RCA stenosis followed by CTO. He had an unsuccessful attempt at RCT CTP intervention on 01/29/2024 and has been treated medically. Chronic DOE, likely combination of CAD and COPD. Unable to complete pulmonary rehab due to limiting claudication from severe PAD which was followed by Dr. Serene. Ranexa  added and ASA changed to Plavix  monotherapy. Continue amlodipine , metoprolol  succinate, losartan , Crestor , Zetia . Has also been on Leqvio. Not on Eliquis  given no recurrent afib since COVID in 03/2021. Six month follow-up planned.   Preoperative cardiology input per Dr. Elmira on 06/15/2024, His surgical risk is certainly elevated owing to his underlying COPD and coronary artery  disease with known CTO.  That said, I understand that percutaneous repair may not be suitable given his anatomy.  He is as optimized as he can be from cardiac standpoint.  Overall, perioperative cardiac risk elevated, but not prohibitive.  Okay to hold aspirin  up to 5-7 days before surgery and resume soon after surgery.    He had preoperative pulmonology evaluation by Dr. Tamea on 06/16/2024. He has Gold Stage 3 severe COPD on home O2 (nocturnal and as needed during the day). He stopped smoking in 2024. He was only able to participate in 10 sessions of pulmonary rehab due to severe claudication. Is hoping vascular surgery will help. He denied any recent exacerbations.He in on Breztri , as needed albuterol , and Ohtuvayre  (ensifentrine ). He noted significant improvement in breathing when using a high-flow oxygen  tank. No desaturations with ambulatory oximetry. She wrote, ARISCAT score 26: Intermediate risk (13.3%) of in-hospital postop pulmonary complications including respiratory failure, respiratory infection, pleural effusion, atelectasis, pneumothorax, bronchospasm, aspiration pneumonitis and need for mechanical ventilation postop.  Risks discussed with the patient.  Anesthesia team to evaluate on the day of surgery.    VS: BP 127/74   Pulse 76   Temp 36.6 C   Resp 19   Ht 5' 8 (1.727 m)   Wt 76.4 kg   SpO2 93%   BMI 25.61 kg/m   PROVIDERS: Valentin Skates, DO is PCP  Elmira Penman, MD is cardiologist Tamea Saunas, MD is pulmonologist  Serene Gaile ORN, MD is vascular surgeon   LABS: Labs reviewed: Acceptable for surgery. (all labs ordered are listed, but only abnormal results are displayed)  Labs Reviewed  COMPREHENSIVE  METABOLIC PANEL WITH GFR - Abnormal; Notable for the following components:      Result Value   Sodium 134 (*)    CO2 21 (*)    Glucose, Bld 115 (*)    Creatinine, Ser 1.25 (*)    All other components within normal limits  SURGICAL PCR SCREEN  CBC   PROTIME-INR  APTT  URINALYSIS, ROUTINE W REFLEX MICROSCOPIC  TYPE AND SCREEN    OTHER: Ambulatory oximetry 06/16/2024: At rest on room air oxygen  saturation was 100%, the patient ambulated at a normal pace, completed 3 laps, O2 nadir 94%, moderate shortness of breath.  Resting heart rate was 68 bpm at maximum for this exercise 94 bpm.  No clinically significant oxygen  desaturation noted.  Overnight Pulse Oximetry Study 02/25/2024: Qualified - Group 1: SpO2 fell to 88% or less.  Alpha-1 antitrypsin 02/18/2024: Phenotype MS, level 145 mg/dL  PFTs 05/29/7973: FEV1 8.42 L or 48% predicted, FVC 3.23 L or 73% of predicted, FEV1/FVC 48%, no significant bronchodilator response, volumes show air trapping and mild hyperinflation, diffusion capacity is severely decreased.  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   IMAGES: CTA Ao+BiFem 01/23/2024: IMPRESSION: 1. 3.1 cm infrarenal abdominal aortic aneurysm. Recommend follow-up ultrasound every 3 years. 2. 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 3. 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 4. 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 5. Small hiatal hernia. 6. Colonic diverticulosis.   CT Chest LCS 10/10/2023: IMPRESSION: 1. Lung-RADS 3S, probably benign findings. Short-term follow-up in 6 months is recommended with repeat low-dose chest CT without contrast (please use the following order, CT CHEST LCS NODULE FOLLOW-UP W/O CM). 2. The S modifier above refers to potentially clinically significant non lung cancer related findings. Specifically, there is aortic atherosclerosis, in addition to left main and three-vessel coronary artery disease. Please note that although the presence of coronary artery calcium  documents the presence of coronary artery disease, the severity of this disease and any potential stenosis cannot be assessed on this non-gated CT  examination. Assessment for potential risk factor modification, dietary therapy or pharmacologic therapy may be warranted, if clinically indicated. 3. Mild diffuse bronchial wall thickening with severe centrilobular and paraseptal emphysema; imaging findings suggestive of underlying COPD. - Aortic Atherosclerosis (ICD10-I70.0) and Emphysema (ICD10-J43.9).  CTA Head 06/11/2023: IMPRESSION: 1. Occlusion of the cervical, petrous, and cavernous segments of the bilateral internal carotid arteries with reconstitution of the level of the supraclinoid ICAs via robust PCOM bilaterally. Although no prior imaging is available for comparison, this finding was present on prior report available in care everywhere dated 05/07/2021. 2. No acute intracranial abnormality.    EKG: 01/30/2024: NSR   CV: US  Carotid 05/14/2024: Summary:  - Right Carotid: Evidence consistent with a total occlusion of the right ICA. Non-hemodynamically significant plaque <50% noted in the CCA. The ECA appears >50% stenosed.  - Left Carotid: Evidence consistent with a total occlusion of the left ICA. Hemodynamically significant plaque >50% visualized in the CCA. The ECA appears <50% stenosed. Patent left subclavian subclavian artery bypass graft.  - Vertebrals:  Bilateral vertebral arteries demonstrate antegrade flow.  - Subclavians: Normal flow hemodynamics were seen in the right subclavian artery.    Attempted PCI 01/29/2024:   Prox RCA to Mid RCA lesion is 100% stenosed.   Post intervention, there is a 100% residual stenosis.   The radiation dose exceeded thresholds defined in the Patient Radiation Dose Management For Interventional Medical Procedures With Extensive Use  of Fluoroscopy policy. Specific follow up instructions will be provided to the patient prior to discharge.   Unsuccessful CTO PCI of the RCA - unable to cross with a wire   Plan: will observe overnight. Can DC Plavix . Recommend medical  therapy   Coronary angiography 01/16/2024: LM: Moderate calcification, no significant stenosis LAD: Moderate calcification, tortuous vessel with minimal luminal irregularities.  No significant stenosis Lcx: No significant stenosis RCA: Dominant vessel with proximal 99% stenosis, followed by chronic total occlusion.          Left to right collaterals from LAD septal branches, feeling distal RCA and beyond   LVEDP 10 mmHg  Unsuccessful attempt of antegrade approach with Prowater and TelePort microcatheter support. Decided not to attempt further to avoid any hematoma. Discussed with Dr. Swaziland. Will arrange for CTO intervention on April 3 on May 8.    NM PET CT Cardiac Perfusion Study 01/01/2024:   Medium, moderate reversible defect in the basal to mid inferior segments and basal inferolateral segment consistent with ischemia. LVEF is moderately reduced with increase at stress (EF 33%->36%). No TID. MBFR is mildly abnormal (1.92). Overall, these findings are high risk due to ischemia, abnormal MBFR, and resting LVEF <35%.   LV perfusion is abnormal. There is evidence of ischemia. There is no evidence of infarction. Defect 1: There is a medium defect with moderate reduction in uptake present in the mid to basal inferior and inferolateral location(s) that is reversible. There is abnormal wall motion in the defect area. Consistent with ischemia.   Rest left ventricular function is abnormal. Rest global function is moderately reduced. There were no regional wall motion abnormalities. Rest EF: 33%. Stress left ventricular function is abnormal. Stress global function is moderately reduced. There were no regional wall abnormalities. Stress EF: 36%. End diastolic cavity size is normal.   Myocardial blood flow was computed to be 1.63ml/g/min at rest and 2.55ml/g/min at stress. Global myocardial blood flow reserve was 1.92 and was mildly abnormal.   Coronary calcium  was present on the attenuation correction CT  images. Severe coronary calcifications were present. Coronary calcifications were present in the left anterior descending artery and right coronary artery distribution(s).   Findings are consistent with ischemia. The study is high risk.   Echo 10/09/2023: IMPRESSIONS   1. Left ventricular ejection fraction, by estimation, is 55 to 60%. The  left ventricle has normal function. The left ventricle has no regional  wall motion abnormalities. There is mild left ventricular hypertrophy.  Left ventricular diastolic parameters  are consistent with Grade I diastolic dysfunction (impaired relaxation).   2. Right ventricular systolic function is normal. The right ventricular  size is normal. Tricuspid regurgitation signal is inadequate for assessing  PA pressure.   3. The mitral valve is normal in structure. Trivial mitral valve  regurgitation. No evidence of mitral stenosis.   4. The aortic valve is tricuspid. Aortic valve regurgitation is not  visualized. No aortic stenosis is present.    Past Medical History:  Diagnosis Date   Anginal pain (HCC)    Carotid artery occlusion    COPD (chronic obstructive pulmonary disease) (HCC)    Coronary artery disease    Dyspnea    with exertion   Erectile dysfunction    Full dentures    GERD (gastroesophageal reflux disease)    Hyperlipidemia    Hypertension    Peripheral vascular disease (HCC)    Tobacco use disorder     Past Surgical History:  Procedure Laterality Date  carotid artery surgery  2012   stent in left side   COLONOSCOPY  2007   CORONARY CTO INTERVENTION N/A 01/29/2024   Procedure: CORONARY CTO INTERVENTION;  Surgeon: Swaziland, Peter M, MD;  Location: [facility] INVASIVE CV LAB;  Service: Cardiovascular;  Laterality: N/A;   ELBOW SURGERY  teenager   left   LEFT HEART CATH AND CORONARY ANGIOGRAPHY N/A 01/16/2024   Procedure: LEFT HEART CATH AND CORONARY ANGIOGRAPHY;  Surgeon: Elmira Newman PARAS, MD;  Location: [facility] INVASIVE CV LAB;  Service:  Cardiovascular;  Laterality: N/A;    MEDICATIONS:  albuterol  (VENTOLIN  HFA) 108 (90 Base) MCG/ACT inhaler   amLODipine  (NORVASC ) 5 MG tablet   BREZTRI  AEROSPHERE 160-9-4.8 MCG/ACT AERO inhaler   clopidogrel  (PLAVIX ) 75 MG tablet   ezetimibe  (ZETIA ) 10 MG tablet   losartan  (COZAAR ) 50 MG tablet   metoprolol  succinate (TOPROL  XL) 50 MG 24 hr tablet   nitroGLYCERIN  (NITROSTAT ) 0.4 MG SL tablet   ranolazine  (RANEXA ) 500 MG 12 hr tablet   rosuvastatin  (CRESTOR ) 40 MG tablet   No current facility-administered medications for this encounter.    Isaiah Ruder, PA-C Surgical Short Stay/Anesthesiology [facility] Phone [phone] [facility] Phone [phone] 07/06/2024 6:11 PM

## 2024-07-07 ENCOUNTER — Encounter (HOSPITAL_COMMUNITY)
Admission: RE | Disposition: A | Discharge status: Home / Self Care | Payer: Self-pay | Source: Home / Self Care | Attending: Surgery

## 2024-07-07 ENCOUNTER — Inpatient Hospital Stay (HOSPITAL_COMMUNITY): Payer: Self-pay | Admitting: Vascular Surgery

## 2024-07-07 ENCOUNTER — Inpatient Hospital Stay (HOSPITAL_COMMUNITY)

## 2024-07-07 ENCOUNTER — Other Ambulatory Visit: Payer: Self-pay

## 2024-07-07 ENCOUNTER — Inpatient Hospital Stay (HOSPITAL_COMMUNITY)
Admission: RE | Admit: 2024-07-07 | Discharge: 2024-07-12 | DRG: 269 | Disposition: A | Discharge status: Home / Self Care | Attending: Surgery | Admitting: Surgery

## 2024-07-07 DIAGNOSIS — I70221 Atherosclerosis of native arteries of extremities with rest pain, right leg: Secondary | ICD-10-CM | POA: Diagnosis not present

## 2024-07-07 DIAGNOSIS — I708 Atherosclerosis of other arteries: Secondary | ICD-10-CM | POA: Diagnosis not present

## 2024-07-07 DIAGNOSIS — N179 Acute kidney failure, unspecified: Secondary | ICD-10-CM | POA: Diagnosis present

## 2024-07-07 DIAGNOSIS — I10 Essential (primary) hypertension: Secondary | ICD-10-CM | POA: Diagnosis present

## 2024-07-07 DIAGNOSIS — Z801 Family history of malignant neoplasm of trachea, bronchus and lung: Secondary | ICD-10-CM

## 2024-07-07 DIAGNOSIS — I70213 Atherosclerosis of native arteries of extremities with intermittent claudication, bilateral legs: Secondary | ICD-10-CM | POA: Diagnosis present

## 2024-07-07 DIAGNOSIS — E7800 Pure hypercholesterolemia, unspecified: Secondary | ICD-10-CM | POA: Diagnosis present

## 2024-07-07 DIAGNOSIS — K219 Gastro-esophageal reflux disease without esophagitis: Secondary | ICD-10-CM | POA: Diagnosis not present

## 2024-07-07 DIAGNOSIS — E1151 Type 2 diabetes mellitus with diabetic peripheral angiopathy without gangrene: Secondary | ICD-10-CM | POA: Diagnosis present

## 2024-07-07 DIAGNOSIS — D500 Iron deficiency anemia secondary to blood loss (chronic): Secondary | ICD-10-CM | POA: Diagnosis present

## 2024-07-07 DIAGNOSIS — I4891 Unspecified atrial fibrillation: Secondary | ICD-10-CM | POA: Diagnosis not present

## 2024-07-07 DIAGNOSIS — Z79899 Other long term (current) drug therapy: Secondary | ICD-10-CM | POA: Diagnosis not present

## 2024-07-07 DIAGNOSIS — E875 Hyperkalemia: Secondary | ICD-10-CM | POA: Diagnosis present

## 2024-07-07 DIAGNOSIS — Z9981 Dependence on supplemental oxygen: Secondary | ICD-10-CM | POA: Diagnosis not present

## 2024-07-07 DIAGNOSIS — Z7951 Long term (current) use of inhaled steroids: Secondary | ICD-10-CM

## 2024-07-07 DIAGNOSIS — Z825 Family history of asthma and other chronic lower respiratory diseases: Secondary | ICD-10-CM

## 2024-07-07 DIAGNOSIS — Z87891 Personal history of nicotine dependence: Secondary | ICD-10-CM | POA: Diagnosis not present

## 2024-07-07 DIAGNOSIS — Z7901 Long term (current) use of anticoagulants: Secondary | ICD-10-CM

## 2024-07-07 DIAGNOSIS — Z7902 Long term (current) use of antithrombotics/antiplatelets: Secondary | ICD-10-CM | POA: Diagnosis not present

## 2024-07-07 DIAGNOSIS — Z8616 Personal history of COVID-19: Secondary | ICD-10-CM

## 2024-07-07 DIAGNOSIS — I2510 Atherosclerotic heart disease of native coronary artery without angina pectoris: Secondary | ICD-10-CM | POA: Diagnosis present

## 2024-07-07 DIAGNOSIS — I480 Paroxysmal atrial fibrillation: Secondary | ICD-10-CM | POA: Diagnosis present

## 2024-07-07 DIAGNOSIS — I6523 Occlusion and stenosis of bilateral carotid arteries: Secondary | ICD-10-CM | POA: Diagnosis present

## 2024-07-07 DIAGNOSIS — I739 Peripheral vascular disease, unspecified: Secondary | ICD-10-CM | POA: Diagnosis not present

## 2024-07-07 DIAGNOSIS — I700 Atherosclerosis of aorta: Secondary | ICD-10-CM | POA: Diagnosis not present

## 2024-07-07 DIAGNOSIS — I745 Embolism and thrombosis of iliac artery: Secondary | ICD-10-CM | POA: Diagnosis present

## 2024-07-07 DIAGNOSIS — Z95828 Presence of other vascular implants and grafts: Principal | ICD-10-CM

## 2024-07-07 DIAGNOSIS — I97191 Other postprocedural cardiac functional disturbances following other surgery: Secondary | ICD-10-CM | POA: Diagnosis not present

## 2024-07-07 DIAGNOSIS — D62 Acute posthemorrhagic anemia: Secondary | ICD-10-CM | POA: Diagnosis not present

## 2024-07-07 DIAGNOSIS — I493 Ventricular premature depolarization: Secondary | ICD-10-CM | POA: Diagnosis not present

## 2024-07-07 DIAGNOSIS — Z8249 Family history of ischemic heart disease and other diseases of the circulatory system: Secondary | ICD-10-CM | POA: Diagnosis not present

## 2024-07-07 DIAGNOSIS — I714 Abdominal aortic aneurysm, without rupture, unspecified: Secondary | ICD-10-CM | POA: Diagnosis present

## 2024-07-07 DIAGNOSIS — E785 Hyperlipidemia, unspecified: Secondary | ICD-10-CM | POA: Diagnosis not present

## 2024-07-07 DIAGNOSIS — I719 Aortic aneurysm of unspecified site, without rupture: Secondary | ICD-10-CM | POA: Diagnosis not present

## 2024-07-07 HISTORY — PX: PATCH ANGIOPLASTY: SHX6230

## 2024-07-07 HISTORY — PX: ENDARTERECTOMY FEMORAL: SHX5804

## 2024-07-07 HISTORY — PX: ABDOMINAL AORTIC ENDOVASCULAR STENT GRAFT: SHX5707

## 2024-07-07 HISTORY — PX: FEMORAL-FEMORAL BYPASS GRAFT: SHX936

## 2024-07-07 LAB — CBC
HCT: 33.7 % — ABNORMAL LOW (ref 39.0–52.0)
Hemoglobin: 11 g/dL — ABNORMAL LOW (ref 13.0–17.0)
MCH: 30.4 pg (ref 26.0–34.0)
MCHC: 32.6 g/dL (ref 30.0–36.0)
MCV: 93.1 fL (ref 80.0–100.0)
Platelets: 150 K/uL (ref 150–400)
RBC: 3.62 MIL/uL — ABNORMAL LOW (ref 4.22–5.81)
RDW: 13.6 % (ref 11.5–15.5)
WBC: 8 K/uL (ref 4.0–10.5)
nRBC: 0 % (ref 0.0–0.2)

## 2024-07-07 LAB — CREATININE, SERUM
Creatinine, Ser: 1.36 mg/dL — ABNORMAL HIGH (ref 0.61–1.24)
GFR, Estimated: 57 mL/min — ABNORMAL LOW (ref >60)

## 2024-07-07 SURGERY — INSERTION, ENDOVASCULAR STENT GRAFT, AORTA, ABDOMINAL
Anesthesia: General | Site: Groin

## 2024-07-07 MED ORDER — ALBUTEROL SULFATE (2.5 MG/3ML) 0.083% IN NEBU: 3.0000 mL | INHALATION_SOLUTION | Freq: Four times a day (QID) | RESPIRATORY_TRACT | Status: DC | PRN

## 2024-07-07 MED ORDER — ORAL CARE MOUTH RINSE: 15.0000 mL | Freq: Once | OROMUCOSAL | Status: AC

## 2024-07-07 MED ORDER — OXYCODONE-ACETAMINOPHEN 5-325 MG PO TABS
1.0000 | ORAL_TABLET | ORAL | Status: DC | PRN
  Administered 2024-07-07: 1 via ORAL
  Administered 2024-07-08 – 2024-07-11 (×5): 2 via ORAL
  Administered 2024-07-11 – 2024-07-12 (×2): 1 via ORAL
  Filled 2024-07-07 (×2): qty 2
  Filled 2024-07-07: qty 1
  Filled 2024-07-07: qty 2
  Filled 2024-07-07: qty 1
  Filled 2024-07-07: qty 2
  Filled 2024-07-07: qty 1
  Filled 2024-07-07: qty 2

## 2024-07-07 MED ORDER — ACETAMINOPHEN 10 MG/ML IV SOLN
INTRAVENOUS | Status: DC | PRN
  Administered 2024-07-07: 1000 mg via INTRAVENOUS

## 2024-07-07 MED ORDER — CHLORHEXIDINE GLUCONATE 0.12 % MT SOLN
OROMUCOSAL | Status: AC
  Administered 2024-07-07: 15 mL via OROMUCOSAL
  Filled 2024-07-07: qty 15

## 2024-07-07 MED ORDER — ONDANSETRON HCL 4 MG/2ML IJ SOLN
4.0000 mg | Freq: Four times a day (QID) | INTRAMUSCULAR | Status: DC | PRN
  Administered 2024-07-07: 4 mg via INTRAVENOUS
  Filled 2024-07-07: qty 2

## 2024-07-07 MED ORDER — HEPARIN 6000 UNIT IRRIGATION SOLUTION
Status: AC
Start: 2024-07-07 — End: 2024-07-07
  Filled 2024-07-07: qty 500

## 2024-07-07 MED ORDER — SODIUM CHLORIDE 0.9 % IV SOLN: INTRAVENOUS | Status: AC

## 2024-07-07 MED ORDER — BISACODYL 10 MG RE SUPP: 10.0000 mg | Freq: Every day | RECTAL | Status: DC | PRN

## 2024-07-07 MED ORDER — CEFAZOLIN SODIUM-DEXTROSE 2-4 GM/100ML-% IV SOLN
INTRAVENOUS | Status: AC
  Filled 2024-07-07: qty 100

## 2024-07-07 MED ORDER — ACETAMINOPHEN 325 MG PO TABS: 325.0000 mg | ORAL_TABLET | ORAL | Status: DC | PRN

## 2024-07-07 MED ORDER — CHLORHEXIDINE GLUCONATE CLOTH 2 % EX PADS: 6.0000 | MEDICATED_PAD | Freq: Once | CUTANEOUS | Status: DC

## 2024-07-07 MED ORDER — AMLODIPINE BESYLATE 5 MG PO TABS
5.0000 mg | ORAL_TABLET | Freq: Every day | ORAL | Status: DC
  Administered 2024-07-08 – 2024-07-12 (×5): 5 mg via ORAL
  Filled 2024-07-07 (×5): qty 1

## 2024-07-07 MED ORDER — 0.9 % SODIUM CHLORIDE (POUR BTL) OPTIME
TOPICAL | Status: DC | PRN
  Administered 2024-07-07: 2000 mL

## 2024-07-07 MED ORDER — EPHEDRINE SULFATE-NACL 50-0.9 MG/10ML-% IV SOSY
PREFILLED_SYRINGE | INTRAVENOUS | Status: DC | PRN
  Administered 2024-07-07 (×2): 10 mg via INTRAVENOUS
  Administered 2024-07-07: 5 mg via INTRAVENOUS

## 2024-07-07 MED ORDER — DOCUSATE SODIUM 100 MG PO CAPS
100.0000 mg | ORAL_CAPSULE | Freq: Every day | ORAL | Status: DC
  Administered 2024-07-08 – 2024-07-10 (×3): 100 mg via ORAL
  Filled 2024-07-07 (×5): qty 1

## 2024-07-07 MED ORDER — DEXAMETHASONE SODIUM PHOSPHATE 10 MG/ML IJ SOLN
INTRAMUSCULAR | Status: AC
  Filled 2024-07-07: qty 1

## 2024-07-07 MED ORDER — NITROGLYCERIN 0.4 MG SL SUBL: 0.4000 mg | SUBLINGUAL_TABLET | SUBLINGUAL | Status: DC | PRN

## 2024-07-07 MED ORDER — HEPARIN 6000 UNIT IRRIGATION SOLUTION
Status: DC | PRN
  Administered 2024-07-07: 1

## 2024-07-07 MED ORDER — PROTAMINE SULFATE 10 MG/ML IV SOLN
INTRAVENOUS | Status: DC | PRN
  Administered 2024-07-07: 50 mg via INTRAVENOUS

## 2024-07-07 MED ORDER — EZETIMIBE 10 MG PO TABS
10.0000 mg | ORAL_TABLET | Freq: Every day | ORAL | Status: DC
Start: 2024-07-07 — End: 2024-07-12
  Administered 2024-07-07 – 2024-07-12 (×6): 10 mg via ORAL
  Filled 2024-07-07 (×6): qty 1

## 2024-07-07 MED ORDER — ROCURONIUM BROMIDE 10 MG/ML (PF) SYRINGE
PREFILLED_SYRINGE | INTRAVENOUS | Status: AC
  Filled 2024-07-07: qty 10

## 2024-07-07 MED ORDER — RANOLAZINE ER 500 MG PO TB12
500.0000 mg | ORAL_TABLET | Freq: Two times a day (BID) | ORAL | Status: DC
  Administered 2024-07-07 – 2024-07-12 (×10): 500 mg via ORAL
  Filled 2024-07-07 (×10): qty 1

## 2024-07-07 MED ORDER — DEXAMETHASONE SODIUM PHOSPHATE 10 MG/ML IJ SOLN
INTRAMUSCULAR | Status: DC | PRN
  Administered 2024-07-07: 10 mg via INTRAVENOUS

## 2024-07-07 MED ORDER — PHENYLEPHRINE HCL-NACL 20-0.9 MG/250ML-% IV SOLN
INTRAVENOUS | Status: DC | PRN
  Administered 2024-07-07: 15 ug/min via INTRAVENOUS

## 2024-07-07 MED ORDER — CEFAZOLIN SODIUM-DEXTROSE 2-4 GM/100ML-% IV SOLN
2.0000 g | Freq: Three times a day (TID) | INTRAVENOUS | Status: AC
  Administered 2024-07-07 – 2024-07-08 (×2): 2 g via INTRAVENOUS
  Filled 2024-07-07 (×2): qty 100

## 2024-07-07 MED ORDER — PHENYLEPHRINE 80 MCG/ML (10ML) SYRINGE FOR IV PUSH (FOR BLOOD PRESSURE SUPPORT)
PREFILLED_SYRINGE | INTRAVENOUS | Status: DC | PRN
  Administered 2024-07-07 (×4): 160 ug via INTRAVENOUS

## 2024-07-07 MED ORDER — SODIUM CHLORIDE 0.9 % IV SOLN: INTRAVENOUS | Status: DC

## 2024-07-07 MED ORDER — SODIUM CHLORIDE 0.9 % IV SOLN: INTRAVENOUS | Status: DC | PRN

## 2024-07-07 MED ORDER — EPHEDRINE 5 MG/ML INJ
INTRAVENOUS | Status: AC
  Filled 2024-07-07: qty 5

## 2024-07-07 MED ORDER — FENTANYL CITRATE (PF) 250 MCG/5ML IJ SOLN
INTRAMUSCULAR | Status: AC
  Filled 2024-07-07: qty 5

## 2024-07-07 MED ORDER — METOPROLOL TARTRATE 5 MG/5ML IV SOLN
2.5000 mg | INTRAVENOUS | Status: AC | PRN
  Administered 2024-07-08 (×2): 5 mg via INTRAVENOUS
  Filled 2024-07-07 (×2): qty 5

## 2024-07-07 MED ORDER — ACETAMINOPHEN 650 MG RE SUPP: 325.0000 mg | RECTAL | Status: DC | PRN

## 2024-07-07 MED ORDER — LABETALOL HCL 5 MG/ML IV SOLN
INTRAVENOUS | Status: DC | PRN
  Administered 2024-07-07: 7.5 mg via INTRAVENOUS

## 2024-07-07 MED ORDER — PHENOL 1.4 % MT LIQD: 1.0000 | OROMUCOSAL | Status: DC | PRN

## 2024-07-07 MED ORDER — ONDANSETRON HCL 4 MG/2ML IJ SOLN
INTRAMUSCULAR | Status: DC | PRN
  Administered 2024-07-07: 4 mg via INTRAVENOUS

## 2024-07-07 MED ORDER — FENTANYL CITRATE (PF) 250 MCG/5ML IJ SOLN
INTRAMUSCULAR | Status: DC | PRN
  Administered 2024-07-07: 50 ug via INTRAVENOUS
  Administered 2024-07-07: 100 ug via INTRAVENOUS
  Administered 2024-07-07: 50 ug via INTRAVENOUS

## 2024-07-07 MED ORDER — HEPARIN SODIUM (PORCINE) 1000 UNIT/ML IJ SOLN
INTRAMUSCULAR | Status: AC
Start: 2024-07-07 — End: 2024-07-07
  Filled 2024-07-07: qty 10

## 2024-07-07 MED ORDER — OXYCODONE HCL 5 MG PO TABS: 5.0000 mg | ORAL_TABLET | Freq: Once | ORAL | Status: DC | PRN

## 2024-07-07 MED ORDER — LABETALOL HCL 5 MG/ML IV SOLN: 10.0000 mg | INTRAVENOUS | Status: DC | PRN

## 2024-07-07 MED ORDER — HYDRALAZINE HCL 20 MG/ML IJ SOLN: 5.0000 mg | INTRAMUSCULAR | Status: DC | PRN

## 2024-07-07 MED ORDER — POTASSIUM CHLORIDE CRYS ER 20 MEQ PO TBCR: 40.0000 meq | EXTENDED_RELEASE_TABLET | Freq: Every day | ORAL | Status: DC | PRN

## 2024-07-07 MED ORDER — SUGAMMADEX SODIUM 200 MG/2ML IV SOLN
INTRAVENOUS | Status: DC | PRN
  Administered 2024-07-07: 200 mg via INTRAVENOUS

## 2024-07-07 MED ORDER — ROSUVASTATIN CALCIUM 20 MG PO TABS
40.0000 mg | ORAL_TABLET | Freq: Every day | ORAL | Status: DC
  Administered 2024-07-07 – 2024-07-12 (×6): 40 mg via ORAL
  Filled 2024-07-07 (×6): qty 2

## 2024-07-07 MED ORDER — CEFAZOLIN SODIUM-DEXTROSE 2-4 GM/100ML-% IV SOLN
2.0000 g | INTRAVENOUS | Status: AC
Start: 2024-07-07 — End: 2024-07-07
  Administered 2024-07-07: 2 g via INTRAVENOUS

## 2024-07-07 MED ORDER — CHLORHEXIDINE GLUCONATE 0.12 % MT SOLN: 15.0000 mL | Freq: Once | OROMUCOSAL | Status: AC

## 2024-07-07 MED ORDER — HEPARIN SODIUM (PORCINE) 1000 UNIT/ML IJ SOLN
INTRAMUSCULAR | Status: DC | PRN
  Administered 2024-07-07 (×2): 2000 [IU] via INTRAVENOUS
  Administered 2024-07-07: 8000 [IU] via INTRAVENOUS
  Administered 2024-07-07: 2000 [IU] via INTRAVENOUS

## 2024-07-07 MED ORDER — MIDAZOLAM HCL 2 MG/2ML IJ SOLN
INTRAMUSCULAR | Status: AC
  Filled 2024-07-07: qty 2

## 2024-07-07 MED ORDER — HYDROMORPHONE HCL 1 MG/ML IJ SOLN
0.5000 mg | INTRAMUSCULAR | Status: DC | PRN
  Administered 2024-07-07 – 2024-07-08 (×2): 0.5 mg via INTRAVENOUS
  Filled 2024-07-07 (×2): qty 0.5

## 2024-07-07 MED ORDER — PHENYLEPHRINE 80 MCG/ML (10ML) SYRINGE FOR IV PUSH (FOR BLOOD PRESSURE SUPPORT)
PREFILLED_SYRINGE | INTRAVENOUS | Status: AC
  Filled 2024-07-07: qty 20

## 2024-07-07 MED ORDER — HEPARIN SODIUM (PORCINE) 1000 UNIT/ML IJ SOLN
INTRAMUSCULAR | Status: AC
  Filled 2024-07-07: qty 10

## 2024-07-07 MED ORDER — SODIUM CHLORIDE 0.9 % IV SOLN: 500.0000 mL | Freq: Once | INTRAVENOUS | Status: DC | PRN

## 2024-07-07 MED ORDER — SURGIFLO WITH THROMBIN (HEMOSTATIC MATRIX KIT) OPTIME
TOPICAL | Status: DC | PRN
  Administered 2024-07-07: 2 via TOPICAL

## 2024-07-07 MED ORDER — LIDOCAINE 2% (20 MG/ML) 5 ML SYRINGE
INTRAMUSCULAR | Status: AC
  Filled 2024-07-07: qty 5

## 2024-07-07 MED ORDER — ACETAMINOPHEN 10 MG/ML IV SOLN: 1000.0000 mg | Freq: Once | INTRAVENOUS | Status: DC | PRN

## 2024-07-07 MED ORDER — FENTANYL CITRATE (PF) 100 MCG/2ML IJ SOLN: 25.0000 ug | INTRAMUSCULAR | Status: DC | PRN

## 2024-07-07 MED ORDER — MIDAZOLAM HCL 2 MG/2ML IJ SOLN
INTRAMUSCULAR | Status: DC | PRN
  Administered 2024-07-07: 2 mg via INTRAVENOUS

## 2024-07-07 MED ORDER — ONDANSETRON HCL 4 MG/2ML IJ SOLN
INTRAMUSCULAR | Status: AC
  Filled 2024-07-07: qty 2

## 2024-07-07 MED ORDER — LACTATED RINGERS IV SOLN: INTRAVENOUS | Status: DC | PRN

## 2024-07-07 MED ORDER — LIDOCAINE 2% (20 MG/ML) 5 ML SYRINGE
INTRAMUSCULAR | Status: DC | PRN
  Administered 2024-07-07: 60 mg via INTRAVENOUS

## 2024-07-07 MED ORDER — BUDESON-GLYCOPYRROL-FORMOTEROL 160-9-4.8 MCG/ACT IN AERO
2.0000 | INHALATION_SPRAY | Freq: Two times a day (BID) | RESPIRATORY_TRACT | Status: DC
  Administered 2024-07-07 – 2024-07-10 (×5): 2 via RESPIRATORY_TRACT
  Filled 2024-07-07: qty 5.9

## 2024-07-07 MED ORDER — PROPOFOL 10 MG/ML IV BOLUS
INTRAVENOUS | Status: AC
  Filled 2024-07-07: qty 20

## 2024-07-07 MED ORDER — HEPARIN SODIUM (PORCINE) 5000 UNIT/ML IJ SOLN
5000.0000 [IU] | Freq: Three times a day (TID) | INTRAMUSCULAR | Status: DC
  Administered 2024-07-08: 5000 [IU] via SUBCUTANEOUS
  Filled 2024-07-07: qty 1

## 2024-07-07 MED ORDER — POLYETHYLENE GLYCOL 3350 17 G PO PACK: 17.0000 g | PACK | Freq: Every day | ORAL | Status: DC | PRN

## 2024-07-07 MED ORDER — CLOPIDOGREL BISULFATE 75 MG PO TABS
75.0000 mg | ORAL_TABLET | Freq: Every day | ORAL | Status: DC
Start: 2024-07-08 — End: 2024-07-12
  Administered 2024-07-08 – 2024-07-12 (×5): 75 mg via ORAL
  Filled 2024-07-07 (×5): qty 1

## 2024-07-07 MED ORDER — PROPOFOL 10 MG/ML IV BOLUS
INTRAVENOUS | Status: DC | PRN
  Administered 2024-07-07: 50 mg via INTRAVENOUS
  Administered 2024-07-07: 90 mg via INTRAVENOUS

## 2024-07-07 MED ORDER — IODIXANOL 320 MG/ML IV SOLN
INTRAVENOUS | Status: DC | PRN
  Administered 2024-07-07: 52 mL via INTRA_ARTERIAL

## 2024-07-07 MED ORDER — ROCURONIUM BROMIDE 10 MG/ML (PF) SYRINGE
PREFILLED_SYRINGE | INTRAVENOUS | Status: DC | PRN
Start: 2024-07-07 — End: 2024-07-07
  Administered 2024-07-07 (×2): 10 mg via INTRAVENOUS
  Administered 2024-07-07: 20 mg via INTRAVENOUS
  Administered 2024-07-07 (×2): 10 mg via INTRAVENOUS
  Administered 2024-07-07: 70 mg via INTRAVENOUS

## 2024-07-07 MED ORDER — OXYCODONE HCL 5 MG/5ML PO SOLN: 5.0000 mg | Freq: Once | ORAL | Status: DC | PRN

## 2024-07-07 MED ORDER — LACTATED RINGERS IV SOLN: INTRAVENOUS | Status: DC

## 2024-07-07 MED ORDER — ALBUMIN HUMAN 5 % IV SOLN: INTRAVENOUS | Status: DC | PRN

## 2024-07-07 SURGICAL SUPPLY — 74 items
BAG COUNTER SPONGE SURGICOUNT (BAG) ×5 IMPLANT
BLADE CLIPPER SURG (BLADE) ×5 IMPLANT
CANISTER SUCTION 3000ML PPV (SUCTIONS) ×5 IMPLANT
CATH BEACON 5 .035 65 KMP TIP (CATHETERS) IMPLANT
CATH BEACON 5.038 65CM KMP-01 (CATHETERS) ×5 IMPLANT
CATH EMB 4FR 40 (CATHETERS) IMPLANT
CATH OMNI FLUSH .035X70CM (CATHETERS) ×5 IMPLANT
CATH SHOCKWAVE E8 6X80 (CATHETERS) IMPLANT
CATH SHOCKWAVE M5 8.0X60 (CATHETERS) IMPLANT
CLIP TI MEDIUM 24 (CLIP) ×5 IMPLANT
CLIP TI WIDE RED SMALL 24 (CLIP) ×5 IMPLANT
DERMABOND ADVANCED .7 DNX12 (GAUZE/BANDAGES/DRESSINGS) ×5 IMPLANT
DEVICE CLOSURE PERCLS PRGLD 6F (VASCULAR PRODUCTS) ×20 IMPLANT
DEVICE TORQUE H2O (MISCELLANEOUS) IMPLANT
DRSG TEGADERM 2-3/8X2-3/4 SM (GAUZE/BANDAGES/DRESSINGS) ×10 IMPLANT
ELECT CAUTERY BLADE 6.4 (BLADE) ×5 IMPLANT
ELECTRODE REM PT RTRN 9FT ADLT (ELECTROSURGICAL) ×10 IMPLANT
EVACUATOR SILICONE 100CC (DRAIN) IMPLANT
GAUZE SPONGE 2X2 8PLY STRL LF (GAUZE/BANDAGES/DRESSINGS) ×10 IMPLANT
GLOVE BIOGEL PI IND STRL 8 (GLOVE) ×5 IMPLANT
GLOVE SURG SS PI 7.5 STRL IVOR (GLOVE) ×5 IMPLANT
GOWN STRL REUS W/ TWL LRG LVL3 (GOWN DISPOSABLE) ×10 IMPLANT
GOWN STRL REUS W/ TWL XL LVL3 (GOWN DISPOSABLE) ×10 IMPLANT
GRAFT BALLN CATH 65CM (BALLOONS) ×5 IMPLANT
GRAFT ENDOVASC ZENITH 24X113 (Endovascular Graft) IMPLANT
GRAFT SKIN WND MICRO 38 (Tissue) IMPLANT
GRAFT VASC STRG 30X8KNIT (Vascular Products) IMPLANT
GUIDEWIRE ANGLED .035X150CM (WIRE) IMPLANT
HEMOSTAT SNOW SURGICEL 2X4 (HEMOSTASIS) IMPLANT
KIT BASIN OR (CUSTOM PROCEDURE TRAY) ×5 IMPLANT
KIT DRAIN CSF ACCUDRAIN (MISCELLANEOUS) IMPLANT
KIT ENCORE 26 ADVANTAGE (KITS) IMPLANT
KIT TURNOVER KIT B (KITS) ×5 IMPLANT
LOOP VESSEL MINI RED (MISCELLANEOUS) IMPLANT
NDL PERC 18GX7CM (NEEDLE) IMPLANT
NEEDLE PERC 18GX7CM (NEEDLE) ×4 IMPLANT
NS IRRIG 1000ML POUR BTL (IV SOLUTION) ×10 IMPLANT
PACK ENDOVASCULAR (PACKS) ×5 IMPLANT
PACK PERIPHERAL VASCULAR (CUSTOM PROCEDURE TRAY) ×5 IMPLANT
PAD ARMBOARD POSITIONER FOAM (MISCELLANEOUS) ×10 IMPLANT
PATCH HEMASHIELD 8X75 (Vascular Products) IMPLANT
PENCIL BUTTON HOLSTER BLD 10FT (ELECTRODE) ×5 IMPLANT
PENCIL SMOKE EVACUATOR (MISCELLANEOUS) IMPLANT
SET MICROPUNCTURE 5F STIFF (MISCELLANEOUS) ×5 IMPLANT
SHEATH BRITE TIP 8FR 23CM (SHEATH) ×5 IMPLANT
SHEATH DRYSEAL FLEX 18FR 33CM (SHEATH) IMPLANT
SHEATH PINNACLE 8F 10CM (SHEATH) ×5 IMPLANT
SLEEVE ISOL F/PACE RF HD COVER (MISCELLANEOUS) IMPLANT
SLEEVE SURGEON STRL (DRAPES) IMPLANT
STENT VIABAHN 11X59X135 (Permanent Stent) IMPLANT
STOPCOCK 4 WAY LG BORE MALE ST (IV SETS) IMPLANT
STOPCOCK MORSE 400PSI 3WAY (MISCELLANEOUS) ×5 IMPLANT
SURGIFLO W/THROMBIN 8M KIT (HEMOSTASIS) IMPLANT
SUT PROLENE 5 0 C 1 24 (SUTURE) ×10 IMPLANT
SUT PROLENE 6 0 BV (SUTURE) ×5 IMPLANT
SUT PROLENE 7 0 BV1 MDA (SUTURE) IMPLANT
SUT VIC AB 2-0 CT1 TAPERPNT 27 (SUTURE) ×10 IMPLANT
SUT VIC AB 3-0 SH 27X BRD (SUTURE) ×10 IMPLANT
SUT VIC AB 4-0 PS2 18 (SUTURE) IMPLANT
SYR 20ML LL LF (SYRINGE) ×5 IMPLANT
SYR 3ML LL SCALE MARK (SYRINGE) IMPLANT
SYR BULB IRRIG 60ML STRL (SYRINGE) IMPLANT
TAPE UMBILICAL 1/8X30 (MISCELLANEOUS) ×5 IMPLANT
TOWEL GREEN STERILE (TOWEL DISPOSABLE) ×5 IMPLANT
TRAY FOLEY MTR SLVR 14FR STAT (SET/KITS/TRAYS/PACK) IMPLANT
TRAY FOLEY MTR SLVR 16FR STAT (SET/KITS/TRAYS/PACK) ×5 IMPLANT
TUBING HIGH PRESSURE 120CM (CONNECTOR) ×5 IMPLANT
TUBING INJECTOR 48 (MISCELLANEOUS) IMPLANT
WATER STERILE IRR 1000ML POUR (IV SOLUTION) ×5 IMPLANT
WIRE AMPLATZ SS-J .035X180CM (WIRE) ×10 IMPLANT
WIRE BENTSON .035X145CM (WIRE) ×10 IMPLANT
WIRE G LUND 35X260X7 (WIRE) IMPLANT
WIRE SPARTACORE .014X300CM (WIRE) IMPLANT
WIRE STIFF LUNDERQUIST 260CM (WIRE) IMPLANT

## 2024-07-07 NOTE — Discharge Instructions (Signed)
 [facility]  Discharge instructions  Lower Extremity Bypass Surgery  Please refer to the following instruction for your post-procedure care. Your surgeon or physician assistant will discuss any changes with you.  Activity  You are encouraged to walk as much as you can. You can slowly return to normal activities during the month after your surgery. Avoid strenuous activity and heavy lifting until your doctor tells you it's OK. Avoid activities such as vacuuming or swinging a golf club. Do not drive until your doctor give the OK and you are no longer taking prescription pain medications. It is also normal to have difficulty with sleep habits, eating and bowel movement after surgery. These will go away with time.  Bathing/Showering  Shower daily after you go home. Do not soak in a bathtub, hot tub, or swim until the incision heals completely.  Incision Care  Clean your incision with mild soap and water. Shower every day. Pat the area dry with a clean towel. You do not need a bandage unless otherwise instructed. Do not apply any ointments or creams to your incision. If you have open wounds you will be instructed how to care for them or a visiting nurse may be arranged for you. If you have staples or sutures along your incision they will be removed at your post-op appointment. You may have skin glue on your incision. Do not peel it off. It will come off on its own in about one week.  Wash the groin wound with soap and water daily and pat dry. (No tub bath-only shower)  Then put a dry gauze or washcloth in the groin to keep this area dry to help prevent wound infection.  Do this daily and as needed.  Do not use Vaseline or neosporin on your incisions.  Only use soap and water on your incisions and then protect and keep dry.  Diet  Resume your normal diet. There are no special food restrictions following this procedure. A low fat/ low cholesterol diet is  recommended for all patients with vascular disease. In order to heal from your surgery, it is CRITICAL to get adequate nutrition. Your body requires vitamins, minerals, and protein. Vegetables are the best source of vitamins and minerals. Vegetables also provide the perfect balance of protein. Processed food has little nutritional value, so try to avoid this.  Medications  Resume taking all your medications unless your doctor or physician assistant tells you not to. If your incision is causing pain, you may take over-the-counter pain relievers such as acetaminophen  (Tylenol ). If you were prescribed a stronger pain medication, please aware these medication can cause nausea and constipation. Prevent nausea by taking the medication with a snack or meal. Avoid constipation by drinking plenty of fluids and eating foods with high amount of fiber, such as fruits, vegetables, and grains. Take Colace 100 mg (an over-the-counter stool softener) twice a day as needed for constipation.  Do not take Tylenol  if you are taking prescription pain medications.  Follow Up  Our office will schedule a follow up appointment 2-3 weeks following discharge.  Please call us  immediately for any of the following conditions  Severe or worsening pain in your legs or feet while at rest or while walking Increase pain, redness, warmth, or drainage (pus) from your incision site(s) Fever of 101 degree or higher The swelling in your leg with the bypass suddenly worsens and becomes more painful than when you were in the hospital If you have  been instructed to feel your graft pulse then you should do so every day. If you can no longer feel this pulse, call the office immediately. Not all patients are given this instruction.  Leg swelling is common after leg bypass surgery.  The swelling should improve over a few months following surgery. To improve the swelling, you may elevate your legs above the level of your heart while you are  sitting or resting. Your surgeon or physician assistant may ask you to apply an ACE wrap or wear compression (TED) stockings to help to reduce swelling.  Reduce your risk of vascular disease  Stop smoking. If you would like help call [facility] at 1-800-QUIT-NOW ((623) 338-9492) or [place] at [phone].  Manage your cholesterol Maintain a desired weight Control your diabetes weight Control your diabetes Keep your blood pressure down  If you have any questions, please call the office at [phone]  ------------------------------------------------------------------------------------------------------------------------------------------------------------------------- Information on my medicine - ELIQUIS  (apixaban )  This medication education was reviewed with me or my healthcare representative as part of my discharge preparation.   Why was Eliquis  prescribed for you? Eliquis  was prescribed for you to reduce the risk of forming blood clots that can cause a stroke if you have a medical condition called atrial fibrillation (a type of irregular heartbeat) OR to reduce the risk of a blood clots forming after orthopedic surgery.  What do You need to know about Eliquis  ? Take your Eliquis  TWICE DAILY - one tablet in the morning and one tablet in the evening with or without food.  It would be best to take the doses about the same time each day.  If you have difficulty swallowing the tablet whole please discuss with your pharmacist how to take the medication safely.  Take Eliquis  exactly as prescribed by your doctor and DO NOT stop taking Eliquis  without talking to the doctor who prescribed the medication.  Stopping may increase your risk of developing a new clot or stroke.  Refill your prescription before you run out.  After discharge, you should have regular check-up appointments with your healthcare provider that is prescribing your Eliquis .  In the future your dose may need to be  changed if your kidney function or weight changes by a significant amount or as you get older.  What do you do if you miss a dose? If you miss a dose, take it as soon as you remember on the same day and resume taking twice daily.  Do not take more than one dose of ELIQUIS  at the same time.  Important Safety Information A possible side effect of Eliquis  is bleeding. You should call your healthcare provider right away if you experience any of the following: Bleeding from an injury or your nose that does not stop. Unusual colored urine (red or dark brown) or unusual colored stools (red or black). Unusual bruising for unknown reasons. A serious fall or if you hit your head (even if there is no bleeding).  Some medicines may interact with Eliquis  and might increase your risk of bleeding or clotting while on Eliquis . To help avoid this, consult your healthcare provider or pharmacist prior to using any new prescription or non-prescription medications, including herbals, vitamins, non-steroidal anti-inflammatory drugs (NSAIDs) and supplements.  This website has more information on Eliquis  (apixaban ): http://www.eliquis .com/eliquis dena

## 2024-07-07 NOTE — Progress Notes (Addendum)
  Day of Surgery Note    Subjective:  sleepy but wakes to voice.  When asked, he states his feet feel better   Vitals:   07/07/24 1347 07/07/24 1400  BP: 116/63 (!) 95/57  Pulse: 77 73  Resp: 16 11  Temp:    SpO2: 98% 92%    Incisions:   bilateral groin incisions are clean without hematoma Extremities:  brisk doppler flow bilateral PT; bilateral calves soft Cardiac:  regular Lungs:  non labored Abdomen:  soft   Assessment/Plan:  This is a 68 y.o. male who is s/p  Aortic stent graft/left iliac stent with left to right femoral to femoral bypass 07/07/2024 by Dr. Serene  -pt with brisk doppler flow bilateral feet.  Calves soft.  Incisions look good without hematoma.  -to 4 east later this afternoon. -pt was not on asa prior to surgery-I did order baby asa.  He is on plavix  pre op but will hold this for now.  Continue statin.  -soft pressures, hold antihypertensive meds for now.  IVF @ 75cc/hr -supine x 4 hours.   Lucie Apt, PA-C 07/07/2024 2:03 PM [phone]   I agree with the above.  I have seen and examined the patient in the PACU.  He has triphasic pedal Doppler signals.  He denies any leg pain or neurologic dysfunction.  He is stable for transfer to the floor  [employer]

## 2024-07-07 NOTE — Transfer of Care (Signed)
 Immediate Anesthesia Transfer of Care Note  Patient: Manuel Richardson  Procedure(s) Performed: INSERTION, ENDOVASCULAR STENT GRAFT, AORTA, aneurysms repair CREATION, BYPASS, ARTERIAL, FEMORAL TO FEMORAL, USING GRAFT (Bilateral) ANGIOPLASTY, USING PATCH GRAFT (Left: Groin) Bilateral ENDARTERECTOMY, FEMORAL (Bilateral: Groin) INTRAVASCULAR LITHOTRIPSY, External Iliac Artery (Left: Groin)  Patient Location: PACU  Anesthesia Type:General  Level of Consciousness: awake, alert , and oriented  Airway & Oxygen  Therapy: Patient Spontanous Breathing and Patient connected to face mask oxygen   Post-op Assessment: Report given to RN and Post -op Vital signs reviewed and stable  Post vital signs: Reviewed and stable  Last Vitals:  Vitals Value Taken Time  BP 116/63 07/07/24 13:47  Temp 36.7 C 07/07/24 13:45  Pulse 72 07/07/24 13:53  Resp 9 07/07/24 13:53  SpO2 90 % 07/07/24 13:53  Vitals shown include unfiled device data.  Last Pain:  Vitals:   07/07/24 0655  TempSrc:   PainSc: 0-No pain         Complications: No notable events documented.

## 2024-07-07 NOTE — Interval H&P Note (Signed)
 History and Physical Interval Note:  07/07/2024 8:10 AM  Manuel Richardson  has presented today for surgery, with the diagnosis of atherosclerosis bilateral lower extremeties.  The various methods of treatment have been discussed with the patient and family. After consideration of risks, benefits and other options for treatment, the patient has consented to  Procedure(s): INSERTION, ENDOVASCULAR STENT GRAFT, AORTA, ABDOMINAL (N/A) CREATION, BYPASS, ARTERIAL, FEMORAL TO FEMORAL, USING GRAFT (Bilateral) as a surgical intervention.  The patient's history has been reviewed, patient examined, no change in status, stable for surgery.  I have reviewed the patient's chart and labs.  Questions were answered to the patient's satisfaction.     Malvina New

## 2024-07-07 NOTE — Anesthesia Procedure Notes (Signed)
 Procedure Name: Intubation Date/Time: 07/07/2024 8:55 AM  Performed by: Cayton Cuevas J, CRNAPre-anesthesia Checklist: Patient identified, Emergency Drugs available, Suction available and Patient being monitored Patient Re-evaluated:Patient Re-evaluated prior to induction Oxygen  Delivery Method: Circle System Utilized Preoxygenation: Pre-oxygenation with 100% oxygen  Induction Type: IV induction Ventilation: Mask ventilation without difficulty and Oral airway inserted - appropriate to patient size Laryngoscope Size: Miller and 3 Grade View: Grade I Tube type: Oral Tube size: 8.0 mm Number of attempts: 1 Airway Equipment and Method: Stylet and Oral airway Placement Confirmation: ETT inserted through vocal cords under direct vision, positive ETCO2 and breath sounds checked- equal and bilateral Secured at: 23 cm Tube secured with: Tape Dental Injury: Teeth and Oropharynx as per pre-operative assessment

## 2024-07-07 NOTE — Progress Notes (Signed)
 Patient arrived at the unit from [facility] arrival pt is alert and oriented X4,bruising present on lt groin,rt groin level 0,pulse on foot dopplered,CHG bath given,CCMD notified,vitals taken, pt oriented to the unit,call bell in reach

## 2024-07-07 NOTE — Op Note (Signed)
 Patient name: Manuel Richardson MRN: [id number] DOB: 13-Feb-1956 Sex: male  07/07/2024 Pre-operative Diagnosis: AAA and right leg claudication Post-operative diagnosis:  Same Surgeon:  Malvina New Assistants:  Lucie Apt Procedure:   #1: Endovascular repair of abdominal aortic aneurysm using Aorto-uni-iliac endograft (65158)   #2:  Open bilateral femoral artery exposure (65187)   #3:  Femoral femoral bypass (64338)   #4:  Bilateral femoral endarterectomy   #5:  dacryon patch angioplasty, left femoral artery   #6:  distal extension   #7:  intra-arterial lithotripsy and angioplasty, left external iliac artery   #8:  intra-arterial lithotripsy, left common iliac artery   #9:  placement of first 38 sq cm skin substitute into bilateral groins (keresis)  Anesthesia:  general Blood Loss:  200 cc Specimens:  none  Findings: Heavy calcification at the aortic bifurcation and at the iliac bifurcation.  Both areas were treated with a 6 followed by 8 mm shockwave lithotripsy balloon.  The entire length of the left external iliac artery was treated with a 8 mm shockwave balloon angioplasty.  An AUI device was deployed from the infrarenal location into the left common iliac artery.  This was extended with a Gore VBX 11 x 59.  The patient had extensive plaque in bilateral common femoral arteries.  I performed endarterectomy of the common femoral artery bilaterally with eversion endarterectomy of the profunda.  The plaque was tacked down in the proximal superficial femoral artery bilaterally.  On the left, a dacryon patch was used for repair of the common femoral arteriotomy.  An 8 mm dacryon graft was used for the femorofemoral.  Kerecis was placed in the groin incisions.  Devices Used: Cook AUI 24 x 113.  Left iliac extension was a Gore VBX 11 x 59  Indications: This is a 68 year old gentleman with severe claudication to the right leg that failed nonoperative management.  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  He also has a small aneurysm.  We discussed treatment with an AUI and femorofemoral.  Procedure:  The patient was identified in the holding area and taken to [facility] OR ROOM 16  The patient was then placed supine on the table. general anesthesia was administered.  The patient was prepped and draped in the usual sterile fashion.  A time out was called and antibiotics were administered.  Due to the complexity of the case, a PA was necessary.  She helped with exposure by providing suction and retraction.  She helped with the anastomoses by following the suture.  She helped with wound closure.  Longitudinal incisions were made in bilateral groins after locating the femoral artery with ultrasound.  Cautery was used to divide the subcutaneous tissue down to the femoral sheath which was opened sharply.  Bilateral common femoral arteries were exposed from the inguinal ligament down to the bifurcation.  The proximal SFA and profunda were also dissected free.  The crossing circumflex iliac vein was ligated on the left side under the inguinal ligament.  The patient was then fully heparinized.  I first cannulated the common femoral artery with a micropuncture needle and inserted a 018 wire followed by micropuncture sheath.  Next a Bentson wire was navigated into the aorta with the help of a Berenstein 2 catheter.  I then occluded the left femoral artery with vascular clamps and a #11 blade and Potts scissors were used to extend the arteriotomy.  There was extensive plaque  within the common femoral artery extending up into the external iliac artery.  I performed a endarterectomy using a Zulema Jacquelyn harris.  The endarterectomy went into the superficial femoral artery for approximately 1 cm.  A eversion endarterectomy was performed in the profundofemoral artery.  A Fogarty balloon was advanced centrally to remove as much plaque as possible.  The plaque in the  superficial femoral artery was tacked down with 7-0 Prolene's.  Next a dacryon finesse patch was used to perform patch angioplasty.  I used a 18-gauge needle to cannulate the patch and fed the Bentson wire through this needle.  Once the patch repair was completed, the appropriate flushing maneuvers were performed and the anastomosis was secured.  The clamps were then released.  I then inserted an 8 French sheath over the Bentson wire through the patch.  A wire exchange was performed with a Berenstein catheter and a 014 wire was inserted.  I initially performed shockwave intra-arterial lithotripsy of the aortic bifurcation and the iliac bifurcation with a 6 mm balloon.  I then upsized to a 8 mm shockwave balloon and repeated intra-arterial lithotripsy at this level.  I then used the 8 mm balloon to treat the entire length of the external iliac artery.  I then exchanged out the Sparta core wire for a Lunderquist.  Angiography was then performed with a Omni Flush catheter locating the renal arteries.  The AUI device was prepared on the back table and inserted.  This was a Cook 24 x 113 device.  This was deployed landing at the level of the renal arteries and extending into the external iliac artery.  The delivery system was then removed and a Gore dry seal sheath was inserted.  A retrograde injection was then performed through the sheath locating the hypogastric artery.  I extended the AUI using a Gore VBX 11 x 59.  Next a MOV balloon was used to mold the VBX to the AUI as well as molding of the entire length of the AUI device.  Completion imaging was then performed which showed successful exclusion of the aneurysm with inline flow through the iliac artery without stenosis.  The sheath and wires were then removed and the femoral artery was again occluded with vascular clamps.  I used the opening in the patch from the sheath for the femoral-femoral anastomosis.  This was extended with Potts scissors.  The 8 mm dacryon  graft was then beveled to fit the size of the hole and the patch and patch repair was performed with a running 5-0 Prolene.  Prior to completion the appropriate flush maneuvers were performed and the anastomosis was completed.  The graft was then brought through the previously created tunnel into the right groin.  The right femoral artery was then occluded with vascular clamps.  A #11 blade was used to make an arteriotomy which was extended longitudinally with Potts scissors.  There was extensive plaque at the femoral bifurcation necessitating endarterectomy.  The endarterectomy went down onto the superficial femoral artery for about 1 cm.  The plaque was then tacked down with 7-0 Prolene.  Eversion endarterectomy of the profunda was then performed.  The graft was then cut the appropriate length and beveled to fit the size of the arteriotomy.  A running anastomosis was created with 5-0 Prolene.  Prior to completion, the appropriate flushing maneuvers were performed and the anastomosis was completed.  All clamps were released.  There were excellent triphasic signals in the profunda and superficial femoral  arteries bilaterally.  The patient's heparin  was then reversed with 50 mg of protamine .  Hemostasis was achieved.  The incisions were irrigated.  Surgiflo was placed around the anastomoses.  I then reapproximated the femoral sheath bilaterally with running 2-0 Vicryl.  Kerecis powder was then placed into both groins.  The subcutaneous tissue was then reapproximated with 3-0 Vicryl and the skin was closed with running 4-0 Vicryl followed by Dermabond.  The patient was successfully Spaete taken recovery in stable condition.  There were no immediate complications.   Disposition: To PACU stable.   ALONSO Malvina New, M.D., [facility] [place] Office: [phone] Pager:  [phone]

## 2024-07-07 NOTE — Anesthesia Procedure Notes (Signed)
 Arterial Line Insertion Start/End9/07/2024 7:55 AM, 07/07/2024 8:05 AM Performed by: CRNA  Patient location: Pre-op. Preanesthetic checklist: patient identified, IV checked, site marked, risks and benefits discussed, surgical consent, monitors and equipment checked, pre-op evaluation, timeout performed and anesthesia consent Lidocaine  1% used for infiltration radial was placed Catheter size: 20 G Hand hygiene performed  and maximum sterile barriers used   Attempts: 1 Procedure performed without using ultrasound guided technique. Following insertion, dressing applied. Post procedure assessment: normal and unchanged

## 2024-07-08 ENCOUNTER — Other Ambulatory Visit (HOSPITAL_COMMUNITY): Payer: Self-pay

## 2024-07-08 ENCOUNTER — Telehealth (HOSPITAL_COMMUNITY): Payer: Self-pay | Admitting: Pharmacy Technician

## 2024-07-08 DIAGNOSIS — Z95828 Presence of other vascular implants and grafts: Secondary | ICD-10-CM

## 2024-07-08 DIAGNOSIS — I493 Ventricular premature depolarization: Secondary | ICD-10-CM

## 2024-07-08 DIAGNOSIS — I10 Essential (primary) hypertension: Secondary | ICD-10-CM

## 2024-07-08 DIAGNOSIS — I2510 Atherosclerotic heart disease of native coronary artery without angina pectoris: Secondary | ICD-10-CM

## 2024-07-08 DIAGNOSIS — I4891 Unspecified atrial fibrillation: Secondary | ICD-10-CM

## 2024-07-08 DIAGNOSIS — E785 Hyperlipidemia, unspecified: Secondary | ICD-10-CM | POA: Diagnosis not present

## 2024-07-08 LAB — POCT ACTIVATED CLOTTING TIME
Activated Clotting Time: 222 s
Activated Clotting Time: 228 s
Activated Clotting Time: 233 s
Activated Clotting Time: 239 s

## 2024-07-08 LAB — BASIC METABOLIC PANEL WITH GFR
Anion gap: 11 (ref 5–15)
BUN: 20 mg/dL (ref 8–23)
CO2: 22 mmol/L (ref 22–32)
Calcium: 9.2 mg/dL (ref 8.9–10.3)
Chloride: 100 mmol/L (ref 98–111)
Creatinine, Ser: 1.4 mg/dL — ABNORMAL HIGH (ref 0.61–1.24)
GFR, Estimated: 55 mL/min — ABNORMAL LOW (ref >60)
Glucose, Bld: 251 mg/dL — ABNORMAL HIGH (ref 70–99)
Potassium: 5.3 mmol/L — ABNORMAL HIGH (ref 3.5–5.1)
Sodium: 133 mmol/L — ABNORMAL LOW (ref 135–145)

## 2024-07-08 LAB — CBC
HCT: 31.8 % — ABNORMAL LOW (ref 39.0–52.0)
Hemoglobin: 10.6 g/dL — ABNORMAL LOW (ref 13.0–17.0)
MCH: 30.8 pg (ref 26.0–34.0)
MCHC: 33.3 g/dL (ref 30.0–36.0)
MCV: 92.4 fL (ref 80.0–100.0)
Platelets: 149 K/uL — ABNORMAL LOW (ref 150–400)
RBC: 3.44 MIL/uL — ABNORMAL LOW (ref 4.22–5.81)
RDW: 13.6 % (ref 11.5–15.5)
WBC: 9.5 K/uL (ref 4.0–10.5)
nRBC: 0 % (ref 0.0–0.2)

## 2024-07-08 LAB — LIPID PANEL
Cholesterol: 93 mg/dL (ref 0–200)
HDL: 43 mg/dL (ref >40)
LDL Cholesterol: 38 mg/dL (ref 0–99)
Total CHOL/HDL Ratio: 2.2 ratio
Triglycerides: 60 mg/dL (ref <150)
VLDL: 12 mg/dL (ref 0–40)

## 2024-07-08 LAB — HEPARIN LEVEL (UNFRACTIONATED): Heparin Unfractionated: 0.3 [IU]/mL (ref 0.30–0.70)

## 2024-07-08 MED ORDER — AMIODARONE LOAD VIA INFUSION
150.0000 mg | Freq: Once | INTRAVENOUS | Status: AC
  Administered 2024-07-08: 150 mg via INTRAVENOUS
  Filled 2024-07-08: qty 83.34

## 2024-07-08 MED ORDER — PANTOPRAZOLE SODIUM 40 MG PO TBEC
40.0000 mg | DELAYED_RELEASE_TABLET | Freq: Every day | ORAL | Status: DC
  Administered 2024-07-08 – 2024-07-11 (×4): 40 mg via ORAL
  Filled 2024-07-08 (×4): qty 1

## 2024-07-08 MED ORDER — AMIODARONE HCL IN DEXTROSE 360-4.14 MG/200ML-% IV SOLN
30.0000 mg/h | INTRAVENOUS | Status: DC
  Administered 2024-07-08 – 2024-07-09 (×3): 30 mg/h via INTRAVENOUS
  Filled 2024-07-08 (×2): qty 200

## 2024-07-08 MED ORDER — METOCLOPRAMIDE HCL 5 MG/ML IJ SOLN
10.0000 mg | Freq: Once | INTRAMUSCULAR | Status: AC
Start: 2024-07-08 — End: 2024-07-08
  Administered 2024-07-08: 10 mg via INTRAVENOUS
  Filled 2024-07-08: qty 2

## 2024-07-08 MED ORDER — AMIODARONE HCL IN DEXTROSE 360-4.14 MG/200ML-% IV SOLN
60.0000 mg/h | INTRAVENOUS | Status: AC
Start: 2024-07-08 — End: 2024-07-08
  Administered 2024-07-08 (×2): 60 mg/h via INTRAVENOUS
  Filled 2024-07-08 (×2): qty 200

## 2024-07-08 MED ORDER — HEPARIN (PORCINE) 25000 UT/250ML-% IV SOLN
1100.0000 [IU]/h | INTRAVENOUS | Status: DC
  Administered 2024-07-08: 1150 [IU]/h via INTRAVENOUS
  Administered 2024-07-09: 1200 [IU]/h via INTRAVENOUS
  Administered 2024-07-10: 1100 [IU]/h via INTRAVENOUS
  Filled 2024-07-08 (×4): qty 250

## 2024-07-08 NOTE — Progress Notes (Addendum)
 Progress Note    07/08/2024 6:40 AM 1 Day Post-Op  Subjective:  says he is sore this morning.  Foley out about 45 minutes ago.  Has been out of bed but not walked  Afebrile HR 70's-100's  100's-140's systolic 95% RA  Gtts:  none  Vitals:   07/07/24 2319 07/08/24 0300  BP: (!) 118/56 99/62  Pulse: 81 76  Resp: 18 20  Temp: 97.7 F (36.5 C) 98 F (36.7 C)  SpO2: 90% 92%    Physical Exam: General:  no distress Cardiac:  regular Lungs:  non labored Incisions:  bilateral groin incisions are clean and intact with some ecchymosis Extremities:  brisk doppler flow bilateral DP/PT; calves soft bilaterally; palpable fem fem pulse.  Abdomen:  soft  CBC    Component Value Date/Time   WBC 9.5 07/08/2024 0334   RBC 3.44 (L) 07/08/2024 0334   HGB 10.6 (L) 07/08/2024 0334   HGB 14.4 01/19/2024 1607   HCT 31.8 (L) 07/08/2024 0334   HCT 42.7 01/19/2024 1607   PLT 149 (L) 07/08/2024 0334   PLT 187 01/19/2024 1607   MCV 92.4 07/08/2024 0334   MCV 91 01/19/2024 1607   MCH 30.8 07/08/2024 0334   MCHC 33.3 07/08/2024 0334   RDW 13.6 07/08/2024 0334   RDW 13.2 01/19/2024 1607   LYMPHSABS 0.6 (L) 04/01/2021 0145   MONOABS 0.4 04/01/2021 0145   EOSABS 0.0 04/01/2021 0145   BASOSABS 0.0 04/01/2021 0145    BMET    Component Value Date/Time   NA 133 (L) 07/08/2024 0334   NA 140 01/19/2024 1607   K 5.3 (H) 07/08/2024 0334   CL 100 07/08/2024 0334   CO2 22 07/08/2024 0334   GLUCOSE 251 (H) 07/08/2024 0334   BUN 20 07/08/2024 0334   BUN 13 01/19/2024 1607   CREATININE 1.40 (H) 07/08/2024 0334   CREATININE 0.83 03/30/2012 0921   CALCIUM  9.2 07/08/2024 0334   GFRNONAA 55 (L) 07/08/2024 0334   GFRAA  11/02/2010 0645    >60        The eGFR has been calculated using the MDRD equation. This calculation has not been validated in all clinical situations. eGFR's persistently <60 mL/min signify possible Chronic Kidney Disease.    INR    Component Value Date/Time   INR  1.0 07/06/2024 1400     Intake/Output Summary (Last 24 hours) at 07/08/2024 0640 Last data filed at 07/08/2024 9378 Gross per 24 hour  Intake 2398.21 ml  Output 2505 ml  Net -106.79 ml      Assessment/Plan:  68 y.o. male is s/p:  Bilateral open femoral artery exposure, EVAR (unilateral endograft), fem fem bypass and left CIA stent placement 07/07/2024 by Dr. Serene for AAA and RLE claudication.  1 Day Post-Op   -pt with brisk doppler flow bilateral feet -incisions look good-discussed groin wound care and to keep dry gauze in groins while sitting to wick moisture to help prevent wound infection.  Talked with him to clean with soap and water daily and otherwise, keep dry.  -increase in creatinine to 1.4 from 0.98 pre-op and K+ 5.3 this am.  Continue IVF today and will recheck labs tomorrow.  ARB was held yesterday for soft pressure.  Will continue to hold today. -mobilize out of bed.  -DVT prophylaxis:  sq heparin  -plavix  / statin   Lucie Apt, PA-C [facility] [phone] 07/08/2024 6:40 AM  I agree with the above: Acute renal insufficiency:  likely from contrast.  Will hydrate today and repeat creatinine tomorrow AFIB:  post op.  Has a history in the past.  Amio bolus and drip started.  Will ask cardiology to eval Vasc:  currently on plavix .  I am ok with a DOAC and ASA if necessary for his AFIB Excellent pedal doppler signals Groin incisions without hematoma  Wells Jeilyn Reznik

## 2024-07-08 NOTE — Progress Notes (Signed)
 Making NPO for midnight in case we need to plan for TEE/DCCV if patient continues to have elevated rates despite amio.

## 2024-07-08 NOTE — Progress Notes (Signed)
 RN came into patient's room to receive report from [place], RN, and pt was sitting on the edge of the bed. His HR began to increase until it topped out in the upper 180's. Pt has a history of Afib RVR. EKG obtained, full vitals obtained, PRN metoprolol  given. PA, Ryne, notified by [employer], Dorise. PA ordered 150mg  amiodarone  bolus, and then 60mg /hr drip. Will administer and continue to monitor patient.

## 2024-07-08 NOTE — Consult Note (Addendum)
 Cardiology Consultation   Patient ID: Manuel Richardson MRN: [id number]; DOB: 09/16/1956  Admit date: 07/07/2024 Date of Consult: 07/08/2024  PCP:  Valentin Skates, DO   [facility] Providers Cardiologist:  Newman JINNY Lawrence, MD   Patient Profile: Manuel Richardson is a 68 y.o. male with a hx of CAD with CTO of RCA, hypertension, hyperlipidemia, diabetes, atrial fibrillation, PAD, history of left carotid to subclavian bypass, severe left subclavian stenosis, COPD, prior smoker, amaurosis fugax who is being seen 07/08/2024 for the evaluation of A-fib RVR at the request of vascular surgery.  History of Present Illness: Manuel Richardson has history of atrial fibrillation and June 2022 when he was hospitalized for COVID-19.  Plans were for outpatient cardioversion.  He was then lost in follow-up but then saw Dr. Lawrence 04/2023.  Reportedly he had followed up with cardiologist at [facility].  He had another episode of atrial fibrillation during albuterol  evaluation in the setting of COPD exacerbation.  Heart monitor during that time did not show A-fib and he was not in favor starting Eliquis  unless he had recurrences.  Additionally has history of coronary artery disease with complaints of dyspnea that have been felt due to be a combination of underlying CAD and COPD.  He had abnormal stress test 12/2023.  Eventually underwent left heart catheterization 01/2024 with failed PCI to CTO of the RCA, unable to cross with a wire.  Now being medically managed.  Otherwise has extensive vascular disease as stated above.  Currently, patient is status post bilateral open from femoral artery exposure, EVAR . Fem fem bypass and left CIA stent placement performed 09/10.  He is found to be in A-fib RVR this morning around 7:00 with heart rates as high as 190.  Started on IV amiodarone .  Rates 120-100 40s currently.  Postop without any acute complications.  He has had increasing creatinine and mild hyperkalemia 5.3.   Also with anemia hemoglobin has dropped from 15.4-10.6 in 2 days.  Platelets 149.  Started on Plavix  today.  Patient reports he is completely asymptomatic and has not noticed any tachycardia, palpitations, shortness of breath or chest pain.  He has been functionally limited due to underlying claudication symptoms.  But feels well otherwise.  Denies any peripheral edema, orthopnea.  Also states that his COPD is decently controlled and only uses nightly oxygen . He has a long smoking history, reportedly 100-pack-year smoking history, smoking for 50 years.  Quit March 2024.  Potassium 5.3.  Creatinine 1.40.  Hemoglobin 10.6.  Past Medical History:  Diagnosis Date   Anginal pain (HCC)    Carotid artery occlusion    COPD (chronic obstructive pulmonary disease) (HCC)    Coronary artery disease    Dyspnea    with exertion   Erectile dysfunction    Full dentures    GERD (gastroesophageal reflux disease)    Hyperlipidemia    Hypertension    Peripheral vascular disease (HCC)    Stenosis of left subclavian artery (HCC) 11/01/2010   s/p left CCA-SCA bypass 7 mm Dacron   Tobacco use disorder     Past Surgical History:  Procedure Laterality Date   carotid artery surgery  2012   stent in left side   COLONOSCOPY  2007   CORONARY CTO INTERVENTION N/A 01/29/2024   Procedure: CORONARY CTO INTERVENTION;  Surgeon: Swaziland, Peter M, MD;  Location: [facility] INVASIVE CV LAB;  Service: Cardiovascular;  Laterality: N/A;   ELBOW SURGERY  teenager   left   LEFT  HEART CATH AND CORONARY ANGIOGRAPHY N/A 01/16/2024   Procedure: LEFT HEART CATH AND CORONARY ANGIOGRAPHY;  Surgeon: Elmira Newman PARAS, MD;  Location: [facility] INVASIVE CV LAB;  Service: Cardiovascular;  Laterality: N/A;    Scheduled Meds:  amLODipine   5 mg Oral Daily   budesonide -glycopyrrolate -formoterol   2 puff Inhalation BID   clopidogrel   75 mg Oral Daily   docusate sodium   100 mg Oral Daily   ezetimibe   10 mg Oral Daily   heparin   5,000 Units  Subcutaneous Q8H   ranolazine   500 mg Oral BID   rosuvastatin   40 mg Oral Daily   Continuous Infusions:  sodium chloride      sodium chloride  Stopped (07/07/24 1652)   amiodarone  60 mg/hr (07/08/24 0852)   Followed by   amiodarone      PRN Meds: sodium chloride , acetaminophen  **OR** acetaminophen , albuterol , bisacodyl , hydrALAZINE , HYDROmorphone  (DILAUDID ) injection, labetalol , nitroGLYCERIN , ondansetron , oxyCODONE -acetaminophen , phenol, polyethylene glycol, potassium chloride   Allergies:   No Known Allergies  Social History:   Social History   Socioeconomic History   Marital status: Single    Spouse name: Not on file   Number of children: 2   Years of education: Not on file   Highest education level: Not on file  Occupational History   Occupation: Nurse, mental health: [employer]    Comment: HVAC, 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     Smoked 3 PPD at his heaviest  Vaping Use   Vaping status: Never Used  Substance and Sexual Activity   Alcohol use: Not Currently    Comment: occasionally   Drug use: No   Sexual activity: Not Currently  Other Topics Concern   Not on file  Social History Narrative   Married, 1 daughter in [place], exercise - walking on job   Social Drivers of Corporate investment banker Strain: Not on file  Food Insecurity: No Food Insecurity (07/08/2024)   Hunger Vital Sign    Worried About Running Out of Food in the Last Year: Never true    Ran Out of Food in the Last Year: Never true  Transportation Needs: No Transportation Needs (07/08/2024)   PRAPARE - Administrator, Civil Service (Medical): No    Lack of Transportation  (Non-Medical): No  Physical Activity: Not on file  Stress: Not on file  Social Connections: Unknown (07/08/2024)   Social Connection and Isolation Panel    Frequency of Communication with Friends and Family: More than three times a week    Frequency of Social Gatherings with Friends and Family: More than three times a week    Attends Religious Services: 1 to 4 times per year    Active Member of [employer] or Organizations: Yes    Attends Banker Meetings: 1 to 4 times per year    Marital Status: Patient declined  Intimate Partner Violence: Not At Risk (07/08/2024)   Humiliation, Afraid, Rape, and Kick questionnaire    Fear of Current or Ex-Partner: No    Emotionally Abused: No    Physically Abused: No  Sexually Abused: No    Family History:   Family History  Problem Relation Age of Onset   Heart disease Mother        died 74 with MI   Cancer Father        lung cancer   Asthma Father    Lung cancer Father        smoked   Hypertension Brother    Diabetes Neg Hx    Stroke Neg Hx      ROS:  Please see the history of present illness.  All other ROS reviewed and negative.     Physical Exam/Data: Vitals:   07/07/24 1900 07/07/24 2319 07/08/24 0300 07/08/24 1030  BP: 110/87 (!) 118/56 99/62 115/75  Pulse: 88 81 76   Resp: 19 18 20    Temp: 98.1 F (36.7 C) 97.7 F (36.5 C) 98 F (36.7 C)   TempSrc: Oral Oral Oral   SpO2: 97% 90% 92%   Weight:      Height:        Intake/Output Summary (Last 24 hours) at 07/08/2024 1128 Last data filed at 07/08/2024 9378 Gross per 24 hour  Intake 1898.21 ml  Output 2205 ml  Net -306.79 ml      07/07/2024    6:38 AM 07/06/2024    1:03 PM 06/16/2024   11:36 AM  Last 3 Weights  Weight (lbs) 168 lb 168 lb 6.4 oz 169 lb 6.4 oz  Weight (kg) 76.204 kg 76.386 kg 76.839 kg     Body mass index is 25.54 kg/m.  General:  Well nourished, well developed, in no acute distress HEENT: normal Neck: no JVD Vascular: No carotid bruits;  Distal pulses 2+ bilaterally Cardiac: Distant heart sounds.  Irregularly irregular, tachycardic Lungs: Distant lung sounds but overall sound clear Abd: soft, nontender, no hepatomegaly  Ext: no edema Musculoskeletal:  No deformities, BUE and BLE strength normal and equal Skin: warm and dry  Neuro:  CNs 2-12 intact, no focal abnormalities noted Psych:  Normal affect   EKG:  The EKG was personally reviewed and demonstrates: Getting EKG now Telemetry:  Telemetry was personally reviewed and demonstrates: Atrial fibrillation heart rates 120-140.  Relevant CV Studies: Left heart catheterization 01/29/2024 Prox RCA to Mid RCA lesion is 100% stenosed.   Post intervention, there is a 100% residual stenosis.   The radiation dose exceeded thresholds defined in the Patient Radiation Dose Management For Interventional Medical Procedures With Extensive Use of Fluoroscopy policy. Specific follow up instructions will be provided to the patient prior to discharge.   Unsuccessful CTO PCI of the RCA - unable to cross with a wire     Plan: will observe overnight. Can DC Plavix . Recommend medical therap  Echocardiogram 09/2023  1. Left ventricular ejection fraction, by estimation, is 55 to 60%. The  left ventricle has normal function. The left ventricle has no regional  wall motion abnormalities. There is mild left ventricular hypertrophy.  Left ventricular diastolic parameters  are consistent with Grade I diastolic dysfunction (impaired relaxation).   2. Right ventricular systolic function is normal. The right ventricular  size is normal. Tricuspid regurgitation signal is inadequate for assessing  PA pressure.   3. The mitral valve is normal in structure. Trivial mitral valve  regurgitation. No evidence of mitral stenosis.   4. The aortic valve is tricuspid. Aortic valve regurgitation is not  visualized. No aortic stenosis is present.   Laboratory Data: High Sensitivity Troponin:  No results for  input(s): TROPONINIHS  in the last 720 hours.   Chemistry Recent Labs  Lab 07/06/24 1400 07/07/24 1659 07/08/24 0334  NA 134*  --  133*  K 4.6  --  5.3*  CL 101  --  100  CO2 21*  --  22  GLUCOSE 115*  --  251*  BUN 16  --  20  CREATININE 1.25* 1.36* 1.40*  CALCIUM  10.1  --  9.2  GFRNONAA >60 57* 55*  ANIONGAP 12  --  11    Recent Labs  Lab 07/06/24 1400  PROT 7.7  ALBUMIN  4.5  AST 29  ALT 39  ALKPHOS 67  BILITOT 0.6   Lipids  Recent Labs  Lab 07/08/24 0334  CHOL 93  TRIG 60  HDL 43  LDLCALC 38  CHOLHDL 2.2    Hematology Recent Labs  Lab 07/06/24 1400 07/07/24 1659 07/08/24 0334  WBC 7.1 8.0 9.5  RBC 5.07 3.62* 3.44*  HGB 15.4 11.0* 10.6*  HCT 47.3 33.7* 31.8*  MCV 93.3 93.1 92.4  MCH 30.4 30.4 30.8  MCHC 32.6 32.6 33.3  RDW 13.4 13.6 13.6  PLT 212 150 149*   Thyroid No results for input(s): TSH, FREET4 in the last 168 hours.  BNPNo results for input(s): BNP, PROBNP in the last 168 hours.  DDimer No results for input(s): DDIMER in the last 168 hours.  Radiology/Studies:  HYBRID OR IMAGING ([facility] ONLY) Result Date: 07/07/2024 There is no interpretation for this exam.  This order is for images obtained during a surgical procedure.  Please See Surgeries Tab for more information regarding the procedure.     Assessment and Plan:  Recurrent A-fib RVR -History 04/2021 in the setting of COVID-19 -Another episode at [facility] but 2022 monitor with no afib reportedly. Patient is postop day 1 from his vascular procedures.  Triggers likely this as well as mild AKI, potassium 5.3, anemia with a drop in hemoglobin of around 5.  Went into A-fib RVR today heart rates as high as 190, started on IV amiodarone .  Better heart rates but still uncontrolled 120-140.  Fortunately asymptomatic and euvolemic. Will start IV heparin  since he is still periprocedure with drop in hemoglobin.  Will transition to DOAC when safe to do so and with guidance from vascular  surgery. Continue with IV amiodarone , increase infusion rate to 60. Will see how he responds with this but may need DCCV if we cannot control rates.   In the future if he needs beta-blocker may consider bisoprolol given underlying COPD. Check TSH and correct AKI/hyperkalemia and hydrate. Check echocardiogram   CAD Hyperlipidemia Single-vessel disease with CTO of the RCA with failed PCI 01/2024 due to inability to cross the wire.  Denies any anginal complaints, primarily has shortness of breath that likely is mainly driven by COPD. On Plavix , continue Zetia  10 mg, rosuvastatin  40 mg, and Azaline 500 mg twice daily. LDL well-controlled 38 this admission.  Diabetes A1c 6.7% 3 years ago.  Recommend repeating A1c for further restratification.  PAD He is status post bilateral open femoral artery exposure, EVAR, fem ferm bypass and left CIA stent placement 9/10.  Further management per vascular surgery.   Started on Plavix  per vascular surgery.    AKI and hyperkalemia Potassium 5.3.  Continue to hydrate.  Hypertension BP is okay.  If he does not respond well with just IV amiodarone  then we need more adjunct therapy will likely need to stop his amlodipine  to further prioritize rate control.   Risk Assessment/Risk Scores:  CHA2DS2-VASc Score = 4  } This indicates a 4.8% annual risk of stroke. The patient's score is based upon: CHF History: 0 HTN History: 1 Diabetes History: 1 Stroke History: 0 Vascular Disease History: 1 Age Score: 1 Gender Score: 0   For questions or updates, please contact [facility] Please consult [email or web address] for contact info under    Signed, Thom LITTIE Sluder, PA-C  07/08/2024 11:28 AM   Patient seen and examined   I agree with findings as noted above by GORMAN Sluder   Pt is a 68 yo with hx of CAD( LHC in April 2025 with failed PCI to CTO of RCA), atrial fibrillation, PAD, COPD, HTN, HL   Asked to  see re atrial fibrillation P had one documented  episode in June 2022   Had COVID at time   Plan was for outpt cardioversion   He was lost to follow up.   Pt seen  by cardiology in [place]  Reported to have another episode of afib during COPD exacerbation in past    Heart monitor after did not show afib   Ptwas  not started on anticoagulation.  Yesterday pt underwent vascular surgery (open)   In SR until this am when developed afib with RVR  Up to 190    He denies palpitations    Breathing is OK at rest   No dizziness On exam Neck   JVP is normal  No bruits Lungs are CTA Cardiac  Irreg irreg  No S3   No signif murmurs Abd Soft   Ext without LE edema     IMpression Afib with RVR   Pt is tolerating    Would start amiodarone  60 mg IV   for rate control  May convert. He is on heparin  If rates remain high may need DCCV      CHADSVASc score is 4   I would keep long term Eliquis   He does not sense  May have more afib than he knows. He does not sense  2  CAD   Pt with CTO to RCA  Denies CP     \ 3  PAD   Stent to L CIA  On Plavix      Will continue to follow

## 2024-07-08 NOTE — Progress Notes (Signed)
 Pt was given PRN dose of metoprolol  for HR>130 at 1052. HR still in the upper 130's-140's. PA, Thom Sluder, notified to see if anything else needs to be given. Waiting on response.

## 2024-07-08 NOTE — Progress Notes (Signed)
 Patient with complaint of acid reflux and has already received his order of EC protonix , Patient is requesting an order of reglan  which he says provided relief for him when administered last night. Paged PA for non-emergent request.

## 2024-07-08 NOTE — Telephone Encounter (Signed)
 Patient Product/process development scientist completed.    The patient is insured through [employer]/ [employer]. Patient has Medicare and is not eligible for a copay card, but may be able to apply for patient assistance or Medicare RX Payment Plan (Patient Must reach out to their plan, if eligible for payment plan), if available.    Ran test claim for Eliquis 5 mg and the current 30 day co-pay is $47.00.  Ran test claim for Xarelto 20 mg and the current 30 day co-pay is $47.00.  This test claim was processed through [facility]- copay amounts may vary at other pharmacies due to pharmacy/plan contracts, or as the patient moves through the different stages of their insurance plan.     Roland Earl, CPHT Pharmacy Technician III Certified Patient Advocate [facility] Patient Advocate Team Direct Number: [phone]  Fax: [phone]

## 2024-07-08 NOTE — Progress Notes (Signed)
 PHARMACY - ANTICOAGULATION CONSULT NOTE  Pharmacy Consult for heparin  Indication: atrial fibrillation  No Known Allergies  Patient Measurements: Height: 5' 8 (172.7 cm) Weight: 76.2 kg (168 lb) IBW/kg (Calculated) : 68.4 HEPARIN  DW (KG): 76.2  Vital Signs: Temp: 98.1 F (36.7 C) (09/11 1948) Temp Source: Oral (09/11 1948) BP: 123/78 (09/11 1948) Pulse Rate: 99 (09/11 1948)  Labs: Recent Labs    07/06/24 1400 07/07/24 1659 07/08/24 0334 07/08/24 2132  HGB 15.4 11.0* 10.6*  --   HCT 47.3 33.7* 31.8*  --   PLT 212 150 149*  --   APTT 28  --   --   --   LABPROT 13.5  --   --   --   INR 1.0  --   --   --   HEPARINUNFRC  --   --   --  0.30  CREATININE 1.25* 1.36* 1.40*  --     Estimated Creatinine Clearance: 49.5 mL/min (A) (by C-G formula based on SCr of 1.4 mg/dL (H)).   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 8 g 2 07/07/2024 at  4:30 AM   amLODipine  (NORVASC ) 5 MG tablet Take 1 tablet (5 mg total) by mouth daily. 90 tablet 3 07/06/2024   BREZTRI  AEROSPHERE 160-9-4.8 MCG/ACT AERO inhaler Inhale 2 puffs into the lungs 2 (two) times daily. 10.7 g 6 07/07/2024 at  4:30 AM   clopidogrel  (PLAVIX ) 75 MG tablet Take 1 tablet (75 mg total) by mouth daily. 90 tablet 3 07/01/2024    ezetimibe  (ZETIA ) 10 MG tablet Take 1 tablet (10 mg total) by mouth daily. 90 tablet 3 07/06/2024   losartan  (COZAAR ) 50 MG tablet Take 1 tablet (50 mg total) by mouth daily. 90 tablet 3 07/06/2024   metoprolol  succinate (TOPROL  XL) 50 MG 24 hr tablet Take 1 tablet (50 mg total) by mouth daily. Take with or immediately following a meal. 90 tablet 2 07/06/2024 at  5:30 PM   nitroGLYCERIN  (NITROSTAT ) 0.4 MG SL tablet Place 1 tablet (0.4 mg total) under the tongue every 5 (five) minutes as needed for chest pain. 25 tablet 3 Taking As Needed   ranolazine  (RANEXA ) 500 MG 12 hr tablet Take 1 tablet (500 mg total) by mouth 2 (two) times daily. 180 tablet 2 07/06/2024   rosuvastatin  (CRESTOR ) 40 MG tablet Take 1 tablet (40 mg total) by mouth daily. 90 tablet 3 07/06/2024   Scheduled:   amLODipine   5 mg Oral Daily   budesonide -glycopyrrolate -formoterol   2 puff Inhalation BID   clopidogrel   75 mg Oral Daily   docusate sodium   100 mg Oral Daily   ezetimibe   10 mg Oral Daily   pantoprazole   40 mg Oral QHS  ranolazine   500 mg Oral BID   rosuvastatin   40 mg Oral Daily   Infusions:   sodium chloride      amiodarone  30 mg/hr (07/08/24 1944)   heparin  1,150 Units/hr (07/08/24 1324)    Assessment: Pt with a hx of AF and DCCV but not on AC. He is s/p fem-bypass. Heparin  has been ordered for anticoagulation before transitioning to NOAC.  -heparin  level= 0.3 and at the low end of goal on 1150 units/hr -plans noted for possible DCCV  Goal of Therapy:  Heparin  level 0.3-0.7 units/ml Monitor platelets by anticoagulation protocol: Yes   Plan:  -Increase heparin  to 1200 units/hr to keep in goal -Daily heparin  level and CBC  Prentice Poisson, PharmD Clinical Pharmacist **Pharmacist phone directory can now be found on [email or web address] ([email or web address]).  Listed under [facility].

## 2024-07-08 NOTE — Progress Notes (Signed)
 RN messaged PA, Thom Sluder, to update on pt's HR. Still currently in the 140-150 range. No new orders at this time.

## 2024-07-08 NOTE — Evaluation (Signed)
 Occupational Therapy Evaluation & Discharge Patient Details Name: Manuel Richardson MRN: [id number] DOB: November 08, 1955 Today's Date: 07/08/2024   History of Present Illness   Pt is a 68 y.o. male admitted 9/10 for scheduled bil fem endartectomy. PMH: carotid occlusion, COPD, HLD, HTN     Clinical Impressions Pt admitted based on above, and was seen based on problem list below. PTA pt was independent with ADLs and IADLs. Today, pt is at mod I for ADLs. Pt able to complete LB dressing and standing ADLs at mod I level with increased time d/t pain around incision site. Pt completing functional transfers at S for safety with no AD, d/t increased HR. Pt required seated rest break during session d/t HR, max HR 161 bpm. After rest break, pt able to mobilize to recliner, HR while resting in low 130s. Educated pt on pursed lip breathing and energy conservation strategies for ADLs. No follow up OT or DME needs. All education complete, no further acute OT needs identified. OT is signing off on this pt.       If plan is discharge home, recommend the following:   Assistance with cooking/housework;Help with stairs or ramp for entrance     Functional Status Assessment   Patient has not had a recent decline in their functional status     Equipment Recommendations   None recommended by OT      Precautions/Restrictions   Precautions Precautions: Fall Recall of Precautions/Restrictions: Intact Restrictions Weight Bearing Restrictions Per Provider Order: No     Mobility Bed Mobility Overal bed mobility: Modified Independent     General bed mobility comments: Increased time d/t pain    Transfers Overall transfer level: Needs assistance Equipment used: None Transfers: Sit to/from Stand Sit to Stand: Supervision           General transfer comment: S for safety, no AD, no LOB. Increased time d/t pain      Balance Overall balance assessment: Mild deficits observed, not formally  tested       ADL either performed or assessed with clinical judgement   ADL Overall ADL's : Modified independent     General ADL Comments: Pt at mod I for ADLs able to complete LB dressing, standing ADLs, and functional transfers all at mod I. Increased time d/t pain. Required seated rest break d/t HR up to 161 during session     Vision Baseline Vision/History: 0 No visual deficits Patient Visual Report: No change from baseline Vision Assessment?: No apparent visual deficits            Pertinent Vitals/Pain Pain Assessment Pain Assessment: Faces Faces Pain Scale: Hurts a little bit Pain Location: Incision sites Pain Descriptors / Indicators: Grimacing, Aching Pain Intervention(s): Monitored during session     Extremity/Trunk Assessment Upper Extremity Assessment Upper Extremity Assessment: Overall WFL for tasks assessed   Lower Extremity Assessment Lower Extremity Assessment: Defer to PT evaluation   Cervical / Trunk Assessment Cervical / Trunk Assessment: Normal   Communication Communication Communication: No apparent difficulties   Cognition Arousal: Alert Behavior During Therapy: WFL for tasks assessed/performed Cognition: No apparent impairments   Following commands: Intact       Cueing  General Comments   Cueing Techniques: Verbal cues  HR elevated throughout session. Max HR 161 bpm, with seated rest break, returned to recliner, left resting with HR in low 130s. O2 & BP stable on RA           Home Living Family/patient expects to be  discharged to:: Private residence Living Arrangements: Non-relatives/Friends Available Help at Discharge: Family;Available PRN/intermittently Type of Home: Mobile home Home Access: Stairs to enter Entrance Stairs-Number of Steps: 4 Entrance Stairs-Rails: Right;Left Home Layout: One level     Bathroom Shower/Tub: Chief Strategy Officer: Standard Bathroom Accessibility: Yes How Accessible: Accessible  via walker Home Equipment: None          Prior Functioning/Environment Prior Level of Function : Independent/Modified Independent       Mobility Comments: Reports no AD, denies any falls ADLs Comments: Ind    OT Problem List: Decreased activity tolerance;Cardiopulmonary status limiting activity        OT Goals(Current goals can be found in the care plan section)   Acute Rehab OT Goals Patient Stated Goal: To go home OT Goal Formulation: All assessment and education complete, DC therapy Time For Goal Achievement: 07/22/24 Potential to Achieve Goals: Good   AM-PAC OT 6 Clicks Daily Activity     Outcome Measure Help from another person eating meals?: None Help from another person taking care of personal grooming?: None Help from another person toileting, which includes using toliet, bedpan, or urinal?: None Help from another person bathing (including washing, rinsing, drying)?: None Help from another person to put on and taking off regular upper body clothing?: None Help from another person to put on and taking off regular lower body clothing?: None 6 Click Score: 24   End of Session Nurse Communication: Mobility status  Activity Tolerance: Patient tolerated treatment well Patient left: in chair;with call bell/phone within reach  OT Visit Diagnosis: Other abnormalities of gait and mobility (R26.89);Unsteadiness on feet (R26.81)                Time: 9051-8990 OT Time Calculation (min): 21 min Charges:  OT General Charges $OT Visit: 1 Visit OT Evaluation $OT Eval Moderate Complexity: 1 Mod  Adrianne BROCKS, OT  Acute Rehabilitation Services Office [phone] Secure chat preferred   Adrianne GORMAN Savers 07/08/2024, 10:25 AM

## 2024-07-08 NOTE — Progress Notes (Signed)
 PHARMACIST LIPID MONITORING   Manuel Richardson is a 68 y.o. male admitted on 07/07/2024 with PAD.  Pharmacy has been consulted to optimize lipid-lowering therapy with the indication of secondary prevention for clinical ASCVD.  Recent Labs:  Lipid Panel (last 6 months):   Lab Results  Component Value Date   CHOL 93 07/08/2024   TRIG 60 07/08/2024   HDL 43 07/08/2024   CHOLHDL 2.2 07/08/2024   VLDL 12 07/08/2024   LDLCALC 38 07/08/2024    Hepatic function panel (last 6 months):   Lab Results  Component Value Date   AST 29 07/06/2024   ALT 39 07/06/2024   ALKPHOS 67 07/06/2024   BILITOT 0.6 07/06/2024    SCr (since admission):   Serum creatinine: 1.4 mg/dL (H) 90/88/74 9665 Estimated creatinine clearance: 49.5 mL/min (A)  Current therapy and lipid therapy tolerance Current lipid-lowering therapy: Ezetimibe /rosuvastatin  40mg  Previous lipid-lowering therapies (if applicable):  Documented or reported allergies or intolerances to lipid-lowering therapies (if applicable):   Assessment:   Patient prefers no changes in lipid-lowering therapy at this time due to LDL already below goal  Plan:    1.Statin intensity (high intensity recommended for all patients regardless of the LDL):  No statin changes. The patient is already on a high intensity statin.  2.Add ezetimibe  (if any one of the following):   Not indicated at this time.  3.Refer to lipid clinic:   No  4.Follow-up with:  Primary care provider - Valentin Skates, DO  5.Follow-up labs after discharge:  No changes in lipid therapy, repeat a lipid panel in one year.       Sergio Batch, PharmD, BCIDP, AAHIVP, CPP Infectious Disease Pharmacist 07/08/2024 10:13 AM

## 2024-07-08 NOTE — Progress Notes (Signed)
 PHARMACY - ANTICOAGULATION CONSULT NOTE  Pharmacy Consult for heparin  Indication: atrial fibrillation  No Known Allergies  Patient Measurements: Height: 5' 8 (172.7 cm) Weight: 76.2 kg (168 lb) IBW/kg (Calculated) : 68.4 HEPARIN  DW (KG): 76.2  Vital Signs: Temp: 98 F (36.7 C) (09/11 0300) Temp Source: Oral (09/11 0300) BP: 115/75 (09/11 1030) Pulse Rate: 76 (09/11 0300)  Labs: Recent Labs    07/06/24 1400 07/07/24 1659 07/08/24 0334  HGB 15.4 11.0* 10.6*  HCT 47.3 33.7* 31.8*  PLT 212 150 149*  APTT 28  --   --   LABPROT 13.5  --   --   INR 1.0  --   --   CREATININE 1.25* 1.36* 1.40*    Estimated Creatinine Clearance: 49.5 mL/min (A) (by C-G formula based on SCr of 1.4 mg/dL (H)).   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 8 g 2 07/07/2024 at  4:30 AM   amLODipine  (NORVASC ) 5 MG tablet Take 1 tablet (5 mg total) by mouth daily. 90 tablet 3 07/06/2024   BREZTRI  AEROSPHERE 160-9-4.8 MCG/ACT AERO inhaler Inhale 2 puffs into the lungs 2 (two) times daily. 10.7 g 6 07/07/2024 at  4:30 AM   clopidogrel  (PLAVIX ) 75 MG tablet Take 1 tablet (75 mg total) by mouth daily. 90 tablet 3 07/01/2024   ezetimibe  (ZETIA ) 10 MG tablet Take 1 tablet (10 mg total) by mouth daily. 90 tablet 3  07/06/2024   losartan  (COZAAR ) 50 MG tablet Take 1 tablet (50 mg total) by mouth daily. 90 tablet 3 07/06/2024   metoprolol  succinate (TOPROL  XL) 50 MG 24 hr tablet Take 1 tablet (50 mg total) by mouth daily. Take with or immediately following a meal. 90 tablet 2 07/06/2024 at  5:30 PM   nitroGLYCERIN  (NITROSTAT ) 0.4 MG SL tablet Place 1 tablet (0.4 mg total) under the tongue every 5 (five) minutes as needed for chest pain. 25 tablet 3 Taking As Needed   ranolazine  (RANEXA ) 500 MG 12 hr tablet Take 1 tablet (500 mg total) by mouth 2 (two) times daily. 180 tablet 2 07/06/2024   rosuvastatin  (CRESTOR ) 40 MG tablet Take 1 tablet (40 mg total) by mouth daily. 90 tablet 3 07/06/2024   Scheduled:   amLODipine   5 mg Oral Daily   budesonide -glycopyrrolate -formoterol   2 puff Inhalation BID   clopidogrel   75 mg Oral Daily   docusate sodium   100 mg Oral Daily   ezetimibe   10 mg Oral Daily   heparin   5,000 Units Subcutaneous Q8H   ranolazine   500 mg Oral BID   rosuvastatin   40 mg Oral Daily   Infusions:   sodium chloride      sodium chloride  Stopped (07/07/24 1652)   amiodarone   60 mg/hr (07/08/24 9147)   Followed by   amiodarone       Assessment: Pt with a hx of AF and DCCV but not on AC. He is s/p fem-bypass. Heparin  has been ordered for anticoagulation before transitioning to NOAC.  Hgb 10-11, plt ~150k  Goal of Therapy:  Heparin  level 0.3-0.7 units/ml Monitor platelets by anticoagulation protocol: Yes   Plan:  Heparin  1150 units/hr Check 8 hr HL Daily HL and CBC  Sergio Batch, PharmD, BCIDP, AAHIVP, CPP Infectious Disease Pharmacist 07/08/2024 11:51 AM

## 2024-07-08 NOTE — Progress Notes (Signed)
 Pt's IV blew when giving PRN dose of metoprolol  for high HR. IV removed, and STAT IV consult put in. Will stop amiodarone  bolus until new IV access is established.

## 2024-07-08 NOTE — Evaluation (Signed)
 Physical Therapy Evaluation Patient Details Name: Manuel Richardson MRN: [id number] DOB: 1956-06-10 Today's Date: 07/08/2024  History of Present Illness  Pt is a 68 y.o. male admitted 9/10 for scheduled bil fem endartectomy. S/p Bilateral open femoral artery exposure, EVAR, fem-fem bypass and left CIA stent placement 07/07/2024 by Dr. Serene for AAA and RLE claudication. PMH: carotid occlusion, COPD, HLD, HTN   Clinical Impression  Patient is s/p above surgery presenting with functional limitations due to the deficits listed below (see PT Problem List). Comprehensive evaluation limited due to rapid increase in HR with minimal activity. (Resting HR 120s-130s) Increased to 140s upon standing and 150s with short distance gait. Denies chest pain but he is dyspneic. SpO2 mid to upper 90s on RA throughout session. Educated on symptom awareness, LE exercises for circulation, ROM, and strengthening. Anticipate great progression towards functional goals once HR improves. Will follow and progress as tolerated. Patient will benefit from acute skilled PT to increase their independence and safety with mobility to facilitate discharge.         If plan is discharge home, recommend the following: A little help with bathing/dressing/bathroom;Assistance with cooking/housework;Assist for transportation;Help with stairs or ramp for entrance   Can travel by private vehicle        Equipment Recommendations None recommended by PT  Recommendations for Other Services       Functional Status Assessment Patient has had a recent decline in their functional status and demonstrates the ability to make significant improvements in function in a reasonable and predictable amount of time.     Precautions / Restrictions Precautions Precautions: Fall Recall of Precautions/Restrictions: Intact Restrictions Weight Bearing Restrictions Per Provider Order: No      Mobility  Bed Mobility               General bed  mobility comments: in recliner    Transfers Overall transfer level: Needs assistance Equipment used: None Transfers: Sit to/from Stand Sit to Stand: Supervision           General transfer comment: Supervision for safety, slightly guarded but eventually able to fullly extend upright. Painful with descent, cues for technique, well controlled.    Ambulation/Gait Ambulation/Gait assistance: Contact guard assist Gait Distance (Feet): 30 Feet Assistive device: None Gait Pattern/deviations: Step-through pattern, Decreased stride length, Trunk flexed, Antalgic Gait velocity: dec Gait velocity interpretation: <1.8 ft/sec, indicate of risk for recurrent falls   General Gait Details: Grossly stable with mild deviations from straight path, guarded and slower pace. No overt LOB or buckling, but antalgic pattern present. HR to 150 at max - limiting further progression today. Moderate dyspnea with SpO2 mid-upper 90s on RA.  Stairs            Wheelchair Mobility     Tilt Bed    Modified Rankin (Stroke Patients Only)       Balance Overall balance assessment: Mild deficits observed, not formally tested                                           Pertinent Vitals/Pain Pain Assessment Pain Assessment: Faces Faces Pain Scale: Hurts little more Pain Location: Incision sites Pain Descriptors / Indicators: Aching, Operative site guarding Pain Intervention(s): Monitored during session, Repositioned, Limited activity within patient's tolerance    Home Living Family/patient expects to be discharged to:: Private residence Living Arrangements: Non-relatives/Friends Available Help at Discharge:  Family;Available PRN/intermittently Type of Home: Mobile home Home Access: Stairs to enter Entrance Stairs-Rails: Doctor, general practice of Steps: 4   Home Layout: One level Home Equipment: None      Prior Function Prior Level of Function : Independent/Modified  Independent             Mobility Comments: Reports no AD, denies any falls ADLs Comments: Ind     Extremity/Trunk Assessment   Upper Extremity Assessment Upper Extremity Assessment: Defer to OT evaluation    Lower Extremity Assessment Lower Extremity Assessment: Generalized weakness (Mostly pain limited guarding at this time, as expected post-op)    Cervical / Trunk Assessment Cervical / Trunk Assessment: Normal  Communication   Communication Communication: No apparent difficulties    Cognition Arousal: Alert Behavior During Therapy: WFL for tasks assessed/performed   PT - Cognitive impairments: No apparent impairments                         Following commands: Intact       Cueing Cueing Techniques: Verbal cues     General Comments General comments (skin integrity, edema, etc.): HR 120s (up to 130s) at rest, 140s in standing, 150s with gait (short distance.    Exercises General Exercises - Lower Extremity Ankle Circles/Pumps: AROM, Both, 10 reps, Seated Gluteal Sets: Strengthening, Both, 10 reps, Seated Long Arc Quad: Strengthening, Both, 10 reps, Seated   Assessment/Plan    PT Assessment Patient needs continued PT services  PT Problem List Decreased strength;Decreased range of motion;Decreased balance;Decreased activity tolerance;Decreased mobility;Decreased knowledge of use of DME;Cardiopulmonary status limiting activity;Pain       PT Treatment Interventions DME instruction;Gait training;Functional mobility training;Stair training;Therapeutic activities;Therapeutic exercise;Balance training;Neuromuscular re-education;Patient/family education;Modalities    PT Goals (Current goals can be found in the Care Plan section)  Acute Rehab PT Goals Patient Stated Goal: Get well, reduce pain, return home PT Goal Formulation: With patient Time For Goal Achievement: 07/22/24 Potential to Achieve Goals: Good    Frequency Min 2X/week      Co-evaluation               AM-PAC PT 6 Clicks Mobility  Outcome Measure Help needed turning from your back to your side while in a flat bed without using bedrails?: None Help needed moving from lying on your back to sitting on the side of a flat bed without using bedrails?: A Little Help needed moving to and from a bed to a chair (including a wheelchair)?: A Little Help needed standing up from a chair using your arms (e.g., wheelchair or bedside chair)?: A Little Help needed to walk in hospital room?: A Little Help needed climbing 3-5 steps with a railing? : A Little 6 Click Score: 19    End of Session   Activity Tolerance: Treatment limited secondary to medical complications (Comment) (Tachycardia with minimal exertion.) Patient left: in chair;with call bell/phone within reach;with family/visitor present   PT Visit Diagnosis: Unsteadiness on feet (R26.81);Other abnormalities of gait and mobility (R26.89);Muscle weakness (generalized) (M62.81);Difficulty in walking, not elsewhere classified (R26.2);Pain Pain - part of body:  (bil groin)    Time: 8489-8480 PT Time Calculation (min) (ACUTE ONLY): 9 min   Charges:   PT Evaluation $PT Eval Low Complexity: 1 Low   PT General Charges $$ ACUTE PT VISIT: 1 Visit         Leontine Roads, PT, DPT [facility]  Rehabilitation Services Physical Therapist Office: [phone] Website: [email or web address]   [employer]  GORMAN Roads 07/08/2024, 4:30 PM

## 2024-07-08 NOTE — Progress Notes (Signed)
 PT Cancellation Note  Patient Details Name: Manuel Richardson MRN: [id number] DOB: 19-Oct-1956   Cancelled Treatment:    Reason Eval/Treat Not Completed: Other (comment)  Team in room, spoke with RN. Will check back later for PT evaluation.  Leontine Roads, PT, DPT [facility]  Rehabilitation Services Physical Therapist Office: [phone] Website: [email or web address]  Leontine GORMAN Roads 07/08/2024, 8:09 AM

## 2024-07-09 ENCOUNTER — Inpatient Hospital Stay (HOSPITAL_COMMUNITY)

## 2024-07-09 ENCOUNTER — Encounter (HOSPITAL_COMMUNITY): Payer: Self-pay | Admitting: Surgery

## 2024-07-09 DIAGNOSIS — I4891 Unspecified atrial fibrillation: Secondary | ICD-10-CM | POA: Diagnosis not present

## 2024-07-09 DIAGNOSIS — I480 Paroxysmal atrial fibrillation: Secondary | ICD-10-CM

## 2024-07-09 DIAGNOSIS — I2510 Atherosclerotic heart disease of native coronary artery without angina pectoris: Secondary | ICD-10-CM | POA: Diagnosis not present

## 2024-07-09 DIAGNOSIS — I739 Peripheral vascular disease, unspecified: Secondary | ICD-10-CM

## 2024-07-09 LAB — ECHOCARDIOGRAM COMPLETE
Area-P 1/2: 3.93 cm2
Calc EF: 64.2 %
Height: 68 in
S' Lateral: 4.1 cm
Single Plane A2C EF: 62.9 %
Single Plane A4C EF: 64.4 %
Weight: 2688 [oz_av]

## 2024-07-09 LAB — BASIC METABOLIC PANEL WITH GFR
Anion gap: 11 (ref 5–15)
BUN: 19 mg/dL (ref 8–23)
CO2: 24 mmol/L (ref 22–32)
Calcium: 9 mg/dL (ref 8.9–10.3)
Chloride: 101 mmol/L (ref 98–111)
Creatinine, Ser: 1.18 mg/dL (ref 0.61–1.24)
GFR, Estimated: >60 mL/min (ref >60)
Glucose, Bld: 141 mg/dL — ABNORMAL HIGH (ref 70–99)
Potassium: 4.2 mmol/L (ref 3.5–5.1)
Sodium: 136 mmol/L (ref 135–145)

## 2024-07-09 LAB — CBC
HCT: 31 % — ABNORMAL LOW (ref 39.0–52.0)
Hemoglobin: 10.5 g/dL — ABNORMAL LOW (ref 13.0–17.0)
MCH: 30.7 pg (ref 26.0–34.0)
MCHC: 33.9 g/dL (ref 30.0–36.0)
MCV: 90.6 fL (ref 80.0–100.0)
Platelets: 152 K/uL (ref 150–400)
RBC: 3.42 MIL/uL — ABNORMAL LOW (ref 4.22–5.81)
RDW: 13.8 % (ref 11.5–15.5)
WBC: 10.4 K/uL (ref 4.0–10.5)
nRBC: 0 % (ref 0.0–0.2)

## 2024-07-09 LAB — HEMOGLOBIN A1C
Hgb A1c MFr Bld: 6.3 % — ABNORMAL HIGH (ref 4.8–5.6)
Mean Plasma Glucose: 134.11 mg/dL

## 2024-07-09 LAB — TSH: TSH: 9.894 u[IU]/mL — ABNORMAL HIGH (ref 0.350–4.500)

## 2024-07-09 LAB — GLUCOSE, CAPILLARY: Glucose-Capillary: 117 mg/dL — ABNORMAL HIGH (ref 70–99)

## 2024-07-09 LAB — POCT ACTIVATED CLOTTING TIME: Activated Clotting Time: 279 s

## 2024-07-09 LAB — HEPARIN LEVEL (UNFRACTIONATED): Heparin Unfractionated: 0.42 [IU]/mL (ref 0.30–0.70)

## 2024-07-09 MED ORDER — METOPROLOL SUCCINATE ER 25 MG PO TB24
25.0000 mg | ORAL_TABLET | Freq: Every day | ORAL | Status: DC
  Administered 2024-07-09 – 2024-07-12 (×4): 25 mg via ORAL
  Filled 2024-07-09 (×4): qty 1

## 2024-07-09 MED ORDER — AMIODARONE HCL 200 MG PO TABS
200.0000 mg | ORAL_TABLET | Freq: Two times a day (BID) | ORAL | Status: DC
  Administered 2024-07-09 (×2): 200 mg via ORAL
  Filled 2024-07-09 (×2): qty 1

## 2024-07-09 MED ORDER — AMIODARONE HCL 200 MG PO TABS: 200.0000 mg | ORAL_TABLET | Freq: Every day | ORAL | Status: DC

## 2024-07-09 MED ORDER — CALCIUM CARBONATE ANTACID 500 MG PO CHEW
1.0000 | CHEWABLE_TABLET | Freq: Three times a day (TID) | ORAL | Status: DC | PRN
  Administered 2024-07-09: 200 mg via ORAL
  Filled 2024-07-09 (×2): qty 1

## 2024-07-09 MED ORDER — PERFLUTREN LIPID MICROSPHERE
1.0000 mL | INTRAVENOUS | Status: AC | PRN
  Administered 2024-07-09: 2 mL via INTRAVENOUS

## 2024-07-09 MED ORDER — FUROSEMIDE 20 MG PO TABS
10.0000 mg | ORAL_TABLET | Freq: Every day | ORAL | Status: DC
  Administered 2024-07-09 – 2024-07-12 (×4): 10 mg via ORAL
  Filled 2024-07-09 (×4): qty 1

## 2024-07-09 NOTE — Progress Notes (Addendum)
 Progress Note    07/09/2024 6:33 AM 2 Days Post-Op  Subjective:  wants something for heartburn.  Otherwise, no complaints. Says he can tell a difference in his feet.   Afebrile HR 70's-130's afib>NSR 120's-150's systolic 86-97% [place]  Gtts:  amio  Vitals:   07/09/24 0117 07/09/24 0317  BP:  (!) 157/80  Pulse: 80 92  Resp: 14 20  Temp:  98 F (36.7 C)  SpO2: 96% 95%    Physical Exam: General:  no distress Cardiac:  regular Lungs:  non labored Incisions:  bilateral groin incisions are clean and dry Extremities:  brisk doppler flow bilateral PT; palpable pulse in fem fem Abdomen:  soft  CBC    Component Value Date/Time   WBC 10.4 07/09/2024 0315   RBC 3.42 (L) 07/09/2024 0315   HGB 10.5 (L) 07/09/2024 0315   HGB 14.4 01/19/2024 1607   HCT 31.0 (L) 07/09/2024 0315   HCT 42.7 01/19/2024 1607   PLT 152 07/09/2024 0315   PLT 187 01/19/2024 1607   MCV 90.6 07/09/2024 0315   MCV 91 01/19/2024 1607   MCH 30.7 07/09/2024 0315   MCHC 33.9 07/09/2024 0315   RDW 13.8 07/09/2024 0315   RDW 13.2 01/19/2024 1607   LYMPHSABS 0.6 (L) 04/01/2021 0145   MONOABS 0.4 04/01/2021 0145   EOSABS 0.0 04/01/2021 0145   BASOSABS 0.0 04/01/2021 0145    BMET    Component Value Date/Time   NA 136 07/09/2024 0315   NA 140 01/19/2024 1607   K 4.2 07/09/2024 0315   CL 101 07/09/2024 0315   CO2 24 07/09/2024 0315   GLUCOSE 141 (H) 07/09/2024 0315   BUN 19 07/09/2024 0315   BUN 13 01/19/2024 1607   CREATININE 1.18 07/09/2024 0315   CREATININE 0.83 03/30/2012 0921   CALCIUM  9.0 07/09/2024 0315   GFRNONAA >60 07/09/2024 0315   GFRAA  11/02/2010 0645    >60        The eGFR has been calculated using the MDRD equation. This calculation has not been validated in all clinical situations. eGFR's persistently <60 mL/min signify possible Chronic Kidney Disease.    INR    Component Value Date/Time   INR 1.0 07/06/2024 1400     Intake/Output Summary (Last 24 hours) at  07/09/2024 9366 Last data filed at 07/08/2024 1921 Gross per 24 hour  Intake 244.08 ml  Output 1200 ml  Net -955.92 ml      Assessment/Plan:  68 y.o. male is s/p:  Bilateral open femoral artery exposure, EVAR (unilateral endograft), fem fem bypass and left CIA stent placement 07/07/2024 by Dr. Serene for AAA and RLE claudication.   2 Days Post-Op   -pt with brisk doppler flow bilateral PT and incisions look good.   -afib with RVR yesterday-converted back to NSR.  Appreciate cardiology assistance.  Pt on Toprol  and ARB PTA but was held post op for soft BP. -creatinine improved with IVF down to 1.18.  K+ improved to 4.2 -DVT prophylaxis:  heparin  gtt -he knows to keep incisions dry unless he is showering. -currently on plavix /statin -takes tums at home for indigestion.  I put in order for Tums.    Lucie Apt, PA-C [facility] [phone] 07/09/2024 6:33 AM  VASCULAR STAFF ADDENDUM: I have independently interviewed and examined the patient. I agree with the above.  States breathing feels a little worse than normal.  Continues on 2 L-this is baseline Incisions look great Signals in the feet Normal sinus rhythm  on exam. Recommend aggressive pulmonary toilet Medications restarted Will give light Lasix  this afternoon   Fonda FORBES Rim MD [facility] Phone Number: [phone] 07/09/2024 3:14 PM

## 2024-07-09 NOTE — Progress Notes (Signed)
 Mobility Specialist Progress Note:    07/09/24 1142  Mobility  Activity Ambulated with assistance  Level of Assistance Standby assist, set-up cues, supervision of patient - no hands on  Assistive Device Front wheel walker  Distance Ambulated (ft) 220 ft  Activity Response Tolerated well  Mobility Referral Yes  Mobility visit 1 Mobility  Mobility Specialist Start Time (ACUTE ONLY) 1130  Mobility Specialist Stop Time (ACUTE ONLY) 1142  Mobility Specialist Time Calculation (min) (ACUTE ONLY) 12 min   Pt received dangling EOB, agreeable to mobility session d/t constipation. Pt ambulated with RW for support. SV by MS throughout session. Tolerated well, audible SOB, one standing rest break d/t fatigue. SpO2 91% on RA. Returned pt to room, sitting up on EOB. All needs met.    Taniaya Rudder Mobility Specialist Please contact via Special educational needs teacher or  Rehab office at [phone]

## 2024-07-09 NOTE — Progress Notes (Addendum)
 Patient Name: DEMARKIS GHEEN Date of Encounter: 07/09/2024 [facility] Cardiologist: Newman JINNY Lawrence, MD   Interval Summary  .    Converted to sinus rhythm on 07/08/2024 No complaints this morning  Vital Signs .    Vitals:   07/08/24 2334 07/09/24 0117 07/09/24 0317 07/09/24 0904  BP: 132/62  (!) 157/80 137/71  Pulse: 93 80 92 81  Resp: 20 14 20 20   Temp: 98.5 F (36.9 C)  98 F (36.7 C) 98.1 F (36.7 C)  TempSrc: Oral  Oral Oral  SpO2: 96% 96% 95% 96%  Weight:      Height:        Intake/Output Summary (Last 24 hours) at 07/09/2024 1001 Last data filed at 07/09/2024 0904 Gross per 24 hour  Intake 244.08 ml  Output 1450 ml  Net -1205.92 ml      07/07/2024    6:38 AM 07/06/2024    1:03 PM 06/16/2024   11:36 AM  Last 3 Weights  Weight (lbs) 168 lb 168 lb 6.4 oz 169 lb 6.4 oz  Weight (kg) 76.204 kg 76.386 kg 76.839 kg      Telemetry/ECG    07/09/2024- Personally Reviewed Maintaining sinus rhythm since 06/28/2024   Physical Exam .   Physical Exam Vitals and nursing note reviewed.  Constitutional:      General: He is not in acute distress. Neck:     Vascular: No JVD.  Cardiovascular:     Rate and Rhythm: Normal rate and regular rhythm.     Heart sounds: Normal heart sounds. No murmur heard. Pulmonary:     Effort: Pulmonary effort is normal.     Breath sounds: Normal breath sounds. No wheezing or rales.  Abdominal:     General: There is distension.  Musculoskeletal:     Right lower leg: No edema.     Left lower leg: No edema.      Assessment & Plan .     68 y.o. male with hypertension, hyperlipidemia, prediabetes, paroxysmal A-fib,  CAD (RCA CTO), PAD, h/o left carotid to subclavian bypass, s/p EVAR, b/l femoral endarterectomy and fem-fem bypass, left iliac artery IVL and stenting, COPD, nicotine  dependence  Paroxysmal A-fib: Remote history of A-fib, patient was noted to be on anticoagulation in the past.  Episode of A-fib with RVR for  about 12 hours postop, converted to sinus rhythm on 07/08/2024 evening on IV amiodarone . Maintaining sinus rhythm at this time. Switch to p.o. amiodarone  today, starting with 200 mg twice daily, transition to 200 mg daily after 1 week. Resume home medication metoprolol  succinate, at low-dose of 25 mg daily. Currently on IV heparin .  Given prior history of A-fib, and recurrence of A-fib with this hospitalization, I would recommend anticoagulation going forward.  Recommend Eliquis  5 mg twice daily and okay to start aspirin  vascular surgery.  PAD: S/p EVAR, bilateral femoral endarterectomy and femorofemoral bypass, as well as left iliac artery stenting on 07/07/2024. Recommend Plavix .  If aspirin  desired by vascular surgery , along with Plavix  and Eliquis , we could certainly use it for up to 4 weeks, and then continue Plavix  and Eliquis  thereafter.  CAD: Known RCA CTO, unsuccessful revascularization. Continue medical therapy. Continue amlodipine  5 mg daily, Ranexa  5 mg daily, Crestor  40 mg daily, Zetia  10 mg daily.  Abdominal distension: Has not had a BM, question ascites. Defer to primary team.  For questions or updates, please contact [facility] Please consult [email or web address] for contact info under  Signed, Newman JINNY Lawrence, MD

## 2024-07-09 NOTE — Plan of Care (Signed)

## 2024-07-09 NOTE — Progress Notes (Signed)
 Dr. Patwardan is ok to transition amio drip to 200mg  BID x7 days then 200mg  qday. Resume home toprol  at half dose (25mg ) qday. Messaging [facility] to see if we can transition his heparin  to apixaban .   Sergio Batch, PharmD, BCIDP, AAHIVP, CPP Infectious Disease Pharmacist 07/09/2024 11:15 AM

## 2024-07-09 NOTE — Progress Notes (Signed)
 Physical Therapy Treatment Patient Details Name: Manuel Richardson MRN: [id number] DOB: [birth year] Today's Date: 07/09/2024   History of Present Illness Pt is a 68 y.o. male admitted 9/10 for scheduled bil fem endartectomy. S/p Bilateral open femoral artery exposure, EVAR, fem-fem bypass and left CIA stent placement 07/07/2024 by Dr. Serene for AAA and RLE claudication. PMH: carotid occlusion, COPD, HLD, HTN    PT Comments  Pt is progressing well towards goals. Pt with balance deficits that requires RW for safety at home as well as to assist with pulmonary status. Pt currently CGA to Supervision for sit to stand and gait. Pt was able to perform stairs per home set up at [facility] for safety with use of rails. Due to pt current functional status, home set up and available assistance at home no recommended skilled physical therapy services at this time on discharge from acute care hospital setting. Will continue to follow in acute setting in order to ensure that pt returns home with decreased risk for falls, injury, re-hospitalization and improved activity tolerance.      If plan is discharge home, recommend the following: A little help with bathing/dressing/bathroom;Assistance with cooking/housework;Assist for transportation;Help with stairs or ramp for entrance     Equipment Recommendations  Rolling walker (2 wheels)       Precautions / Restrictions Precautions Precautions: Fall Recall of Precautions/Restrictions: Intact Restrictions Weight Bearing Restrictions Per Provider Order: No     Mobility  Bed Mobility   General bed mobility comments: in recliner on arrival and departure.    Transfers Overall transfer level: Needs assistance Equipment used: None, Rolling walker (2 wheels) Transfers: Sit to/from Stand Sit to Stand: Supervision, Contact guard assist           General transfer comment: pt initially stood up without UE support then lost balance posteriorly and sat backdown in  recliner despite CGA to prevent posterior LOB. Pt second attempt at standing provided with RW with verbal cues for safe hand placement.    Ambulation/Gait Ambulation/Gait assistance: Contact guard assist Gait Distance (Feet): 200 Feet Assistive device: Rolling walker (2 wheels) Gait Pattern/deviations: Step-through pattern, Decreased stride length Gait velocity: decreased Gait velocity interpretation: <1.8 ft/sec, indicate of risk for recurrent falls   General Gait Details: Slightly guarded with 1x standing rest break due to dyspnea; pt states this is baseline. HR up to 108 bpm during activity. CGA for safety   Stairs Stairs: Yes Stairs assistance: Contact guard assist Stair Management: Step to pattern, Forwards, One rail Left, One rail Right   General stair comments: CGA for safety. Verbal cues for safe sequencing with stair navigation. Stepping up with R and down with the L     Balance Overall balance assessment: Needs assistance Sitting-balance support: No upper extremity supported, Feet supported Sitting balance-Leahy Scale: Good     Standing balance support: Bilateral upper extremity supported, During functional activity, Reliant on assistive device for balance Standing balance-Leahy Scale: Poor Standing balance comment: needs UE support for balance.        Communication Communication Communication: No apparent difficulties  Cognition Arousal: Alert Behavior During Therapy: WFL for tasks assessed/performed   PT - Cognitive impairments: No apparent impairments     Following commands: Intact      Cueing Cueing Techniques: Verbal cues     General Comments General comments (skin integrity, edema, etc.): HR up to 108 bpm with activity. Pt with one episode of dyspnea that improved with standing rest break.      Pertinent  Vitals/Pain Pain Assessment Pain Assessment: Faces Faces Pain Scale: Hurts even more Pain Location: Incision sites Pain Descriptors /  Indicators: Aching, Operative site guarding Pain Intervention(s): Limited activity within patient's tolerance, Monitored during session     PT Goals (current goals can now be found in the care plan section) Acute Rehab PT Goals Patient Stated Goal: Get well, reduce pain, return home PT Goal Formulation: With patient Time For Goal Achievement: 07/22/24 Potential to Achieve Goals: Good Progress towards PT goals: Progressing toward goals    Frequency    Min 2X/week      PT Plan  Continue with current POC        AM-PAC PT 6 Clicks Mobility   Outcome Measure  Help needed turning from your back to your side while in a flat bed without using bedrails?: None Help needed moving from lying on your back to sitting on the side of a flat bed without using bedrails?: A Little Help needed moving to and from a bed to a chair (including a wheelchair)?: A Little Help needed standing up from a chair using your arms (e.g., wheelchair or bedside chair)?: A Little Help needed to walk in hospital room?: A Little Help needed climbing 3-5 steps with a railing? : A Little 6 Click Score: 19    End of Session Equipment Utilized During Treatment: Gait belt Activity Tolerance: Patient tolerated treatment well Patient left: in chair;with call bell/phone within reach;with family/visitor present;with chair alarm set   PT Visit Diagnosis: Unsteadiness on feet (R26.81);Other abnormalities of gait and mobility (R26.89);Muscle weakness (generalized) (M62.81);Difficulty in walking, not elsewhere classified (R26.2);Pain Pain - Right/Left: Left (bil) Pain - part of body: Hip     Time: 8543-8491 PT Time Calculation (min) (ACUTE ONLY): 12 min  Charges:    $Therapeutic Activity: 8-22 mins PT General Charges $$ ACUTE PT VISIT: 1 Visit                     Dorothyann Maier, DPT, CLT  Acute Rehabilitation Services Office: [phone] (Secure chat preferred)    Dorothyann VEAR Maier 07/09/2024, 3:17  PM

## 2024-07-10 DIAGNOSIS — D500 Iron deficiency anemia secondary to blood loss (chronic): Secondary | ICD-10-CM

## 2024-07-10 DIAGNOSIS — I2510 Atherosclerotic heart disease of native coronary artery without angina pectoris: Secondary | ICD-10-CM | POA: Diagnosis not present

## 2024-07-10 DIAGNOSIS — Z95828 Presence of other vascular implants and grafts: Secondary | ICD-10-CM | POA: Diagnosis not present

## 2024-07-10 DIAGNOSIS — I4891 Unspecified atrial fibrillation: Secondary | ICD-10-CM | POA: Diagnosis not present

## 2024-07-10 LAB — CBC
HCT: 28.6 % — ABNORMAL LOW (ref 39.0–52.0)
Hemoglobin: 9.4 g/dL — ABNORMAL LOW (ref 13.0–17.0)
MCH: 30.4 pg (ref 26.0–34.0)
MCHC: 32.9 g/dL (ref 30.0–36.0)
MCV: 92.6 fL (ref 80.0–100.0)
Platelets: 126 K/uL — ABNORMAL LOW (ref 150–400)
RBC: 3.09 MIL/uL — ABNORMAL LOW (ref 4.22–5.81)
RDW: 13.9 % (ref 11.5–15.5)
WBC: 7.2 K/uL (ref 4.0–10.5)
nRBC: 0.3 % — ABNORMAL HIGH (ref 0.0–0.2)

## 2024-07-10 LAB — BASIC METABOLIC PANEL WITH GFR
Anion gap: 8 (ref 5–15)
BUN: 17 mg/dL (ref 8–23)
CO2: 25 mmol/L (ref 22–32)
Calcium: 8.9 mg/dL (ref 8.9–10.3)
Chloride: 100 mmol/L (ref 98–111)
Creatinine, Ser: 1.23 mg/dL (ref 0.61–1.24)
GFR, Estimated: >60 mL/min (ref >60)
Glucose, Bld: 126 mg/dL — ABNORMAL HIGH (ref 70–99)
Potassium: 4.5 mmol/L (ref 3.5–5.1)
Sodium: 133 mmol/L — ABNORMAL LOW (ref 135–145)

## 2024-07-10 LAB — GLUCOSE, CAPILLARY
Glucose-Capillary: 119 mg/dL — ABNORMAL HIGH (ref 70–99)
Glucose-Capillary: 132 mg/dL — ABNORMAL HIGH (ref 70–99)
Glucose-Capillary: 127 mg/dL — ABNORMAL HIGH (ref 70–99)

## 2024-07-10 LAB — HEPARIN LEVEL (UNFRACTIONATED)
Heparin Unfractionated: 0.77 [IU]/mL — ABNORMAL HIGH (ref 0.30–0.70)
Heparin Unfractionated: 0.38 [IU]/mL (ref 0.30–0.70)

## 2024-07-10 MED ORDER — AMIODARONE HCL IN DEXTROSE 360-4.14 MG/200ML-% IV SOLN
60.0000 mg/h | INTRAVENOUS | Status: AC
  Administered 2024-07-10 (×2): 60 mg/h via INTRAVENOUS

## 2024-07-10 MED ORDER — ORAL CARE MOUTH RINSE: 15.0000 mL | OROMUCOSAL | Status: DC | PRN

## 2024-07-10 MED ORDER — AMIODARONE HCL IN DEXTROSE 360-4.14 MG/200ML-% IV SOLN
30.0000 mg/h | INTRAVENOUS | Status: DC
  Administered 2024-07-10 – 2024-07-11 (×3): 30 mg/h via INTRAVENOUS
  Filled 2024-07-10 (×2): qty 200

## 2024-07-10 NOTE — Progress Notes (Signed)
 PHARMACY - ANTICOAGULATION CONSULT NOTE  Pharmacy Consult for heparin  Indication: atrial fibrillation  No Known Allergies  Patient Measurements: Height: 5' 8 (172.7 cm) Weight: 76.2 kg (168 lb) IBW/kg (Calculated) : 68.4 HEPARIN  DW (KG): 76.2  Vital Signs: Temp: 98.3 F (36.8 C) (09/13 0411) Temp Source: Oral (09/13 0411) BP: 140/67 (09/13 0411) Pulse Rate: 76 (09/13 0411)  Labs: Recent Labs    07/08/24 0334 07/08/24 2132 07/09/24 0315 07/10/24 0334  HGB 10.6*  --  10.5* 9.4*  HCT 31.8*  --  31.0* 28.6*  PLT 149*  --  152 126*  HEPARINUNFRC  --  0.30 0.42 0.77*  CREATININE 1.40*  --  1.18 1.23    Estimated Creatinine Clearance: 56.4 mL/min (by C-G formula based on SCr of 1.23 mg/dL).   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 8 g 2 07/07/2024 at  4:30 AM   amLODipine  (NORVASC ) 5 MG tablet Take 1 tablet (5 mg total) by mouth daily. 90 tablet 3 07/06/2024   BREZTRI  AEROSPHERE 160-9-4.8 MCG/ACT AERO inhaler Inhale 2 puffs into the lungs 2 (two) times daily. 10.7 g 6 07/07/2024 at  4:30 AM   clopidogrel  (PLAVIX ) 75 MG tablet Take 1 tablet (75 mg total) by mouth daily. 90 tablet 3 07/01/2024   ezetimibe  (ZETIA ) 10 MG tablet Take 1 tablet (10 mg total) by mouth daily. 90 tablet 3  07/06/2024   losartan  (COZAAR ) 50 MG tablet Take 1 tablet (50 mg total) by mouth daily. 90 tablet 3 07/06/2024   metoprolol  succinate (TOPROL  XL) 50 MG 24 hr tablet Take 1 tablet (50 mg total) by mouth daily. Take with or immediately following a meal. 90 tablet 2 07/06/2024 at  5:30 PM   nitroGLYCERIN  (NITROSTAT ) 0.4 MG SL tablet Place 1 tablet (0.4 mg total) under the tongue every 5 (five) minutes as needed for chest pain. 25 tablet 3 Taking As Needed   ranolazine  (RANEXA ) 500 MG 12 hr tablet Take 1 tablet (500 mg total) by mouth 2 (two) times daily. 180 tablet 2 07/06/2024   rosuvastatin  (CRESTOR ) 40 MG tablet Take 1 tablet (40 mg total) by mouth daily. 90 tablet 3 07/06/2024   Scheduled:   amLODipine   5 mg Oral Daily   budesonide -glycopyrrolate -formoterol   2 puff Inhalation BID   clopidogrel   75 mg Oral Daily   docusate sodium   100 mg Oral Daily   ezetimibe   10 mg Oral Daily   furosemide   10 mg Oral Daily   metoprolol  succinate  25 mg Oral Daily   pantoprazole   40 mg Oral QHS   ranolazine   500 mg Oral BID   rosuvastatin   40 mg Oral Daily   Infusions:   sodium chloride   amiodarone  60 mg/hr (07/10/24 0747)   Followed by   amiodarone      heparin  1,200 Units/hr (07/09/24 1130)    Assessment: Pt with a hx of AF and DCCV but not on AC. He is s/p fem-bypass. Heparin  has been ordered for anticoagulation before transitioning to NOAC.  9/13 AM update: -Heparin  level supratherapeutic at 0.77 on 1200 units/hr -Hgb downtrending at 9.4 this AM, PLT 126k this AM -Per RN, no signs of bleeding or pauses in infusion  Goal of Therapy:  Heparin  level 0.3-0.7 units/ml Monitor platelets by anticoagulation protocol: Yes   Plan:  -Decrease heparin  to 1100 units/hr -Monitor 8 hour heparin  level -Monitor Hgb, PLT -F/u switch to apixaban   Izetta Carl, PharmD PGY1 Pharmacy Resident [facility]  07/10/2024 9:42 AM

## 2024-07-10 NOTE — Progress Notes (Signed)
 PHARMACY - ANTICOAGULATION CONSULT NOTE  Pharmacy Consult for heparin  Indication: atrial fibrillation  No Known Allergies  Patient Measurements: Height: 5' 8 (172.7 cm) Weight: 76.2 kg (168 lb) IBW/kg (Calculated) : 68.4 HEPARIN  DW (KG): 76.2  Vital Signs: Temp: 98.3 F (36.8 C) (09/13 1612) Temp Source: Oral (09/13 1612) BP: 121/80 (09/13 1612) Pulse Rate: 120 (09/13 1612)  Labs: Recent Labs    07/08/24 0334 07/08/24 2132 07/09/24 0315 07/10/24 0334 07/10/24 1855  HGB 10.6*  --  10.5* 9.4*  --   HCT 31.8*  --  31.0* 28.6*  --   PLT 149*  --  152 126*  --   HEPARINUNFRC  --    < > 0.42 0.77* 0.38  CREATININE 1.40*  --  1.18 1.23  --    < > = values in this interval not displayed.    Estimated Creatinine Clearance: 56.4 mL/min (by C-G formula based on SCr of 1.23 mg/dL).   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 8 g 2 07/07/2024 at  4:30 AM   amLODipine  (NORVASC ) 5 MG tablet Take 1 tablet (5 mg total) by mouth daily. 90 tablet 3 07/06/2024   BREZTRI  AEROSPHERE 160-9-4.8 MCG/ACT AERO inhaler Inhale 2 puffs into the lungs 2 (two) times daily. 10.7 g 6 07/07/2024 at  4:30 AM   clopidogrel  (PLAVIX ) 75 MG tablet Take 1 tablet (75 mg total) by mouth daily. 90 tablet 3 07/01/2024    ezetimibe  (ZETIA ) 10 MG tablet Take 1 tablet (10 mg total) by mouth daily. 90 tablet 3 07/06/2024   losartan  (COZAAR ) 50 MG tablet Take 1 tablet (50 mg total) by mouth daily. 90 tablet 3 07/06/2024   metoprolol  succinate (TOPROL  XL) 50 MG 24 hr tablet Take 1 tablet (50 mg total) by mouth daily. Take with or immediately following a meal. 90 tablet 2 07/06/2024 at  5:30 PM   nitroGLYCERIN  (NITROSTAT ) 0.4 MG SL tablet Place 1 tablet (0.4 mg total) under the tongue every 5 (five) minutes as needed for chest pain. 25 tablet 3 Taking As Needed   ranolazine  (RANEXA ) 500 MG 12 hr tablet Take 1 tablet (500 mg total) by mouth 2 (two) times daily. 180 tablet 2 07/06/2024   rosuvastatin  (CRESTOR ) 40 MG tablet Take 1 tablet (40 mg total) by mouth daily. 90 tablet 3 07/06/2024   Scheduled:   amLODipine   5 mg Oral Daily   budesonide -glycopyrrolate -formoterol   2 puff Inhalation BID   clopidogrel   75 mg Oral Daily   docusate sodium   100 mg Oral Daily   ezetimibe   10 mg Oral Daily   furosemide   10 mg Oral Daily   metoprolol  succinate  25 mg Oral Daily   pantoprazole   40 mg Oral QHS   ranolazine   500 mg Oral BID   rosuvastatin   40 mg Oral Daily   Infusions:   sodium chloride      amiodarone  30 mg/hr (07/10/24 1408)   heparin  1,100 Units/hr (07/10/24 1230)    Assessment: Pt with a hx of AF and DCCV but not on AC. He is s/p fem-bypass. Heparin  has been ordered for anticoagulation before transitioning to NOAC.  Heparin  level this evening is within goal range.  No overt bleeding or complications noted.  Goal of Therapy:  Heparin  level 0.3-0.7 units/ml Monitor platelets by anticoagulation protocol: Yes   Plan:  -Contine IV heparin  at 1100 units/hr -Daily heparin  level and CBC. -F/u switch to apixaban   Harlene Barlow, Berdine BIRCH, BCPS, BCCP Clinical Pharmacist  07/10/2024 7:27 PM   [facility] [place] pharmacy phone numbers are listed on [email or web address]

## 2024-07-10 NOTE — Plan of Care (Signed)

## 2024-07-10 NOTE — Progress Notes (Signed)
 Rounding Note   Patient Name: Manuel Richardson Date of Encounter: 07/10/2024  [facility] Cardiologist: Newman JINNY Lawrence, MD   Subjective Back in Afib this morning, rates 120s to 130s.  BP 140/67.  Creatinine 1.23.  Hemoglobin 9.4.  He denies any chest pain, dyspnea, or lightheadedness.    Scheduled Meds:  amiodarone   200 mg Oral BID   Followed by   NOREEN ON 07/16/2024] amiodarone   200 mg Oral Daily   amLODipine   5 mg Oral Daily   budesonide -glycopyrrolate -formoterol   2 puff Inhalation BID   clopidogrel   75 mg Oral Daily   docusate sodium   100 mg Oral Daily   ezetimibe   10 mg Oral Daily   furosemide   10 mg Oral Daily   metoprolol  succinate  25 mg Oral Daily   pantoprazole   40 mg Oral QHS   ranolazine   500 mg Oral BID   rosuvastatin   40 mg Oral Daily   Continuous Infusions:  sodium chloride      heparin  1,200 Units/hr (07/09/24 1130)   PRN Meds: sodium chloride , acetaminophen  **OR** acetaminophen , albuterol , bisacodyl , calcium  carbonate, hydrALAZINE , HYDROmorphone  (DILAUDID ) injection, labetalol , nitroGLYCERIN , ondansetron , oxyCODONE -acetaminophen , phenol, polyethylene glycol, potassium chloride    Vital Signs  Vitals:   07/09/24 2004 07/09/24 2055 07/10/24 0027 07/10/24 0411  BP: 127/78  122/75 (!) 140/67  Pulse: 94 92 79 76  Resp: 18 (!) 21 15 19   Temp: 98.4 F (36.9 C)  98.7 F (37.1 C) 98.3 F (36.8 C)  TempSrc: Oral  Oral Oral  SpO2: 93% 96% 94% 91%  Weight:      Height:        Intake/Output Summary (Last 24 hours) at 07/10/2024 0625 Last data filed at 07/09/2024 1600 Gross per 24 hour  Intake 999.76 ml  Output 250 ml  Net 749.76 ml      07/07/2024    6:38 AM 07/06/2024    1:03 PM 06/16/2024   11:36 AM  Last 3 Weights  Weight (lbs) 168 lb 168 lb 6.4 oz 169 lb 6.4 oz  Weight (kg) 76.204 kg 76.386 kg 76.839 kg      Telemetry A-fib with RVR, rate 120s 130s- Personally Reviewed  ECG  No new ECG- Personally Reviewed  Physical Exam  GEN: No  acute distress.   Neck: No JVD Cardiac: Irregular, tachycardic  no murmurs, rubs, or gallops.  Respiratory: Clear to auscultation bilaterally. GI: Soft, nontender MS: No edema; No deformity. Neuro:  Nonfocal  Psych: Normal affect   Labs High Sensitivity Troponin:  No results for input(s): TROPONINIHS in the last 720 hours.   Chemistry Recent Labs  Lab 07/06/24 1400 07/07/24 1659 07/08/24 0334 07/09/24 0315 07/10/24 0334  NA 134*  --  133* 136 133*  K 4.6  --  5.3* 4.2 4.5  CL 101  --  100 101 100  CO2 21*  --  22 24 25   GLUCOSE 115*  --  251* 141* 126*  BUN 16  --  20 19 17   CREATININE 1.25*   < > 1.40* 1.18 1.23  CALCIUM  10.1  --  9.2 9.0 8.9  PROT 7.7  --   --   --   --   ALBUMIN  4.5  --   --   --   --   AST 29  --   --   --   --   ALT 39  --   --   --   --   ALKPHOS 67  --   --   --   --  BILITOT 0.6  --   --   --   --   GFRNONAA >60   < > 55* >60 >60  ANIONGAP 12  --  11 11 8    < > = values in this interval not displayed.    Lipids  Recent Labs  Lab 07/08/24 0334  CHOL 93  TRIG 60  HDL 43  LDLCALC 38  CHOLHDL 2.2    Hematology Recent Labs  Lab 07/08/24 0334 07/09/24 0315 07/10/24 0334  WBC 9.5 10.4 7.2  RBC 3.44* 3.42* 3.09*  HGB 10.6* 10.5* 9.4*  HCT 31.8* 31.0* 28.6*  MCV 92.4 90.6 92.6  MCH 30.8 30.7 30.4  MCHC 33.3 33.9 32.9  RDW 13.6 13.8 13.9  PLT 149* 152 126*   Thyroid  Recent Labs  Lab 07/09/24 0315  TSH 9.894*    BNPNo results for input(s): BNP, PROBNP in the last 168 hours.  DDimer No results for input(s): DDIMER in the last 168 hours.   Radiology  ECHOCARDIOGRAM COMPLETE Result Date: 07/09/2024    ECHOCARDIOGRAM REPORT   Patient Name:   KEIGHAN AMEZCUA Date of Exam: 07/09/2024 Medical Rec #:  [id number]      Height:       68.0 in Accession #:    [id number]     Weight:       168.0 lb Date of Birth:  1956/03/19     BSA:          1.898 m Patient Age:    70 years       BP:           157/80 mmHg Patient Gender: M               HR:           83 bpm. Exam Location:  Inpatient Procedure: 2D Echo and Intracardiac Opacification Agent (Both Spectral and Color            Flow Doppler were utilized during procedure). Indications:    Atrial Fibrillation  History:        Patient has prior history of Echocardiogram examinations. CAD.  Sonographer:    Charmaine Gaskins Referring Phys: [id number] SHENG L HALEY IMPRESSIONS  1. Left ventricular ejection fraction, by estimation, is 60 to 65%. The left ventricle has normal function. The left ventricle has no regional wall motion abnormalities. Left ventricular diastolic parameters were normal.  2. Right ventricular systolic function is normal. The right ventricular size is normal. Tricuspid regurgitation signal is inadequate for assessing PA pressure.  3. The mitral valve is normal in structure. Trivial mitral valve regurgitation. No evidence of mitral stenosis.  4. The aortic valve is tricuspid. Aortic valve regurgitation is not visualized. No aortic stenosis is present.  5. The inferior vena cava is normal in size with greater than 50% respiratory variability, suggesting right atrial pressure of 3 mmHg. FINDINGS  Left Ventricle: Left ventricular ejection fraction, by estimation, is 60 to 65%. The left ventricle has normal function. The left ventricle has no regional wall motion abnormalities. The left ventricular internal cavity size was normal in size. There is  no left ventricular hypertrophy. Left ventricular diastolic parameters were normal. Right Ventricle: The right ventricular size is normal. No increase in right ventricular wall thickness. Right ventricular systolic function is normal. Tricuspid regurgitation signal is inadequate for assessing PA pressure. Left Atrium: Left atrial size was normal in size. Right Atrium: Right atrial size was normal in size. Pericardium: Trivial pericardial effusion is present.  Presence of epicardial fat layer. Mitral Valve: The mitral valve is normal in structure.  Trivial mitral valve regurgitation. No evidence of mitral valve stenosis. Tricuspid Valve: The tricuspid valve is normal in structure. Tricuspid valve regurgitation is trivial. Aortic Valve: The aortic valve is tricuspid. Aortic valve regurgitation is not visualized. No aortic stenosis is present. Pulmonic Valve: The pulmonic valve was not well visualized. Pulmonic valve regurgitation is not visualized. Aorta: The aortic root and ascending aorta are structurally normal, with no evidence of dilitation. Venous: The inferior vena cava is normal in size with greater than 50% respiratory variability, suggesting right atrial pressure of 3 mmHg. IAS/Shunts: The interatrial septum was not well visualized.  LEFT VENTRICLE PLAX 2D LVIDd:         4.80 cm     Diastology LVIDs:         4.10 cm     LV e' medial:    9.14 cm/s LV PW:         0.90 cm     LV E/e' medial:  10.5 LV IVS:        0.90 cm     LV e' lateral:   9.03 cm/s LVOT diam:     2.20 cm     LV E/e' lateral: 10.6 LVOT Area:     3.80 cm  LV Volumes (MOD) LV vol d, MOD A2C: 69.5 ml LV vol d, MOD A4C: 85.9 ml LV vol s, MOD A2C: 25.8 ml LV vol s, MOD A4C: 30.6 ml LV SV MOD A2C:     43.7 ml LV SV MOD A4C:     85.9 ml LV SV MOD BP:      50.5 ml RIGHT VENTRICLE RV Basal diam:  2.20 cm RV Mid diam:    2.00 cm RV S prime:     15.20 cm/s LEFT ATRIUM             Index        RIGHT ATRIUM          Index LA diam:        3.50 cm 1.84 cm/m   RA Area:     8.56 cm LA Vol (A2C):   58.9 ml 31.04 ml/m  RA Volume:   14.50 ml 7.64 ml/m LA Vol (A4C):   38.3 ml 20.18 ml/m LA Biplane Vol: 47.1 ml 24.82 ml/m   AORTA Ao Asc diam: 3.70 cm MITRAL VALVE MV Area (PHT): 3.93 cm     SHUNTS MV Decel Time: 193 msec     Systemic Diam: 2.20 cm MV E velocity: 95.70 cm/s MV A velocity: 137.00 cm/s MV E/A ratio:  0.70 Lonni Nanas MD Electronically signed by Lonni Nanas MD Signature Date/Time: 07/09/2024/12:11:38 PM    Final     Cardiac Studies   Patient Profile   68 y.o. male  with a hx of CAD with CTO of RCA, hypertension, hyperlipidemia, diabetes, atrial fibrillation, PAD, history of left carotid to subclavian bypass, severe left subclavian stenosis, COPD, prior smoker, amaurosis fugax who is being seen 07/08/2024 for the evaluation of A-fib RVR  Assessment & Plan  Paroxysmal atrial fibrillation: Developed A-fib with RVR for about 12 hours postoperatively.  Started on IV amiodarone , converted to sinus rhythm.  He is now back in A-fib with RVR - Continue IV heparin .  Hemoglobin trending down, will monitor - Restart Amio drip given recurrent A-fib with RVR this morning  PAD: S/p EVAR, bilateral femoral endarterectomy and femorofemoral bypass, as well as left  iliac artery stenting on 07/07/2024.  - Continue Plavix , heparin  drip - Continue rosuvastatin   CAD: Has known CTO of RCA. - Continue Plavix , heparin  drip - Continue amlodipine  5 mg daily Toprol -XL 25 mg daily, Ranexa  500 mg twice daily - Continue rosuvastatin  40 mg daily, Zetia  10 mg daily  Anemia: Hemoglobin trending down, 9.4 this morning.  Will need to monitor closely.  Would hold off on converting from heparin  drip to Eliquis  until hemoglobin stabilized  For questions or updates, please contact [facility] Please consult [email or web address] for contact info under       Signed, Lonni LITTIE Nanas, MD  07/10/2024, 6:25 AM

## 2024-07-10 NOTE — Progress Notes (Signed)
  Progress Note    07/10/2024 7:49 AM 3 Days Post-Op  Subjective: Sitting comfortably this morning, feels as though breathing has improved.  Concerned that he is back in A-fib.    Vitals:   07/10/24 0027 07/10/24 0411  BP: 122/75 (!) 140/67  Pulse: 79 76  Resp: 15 19  Temp: 98.7 F (37.1 C) 98.3 F (36.8 C)  SpO2: 94% 91%    Physical Exam: General:  no distress Cardiac: Irregular Lungs:  non labored Incisions:  bilateral groin incisions are clean and dry Extremities:  brisk doppler flow bilateral PT; palpable pulse in fem fem Abdomen:  soft  CBC    Component Value Date/Time   WBC 7.2 07/10/2024 0334   RBC 3.09 (L) 07/10/2024 0334   HGB 9.4 (L) 07/10/2024 0334   HGB 14.4 01/19/2024 1607   HCT 28.6 (L) 07/10/2024 0334   HCT 42.7 01/19/2024 1607   PLT 126 (L) 07/10/2024 0334   PLT 187 01/19/2024 1607   MCV 92.6 07/10/2024 0334   MCV 91 01/19/2024 1607   MCH 30.4 07/10/2024 0334   MCHC 32.9 07/10/2024 0334   RDW 13.9 07/10/2024 0334   RDW 13.2 01/19/2024 1607   LYMPHSABS 0.6 (L) 04/01/2021 0145   MONOABS 0.4 04/01/2021 0145   EOSABS 0.0 04/01/2021 0145   BASOSABS 0.0 04/01/2021 0145    BMET    Component Value Date/Time   NA 133 (L) 07/10/2024 0334   NA 140 01/19/2024 1607   K 4.5 07/10/2024 0334   CL 100 07/10/2024 0334   CO2 25 07/10/2024 0334   GLUCOSE 126 (H) 07/10/2024 0334   BUN 17 07/10/2024 0334   BUN 13 01/19/2024 1607   CREATININE 1.23 07/10/2024 0334   CREATININE 0.83 03/30/2012 0921   CALCIUM  8.9 07/10/2024 0334   GFRNONAA >60 07/10/2024 0334   GFRAA  11/02/2010 0645    >60        The eGFR has been calculated using the MDRD equation. This calculation has not been validated in all clinical situations. eGFR's persistently <60 mL/min signify possible Chronic Kidney Disease.    INR    Component Value Date/Time   INR 1.0 07/06/2024 1400     Intake/Output Summary (Last 24 hours) at 07/10/2024 0749 Last data filed at 07/09/2024  1600 Gross per 24 hour  Intake 999.76 ml  Output 250 ml  Net 749.76 ml      Assessment/Plan:  68 y.o. male is s/p:  Bilateral open femoral artery exposure, EVAR (unilateral endograft), fem fem bypass and left CIA stent placement 07/07/2024 by Dr. Serene for AAA and RLE claudication.   3 Days Post-Op  Incisions healing nicely A-fib with RVR this morning-appreciate cardiology involvement Will continue to follow creatinine.  No plan for further diuresis Please continue aggressive pulmonary toilet Home once normal sinus rhythm and controlled with oral medications. Patient aware that he will be staying today.  Fonda FORBES [facility] [phone] 07/10/2024 7:49 AM

## 2024-07-11 DIAGNOSIS — D500 Iron deficiency anemia secondary to blood loss (chronic): Secondary | ICD-10-CM | POA: Diagnosis not present

## 2024-07-11 DIAGNOSIS — I4891 Unspecified atrial fibrillation: Secondary | ICD-10-CM | POA: Diagnosis not present

## 2024-07-11 DIAGNOSIS — Z95828 Presence of other vascular implants and grafts: Secondary | ICD-10-CM | POA: Diagnosis not present

## 2024-07-11 LAB — COMPREHENSIVE METABOLIC PANEL WITH GFR
ALT: 24 U/L (ref 0–44)
AST: 27 U/L (ref 15–41)
Albumin: 3 g/dL — ABNORMAL LOW (ref 3.5–5.0)
Alkaline Phosphatase: 50 U/L (ref 38–126)
Anion gap: 9 (ref 5–15)
BUN: 19 mg/dL (ref 8–23)
CO2: 25 mmol/L (ref 22–32)
Calcium: 9.1 mg/dL (ref 8.9–10.3)
Chloride: 100 mmol/L (ref 98–111)
Creatinine, Ser: 1.18 mg/dL (ref 0.61–1.24)
GFR, Estimated: >60 mL/min (ref >60)
Glucose, Bld: 134 mg/dL — ABNORMAL HIGH (ref 70–99)
Potassium: 4.5 mmol/L (ref 3.5–5.1)
Sodium: 134 mmol/L — ABNORMAL LOW (ref 135–145)
Total Bilirubin: 0.7 mg/dL (ref 0.0–1.2)
Total Protein: 5.6 g/dL — ABNORMAL LOW (ref 6.5–8.1)

## 2024-07-11 LAB — CBC
HCT: 27.7 % — ABNORMAL LOW (ref 39.0–52.0)
Hemoglobin: 9.1 g/dL — ABNORMAL LOW (ref 13.0–17.0)
MCH: 30.3 pg (ref 26.0–34.0)
MCHC: 32.9 g/dL (ref 30.0–36.0)
MCV: 92.3 fL (ref 80.0–100.0)
Platelets: 138 K/uL — ABNORMAL LOW (ref 150–400)
RBC: 3 MIL/uL — ABNORMAL LOW (ref 4.22–5.81)
RDW: 13.8 % (ref 11.5–15.5)
WBC: 5.3 K/uL (ref 4.0–10.5)
nRBC: 0.8 % — ABNORMAL HIGH (ref 0.0–0.2)

## 2024-07-11 LAB — HEPARIN LEVEL (UNFRACTIONATED): Heparin Unfractionated: 0.32 [IU]/mL (ref 0.30–0.70)

## 2024-07-11 MED ORDER — AMIODARONE HCL 200 MG PO TABS
200.0000 mg | ORAL_TABLET | Freq: Two times a day (BID) | ORAL | Status: DC
Start: 2024-07-11 — End: 2024-07-12
  Administered 2024-07-11 – 2024-07-12 (×3): 200 mg via ORAL
  Filled 2024-07-11 (×3): qty 1

## 2024-07-11 MED ORDER — APIXABAN 5 MG PO TABS
5.0000 mg | ORAL_TABLET | Freq: Two times a day (BID) | ORAL | Status: DC
  Administered 2024-07-11 – 2024-07-12 (×3): 5 mg via ORAL
  Filled 2024-07-11 (×3): qty 1

## 2024-07-11 MED ORDER — AMIODARONE HCL 200 MG PO TABS: 200.0000 mg | ORAL_TABLET | Freq: Every day | ORAL | Status: DC

## 2024-07-11 MED ORDER — AMIODARONE HCL 200 MG PO TABS: 200.0000 mg | ORAL_TABLET | Freq: Two times a day (BID) | ORAL | Status: DC

## 2024-07-11 NOTE — Plan of Care (Signed)
  Problem: Education: Goal: Knowledge of General Education information will improve Description: Including pain rating scale, medication(s)/side effects and non-pharmacologic comfort measures Outcome: Progressing   Problem: Health Behavior/Discharge Planning: Goal: Ability to manage health-related needs will improve Outcome: Progressing   Problem: Clinical Measurements: Goal: Ability to maintain clinical measurements within normal limits will improve Outcome: Progressing Goal: Will remain free from infection Outcome: Progressing Goal: Diagnostic test results will improve Outcome: Progressing Goal: Respiratory complications will improve Outcome: Progressing Goal: Cardiovascular complication will be avoided Outcome: Progressing   Problem: Activity: Goal: Risk for activity intolerance will decrease Outcome: Progressing   Problem: Nutrition: Goal: Adequate nutrition will be maintained Outcome: Progressing   Problem: Elimination: Goal: Will not experience complications related to bowel motility Outcome: Progressing Goal: Will not experience complications related to urinary retention Outcome: Progressing   Problem: Safety: Goal: Ability to remain free from injury will improve Outcome: Progressing   Problem: Skin Integrity: Goal: Risk for impaired skin integrity will decrease Outcome: Progressing   Problem: Education: Goal: Knowledge of prescribed regimen will improve Outcome: Progressing   Problem: Activity: Goal: Ability to tolerate increased activity will improve Outcome: Progressing   Problem: Bowel/Gastric: Goal: Gastrointestinal status for postoperative course will improve Outcome: Progressing   Problem: Clinical Measurements: Goal: Postoperative complications will be avoided or minimized Outcome: Progressing Goal: Signs and symptoms of graft occlusion will improve Outcome: Progressing   Problem: Skin Integrity: Goal: Demonstration of wound healing without  infection will improve Outcome: Progressing   Problem: Coping: Goal: Level of anxiety will decrease Outcome: Not Progressing

## 2024-07-11 NOTE — Progress Notes (Signed)
  Progress Note    07/11/2024 7:45 AM 4 Days Post-Op  Subjective: Sitting comfortably this morning, feels as though breathing has improved. NSR    Vitals:   07/10/24 2328 07/11/24 0423  BP: 116/75 131/71  Pulse: 71 98  Resp: 16 (!) 24  Temp: 98.1 F (36.7 C) 98.5 F (36.9 C)  SpO2: 91% 97%    Physical Exam: General:  no distress Cardiac: Irregular Lungs:  non labored Incisions:  bilateral groin incisions are clean and dry Extremities:  brisk doppler flow bilateral PT; palpable pulse in fem fem Abdomen:  soft  CBC    Component Value Date/Time   WBC 5.3 07/11/2024 0234   RBC 3.00 (L) 07/11/2024 0234   HGB 9.1 (L) 07/11/2024 0234   HGB 14.4 01/19/2024 1607   HCT 27.7 (L) 07/11/2024 0234   HCT 42.7 01/19/2024 1607   PLT 138 (L) 07/11/2024 0234   PLT 187 01/19/2024 1607   MCV 92.3 07/11/2024 0234   MCV 91 01/19/2024 1607   MCH 30.3 07/11/2024 0234   MCHC 32.9 07/11/2024 0234   RDW 13.8 07/11/2024 0234   RDW 13.2 01/19/2024 1607   LYMPHSABS 0.6 (L) 04/01/2021 0145   MONOABS 0.4 04/01/2021 0145   EOSABS 0.0 04/01/2021 0145   BASOSABS 0.0 04/01/2021 0145    BMET    Component Value Date/Time   NA 134 (L) 07/11/2024 0234   NA 140 01/19/2024 1607   K 4.5 07/11/2024 0234   CL 100 07/11/2024 0234   CO2 25 07/11/2024 0234   GLUCOSE 134 (H) 07/11/2024 0234   BUN 19 07/11/2024 0234   BUN 13 01/19/2024 1607   CREATININE 1.18 07/11/2024 0234   CREATININE 0.83 03/30/2012 0921   CALCIUM  9.1 07/11/2024 0234   GFRNONAA >60 07/11/2024 0234   GFRAA  11/02/2010 0645    >60        The eGFR has been calculated using the MDRD equation. This calculation has not been validated in all clinical situations. eGFR's persistently <60 mL/min signify possible Chronic Kidney Disease.    INR    Component Value Date/Time   INR 1.0 07/06/2024 1400     Intake/Output Summary (Last 24 hours) at 07/11/2024 0745 Last data filed at 07/10/2024 2300 Gross per 24 hour  Intake 840  ml  Output 1800 ml  Net -960 ml      Assessment/Plan:  68 y.o. male is s/p:  Bilateral open femoral artery exposure, EVAR (unilateral endograft), fem fem bypass and left CIA stent placement 07/07/2024 by Dr. Serene for AAA and RLE claudication.   4 Days Post-Op  Incisions healing nicely NSR - appreciate cardiology recs regarding oral regimen.  Will be discharged on DOAC, ASA. Please continue aggressive pulmonary toilet Home once normal sinus rhythm and controlled with oral medications. Patient aware that he will be staying today.  Fonda FORBES [facility] [phone] 07/11/2024 7:45 AM

## 2024-07-11 NOTE — Progress Notes (Signed)
 Rounding Note   Patient Name: Manuel Richardson Date of Encounter: 07/11/2024  [facility] Cardiologist: Newman JINNY Lawrence, MD   Subjective Converted to NSR yesterday evening, was in Afib ~12 hours.  BP 131/71.  Cr 1.18.  Hgb stable at 9.1.  Currently denies any chest pain or dyspnea   Scheduled Meds:  amLODipine   5 mg Oral Daily   budesonide -glycopyrrolate -formoterol   2 puff Inhalation BID   clopidogrel   75 mg Oral Daily   docusate sodium   100 mg Oral Daily   ezetimibe   10 mg Oral Daily   furosemide   10 mg Oral Daily   metoprolol  succinate  25 mg Oral Daily   pantoprazole   40 mg Oral QHS   ranolazine   500 mg Oral BID   rosuvastatin   40 mg Oral Daily   Continuous Infusions:  sodium chloride      amiodarone  30 mg/hr (07/11/24 0815)   heparin  1,100 Units/hr (07/10/24 1230)   PRN Meds: sodium chloride , acetaminophen  **OR** acetaminophen , albuterol , bisacodyl , calcium  carbonate, hydrALAZINE , HYDROmorphone  (DILAUDID ) injection, labetalol , nitroGLYCERIN , ondansetron , mouth rinse, oxyCODONE -acetaminophen , phenol, polyethylene glycol, potassium chloride    Vital Signs  Vitals:   07/10/24 1612 07/10/24 1955 07/10/24 2328 07/11/24 0423  BP: 121/80 123/77 116/75 131/71  Pulse: (!) 120 76 71 98  Resp: 20 18 16  (!) 24  Temp: 98.3 F (36.8 C) 97.9 F (36.6 C) 98.1 F (36.7 C) 98.5 F (36.9 C)  TempSrc: Oral Oral Oral Oral  SpO2: 93% 91% 91% 97%  Weight:      Height:        Intake/Output Summary (Last 24 hours) at 07/11/2024 0909 Last data filed at 07/10/2024 2300 Gross per 24 hour  Intake 600 ml  Output 1600 ml  Net -1000 ml      07/07/2024    6:38 AM 07/06/2024    1:03 PM 06/16/2024   11:36 AM  Last 3 Weights  Weight (lbs) 168 lb 168 lb 6.4 oz 169 lb 6.4 oz  Weight (kg) 76.204 kg 76.386 kg 76.839 kg      Telemetry A-fib with RVR, rate 120s 130s- Personally Reviewed  ECG  No new ECG- Personally Reviewed  Physical Exam  GEN: No acute distress.   Neck: No  JVD Cardiac: Irregular, tachycardic  no murmurs, rubs, or gallops.  Respiratory: Clear to auscultation bilaterally. GI: Soft, nontender MS: No edema; No deformity. Neuro:  Nonfocal  Psych: Normal affect   Labs High Sensitivity Troponin:  No results for input(s): TROPONINIHS in the last 720 hours.   Chemistry Recent Labs  Lab 07/06/24 1400 07/07/24 1659 07/09/24 0315 07/10/24 0334 07/11/24 0234  NA 134*   < > 136 133* 134*  K 4.6   < > 4.2 4.5 4.5  CL 101   < > 101 100 100  CO2 21*   < > 24 25 25   GLUCOSE 115*   < > 141* 126* 134*  BUN 16   < > 19 17 19   CREATININE 1.25*   < > 1.18 1.23 1.18  CALCIUM  10.1   < > 9.0 8.9 9.1  PROT 7.7  --   --   --  5.6*  ALBUMIN  4.5  --   --   --  3.0*  AST 29  --   --   --  27  ALT 39  --   --   --  24  ALKPHOS 67  --   --   --  50  BILITOT 0.6  --   --   --  0.7  GFRNONAA >60   < > >60 >60 >60  ANIONGAP 12   < > 11 8 9    < > = values in this interval not displayed.    Lipids  Recent Labs  Lab 07/08/24 0334  CHOL 93  TRIG 60  HDL 43  LDLCALC 38  CHOLHDL 2.2    Hematology Recent Labs  Lab 07/09/24 0315 07/10/24 0334 07/11/24 0234  WBC 10.4 7.2 5.3  RBC 3.42* 3.09* 3.00*  HGB 10.5* 9.4* 9.1*  HCT 31.0* 28.6* 27.7*  MCV 90.6 92.6 92.3  MCH 30.7 30.4 30.3  MCHC 33.9 32.9 32.9  RDW 13.8 13.9 13.8  PLT 152 126* 138*   Thyroid  Recent Labs  Lab 07/09/24 0315  TSH 9.894*    BNPNo results for input(s): BNP, PROBNP in the last 168 hours.  DDimer No results for input(s): DDIMER in the last 168 hours.   Radiology  ECHOCARDIOGRAM COMPLETE Result Date: 07/09/2024    ECHOCARDIOGRAM REPORT   Patient Name:   Manuel Richardson Date of Exam: 07/09/2024 Medical Rec #:  [id number]      Height:       68.0 in Accession #:    [id number]     Weight:       168.0 lb Date of Birth:  1956/08/03     BSA:          1.898 m Patient Age:    68 years       BP:           157/80 mmHg Patient Gender: M              HR:           83 bpm. Exam  Location:  Inpatient Procedure: 2D Echo and Intracardiac Opacification Agent (Both Spectral and Color            Flow Doppler were utilized during procedure). Indications:    Atrial Fibrillation  History:        Patient has prior history of Echocardiogram examinations. CAD.  Sonographer:    Charmaine Gaskins Referring Phys: [id number] SHENG L HALEY IMPRESSIONS  1. Left ventricular ejection fraction, by estimation, is 60 to 65%. The left ventricle has normal function. The left ventricle has no regional wall motion abnormalities. Left ventricular diastolic parameters were normal.  2. Right ventricular systolic function is normal. The right ventricular size is normal. Tricuspid regurgitation signal is inadequate for assessing PA pressure.  3. The mitral valve is normal in structure. Trivial mitral valve regurgitation. No evidence of mitral stenosis.  4. The aortic valve is tricuspid. Aortic valve regurgitation is not visualized. No aortic stenosis is present.  5. The inferior vena cava is normal in size with greater than 50% respiratory variability, suggesting right atrial pressure of 3 mmHg. FINDINGS  Left Ventricle: Left ventricular ejection fraction, by estimation, is 60 to 65%. The left ventricle has normal function. The left ventricle has no regional wall motion abnormalities. The left ventricular internal cavity size was normal in size. There is  no left ventricular hypertrophy. Left ventricular diastolic parameters were normal. Right Ventricle: The right ventricular size is normal. No increase in right ventricular wall thickness. Right ventricular systolic function is normal. Tricuspid regurgitation signal is inadequate for assessing PA pressure. Left Atrium: Left atrial size was normal in size. Right Atrium: Right atrial size was normal in size. Pericardium: Trivial pericardial effusion is present. Presence of epicardial fat layer. Mitral Valve: The mitral valve is normal  in structure. Trivial mitral valve  regurgitation. No evidence of mitral valve stenosis. Tricuspid Valve: The tricuspid valve is normal in structure. Tricuspid valve regurgitation is trivial. Aortic Valve: The aortic valve is tricuspid. Aortic valve regurgitation is not visualized. No aortic stenosis is present. Pulmonic Valve: The pulmonic valve was not well visualized. Pulmonic valve regurgitation is not visualized. Aorta: The aortic root and ascending aorta are structurally normal, with no evidence of dilitation. Venous: The inferior vena cava is normal in size with greater than 50% respiratory variability, suggesting right atrial pressure of 3 mmHg. IAS/Shunts: The interatrial septum was not well visualized.  LEFT VENTRICLE PLAX 2D LVIDd:         4.80 cm     Diastology LVIDs:         4.10 cm     LV e' medial:    9.14 cm/s LV PW:         0.90 cm     LV E/e' medial:  10.5 LV IVS:        0.90 cm     LV e' lateral:   9.03 cm/s LVOT diam:     2.20 cm     LV E/e' lateral: 10.6 LVOT Area:     3.80 cm  LV Volumes (MOD) LV vol d, MOD A2C: 69.5 ml LV vol d, MOD A4C: 85.9 ml LV vol s, MOD A2C: 25.8 ml LV vol s, MOD A4C: 30.6 ml LV SV MOD A2C:     43.7 ml LV SV MOD A4C:     85.9 ml LV SV MOD BP:      50.5 ml RIGHT VENTRICLE RV Basal diam:  2.20 cm RV Mid diam:    2.00 cm RV S prime:     15.20 cm/s LEFT ATRIUM             Index        RIGHT ATRIUM          Index LA diam:        3.50 cm 1.84 cm/m   RA Area:     8.56 cm LA Vol (A2C):   58.9 ml 31.04 ml/m  RA Volume:   14.50 ml 7.64 ml/m LA Vol (A4C):   38.3 ml 20.18 ml/m LA Biplane Vol: 47.1 ml 24.82 ml/m   AORTA Ao Asc diam: 3.70 cm MITRAL VALVE MV Area (PHT): 3.93 cm     SHUNTS MV Decel Time: 193 msec     Systemic Diam: 2.20 cm MV E velocity: 95.70 cm/s MV A velocity: 137.00 cm/s MV E/A ratio:  0.70 Lonni Nanas MD Electronically signed by Lonni Nanas MD Signature Date/Time: 07/09/2024/12:11:38 PM    Final     Cardiac Studies   Patient Profile   68 y.o. male with a hx of CAD with  CTO of RCA, hypertension, hyperlipidemia, diabetes, atrial fibrillation, PAD, history of left carotid to subclavian bypass, severe left subclavian stenosis, COPD, prior smoker, amaurosis fugax who is being seen 07/08/2024 for the evaluation of A-fib RVR  Assessment & Plan  Paroxysmal atrial fibrillation: Developed A-fib with RVR for about 12 hours postoperatively.  Started on IV amiodarone , converted to sinus rhythm.  Had recurrence of A-fib with RVR on 9/13 x 12 hours, restarted on amio gtt and converted to sinus - Switch from amio gtt to PO amio.  Recommend 200 mg BID x 2 weeks then reduce to 200 mg daily - Switch from heparin  gtt to eliquis   PAD: S/p EVAR, bilateral femoral endarterectomy  and femorofemoral bypass, as well as left iliac artery stenting on 07/07/2024.  - Continue Plavix .  Can switch from heparin  gtt to eliquis  - Continue rosuvastatin   CAD: Has known CTO of RCA. - Continue Plavix .  Will switch from heparin  gtt to Eliquis  - Continue amlodipine  5 mg daily Toprol -XL 25 mg daily, Ranexa  500 mg twice daily - Continue rosuvastatin  40 mg daily, Zetia  10 mg daily  Anemia: Hemoglobin stable at 9.1 this morning, will monitor with starting DOAC as above  For questions or updates, please contact [facility] Please consult [email or web address] for contact info under       Signed, Lonni LITTIE Nanas, MD  07/11/2024, 9:09 AM

## 2024-07-11 NOTE — Progress Notes (Signed)
 PHARMACY - ANTICOAGULATION CONSULT NOTE  Pharmacy Consult for heparin  to apixaban   Indication: atrial fibrillation  No Known Allergies  Patient Measurements: Height: 5' 8 (172.7 cm) Weight: 76.2 kg (168 lb) IBW/kg (Calculated) : 68.4 HEPARIN  DW (KG): 76.2  Vital Signs: Temp: 98.2 F (36.8 C) (09/14 0900) Temp Source: Oral (09/14 0900) BP: 134/65 (09/14 0900) Pulse Rate: 98 (09/14 0423)  Labs: Recent Labs    07/09/24 0315 07/10/24 0334 07/10/24 1855 07/11/24 0234  HGB 10.5* 9.4*  --  9.1*  HCT 31.0* 28.6*  --  27.7*  PLT 152 126*  --  138*  HEPARINUNFRC 0.42 0.77* 0.38 0.32  CREATININE 1.18 1.23  --  1.18    Estimated Creatinine Clearance: 58.8 mL/min (by C-G formula based on SCr of 1.18 mg/dL).   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 8 g 2 07/07/2024 at  4:30 AM   amLODipine  (NORVASC ) 5 MG tablet Take 1 tablet (5 mg total) by mouth daily. 90 tablet 3 07/06/2024   BREZTRI  AEROSPHERE 160-9-4.8 MCG/ACT AERO inhaler Inhale 2 puffs into the lungs 2 (two) times daily. 10.7 g 6 07/07/2024 at  4:30 AM   clopidogrel  (PLAVIX ) 75 MG tablet Take 1 tablet (75 mg total) by mouth daily. 90 tablet 3 07/01/2024   ezetimibe  (ZETIA ) 10 MG tablet Take 1 tablet (10 mg total) by mouth daily. 90  tablet 3 07/06/2024   losartan  (COZAAR ) 50 MG tablet Take 1 tablet (50 mg total) by mouth daily. 90 tablet 3 07/06/2024   metoprolol  succinate (TOPROL  XL) 50 MG 24 hr tablet Take 1 tablet (50 mg total) by mouth daily. Take with or immediately following a meal. 90 tablet 2 07/06/2024 at  5:30 PM   nitroGLYCERIN  (NITROSTAT ) 0.4 MG SL tablet Place 1 tablet (0.4 mg total) under the tongue every 5 (five) minutes as needed for chest pain. 25 tablet 3 Taking As Needed   ranolazine  (RANEXA ) 500 MG 12 hr tablet Take 1 tablet (500 mg total) by mouth 2 (two) times daily. 180 tablet 2 07/06/2024   rosuvastatin  (CRESTOR ) 40 MG tablet Take 1 tablet (40 mg total) by mouth daily. 90 tablet 3 07/06/2024   Scheduled:   amiodarone   200 mg Oral BID   Followed by   NOREEN ON 07/25/2024] amiodarone   200 mg Oral Daily   amLODipine   5 mg Oral Daily   apixaban   5 mg Oral BID   budesonide -glycopyrrolate -formoterol   2 puff Inhalation BID   clopidogrel   75 mg Oral Daily   docusate sodium   100 mg Oral Daily   ezetimibe   10 mg Oral Daily   furosemide   10 mg Oral Daily   metoprolol  succinate  25 mg Oral Daily  pantoprazole   40 mg Oral QHS   ranolazine   500 mg Oral BID   rosuvastatin   40 mg Oral Daily   Infusions:   sodium chloride       Assessment: Pt with a hx of AF and DCCV but not on AC. He is s/p fem-bypass. He was on heparin  drip, but now transitioning to apixaban . He does not meet any of the 3 criteria for reduced dose apixaban  for afib (Scr >1.5, Age >80 years, <60 kg).  Goal of Therapy:  Heparin  level 0.3-0.7 units/ml Monitor platelets by anticoagulation protocol: Yes   Plan:  -Discontinue heparin  infusion -Initiate apixaban  5 mg PO BID -Monitor Hgb, PLT and signs of bleeding  Izetta Carl, PharmD PGY1 Pharmacy Resident [facility]  07/11/2024 9:54 AM

## 2024-07-12 ENCOUNTER — Other Ambulatory Visit (HOSPITAL_COMMUNITY): Payer: Self-pay

## 2024-07-12 DIAGNOSIS — D62 Acute posthemorrhagic anemia: Secondary | ICD-10-CM | POA: Diagnosis not present

## 2024-07-12 DIAGNOSIS — I480 Paroxysmal atrial fibrillation: Secondary | ICD-10-CM | POA: Diagnosis not present

## 2024-07-12 DIAGNOSIS — I739 Peripheral vascular disease, unspecified: Secondary | ICD-10-CM | POA: Diagnosis not present

## 2024-07-12 LAB — BASIC METABOLIC PANEL WITH GFR
Anion gap: 9 (ref 5–15)
BUN: 19 mg/dL (ref 8–23)
CO2: 23 mmol/L (ref 22–32)
Calcium: 8.5 mg/dL — ABNORMAL LOW (ref 8.9–10.3)
Chloride: 102 mmol/L (ref 98–111)
Creatinine, Ser: 1.31 mg/dL — ABNORMAL HIGH (ref 0.61–1.24)
GFR, Estimated: 60 mL/min — ABNORMAL LOW (ref >60)
Glucose, Bld: 121 mg/dL — ABNORMAL HIGH (ref 70–99)
Potassium: 4.3 mmol/L (ref 3.5–5.1)
Sodium: 134 mmol/L — ABNORMAL LOW (ref 135–145)

## 2024-07-12 LAB — CBC
HCT: 26 % — ABNORMAL LOW (ref 39.0–52.0)
Hemoglobin: 8.6 g/dL — ABNORMAL LOW (ref 13.0–17.0)
MCH: 30.6 pg (ref 26.0–34.0)
MCHC: 33.1 g/dL (ref 30.0–36.0)
MCV: 92.5 fL (ref 80.0–100.0)
Platelets: 132 K/uL — ABNORMAL LOW (ref 150–400)
RBC: 2.81 MIL/uL — ABNORMAL LOW (ref 4.22–5.81)
RDW: 13.8 % (ref 11.5–15.5)
WBC: 4.5 K/uL (ref 4.0–10.5)
nRBC: 0.9 % — ABNORMAL HIGH (ref 0.0–0.2)

## 2024-07-12 LAB — T4, FREE: Free T4: 0.85 ng/dL (ref 0.61–1.12)

## 2024-07-12 MED ORDER — AMIODARONE HCL 200 MG PO TABS
ORAL_TABLET | ORAL | 0 refills | Status: DC
  Filled 2024-07-12: qty 58, 44d supply, fill #0

## 2024-07-12 MED ORDER — OXYCODONE-ACETAMINOPHEN 5-325 MG PO TABS
1.0000 | ORAL_TABLET | Freq: Four times a day (QID) | ORAL | 0 refills | Status: DC | PRN
  Filled 2024-07-12: qty 20, 5d supply, fill #0

## 2024-07-12 MED ORDER — APIXABAN 5 MG PO TABS
5.0000 mg | ORAL_TABLET | Freq: Two times a day (BID) | ORAL | 0 refills | Status: DC
  Filled 2024-07-12: qty 60, 30d supply, fill #0

## 2024-07-12 NOTE — TOC Transition Note (Signed)
 Transition of Care (TOC) - Discharge Note Rayfield Gobble RN, BSN Inpatient Care Management Unit 4E- RN Case Manager See Treatment Team for direct phone #   Patient Details  Name: Manuel Richardson MRN: [id number] Date of Birth: 1956/03/26  Transition of Care [facility]) CM/SW Contact:  Gobble [name], RN Phone Number: 07/12/2024, 12:14 PM   Clinical Narrative:    Pt stable for transition home today, [facility] office made referral to [facility] for any HH needs- per PT notes- no follow up needed.  Pt declined RW for discharge.  [facility] liaison updated- no HH needs .  Per pt he uses home 02 w/ [employer] has concentrator at home, asking about portable concentrator. Call made to [employer] liaison- Mitch- per order on file for pt w/ [employer] his 02 is for nocturnal use only, pt would not qualify for a portable concentrator at this time. Advised pt to follow up with his pulmonologist for future 02 needs.    No further Inpt care management needs noted. Family to transport home   Final next level of care: Home/Self Care Barriers to Discharge: Barriers Resolved   Patient Goals and CMS Choice Patient states their goals for this hospitalization and ongoing recovery are:: return home   Choice offered to / list presented to : NA      Discharge Placement               Home        Discharge Plan and Services Additional resources added to the After Visit Summary for     Discharge Planning Services: CM Consult Post Acute Care Choice: NA          DME Arranged: N/A DME Agency: NA       HH Arranged: NA HH Agency: [facility]     Representative spoke with at [facility] Agency: Zebedee  Social Drivers of Health (SDOH) Interventions SDOH Screenings   Food Insecurity: No Food Insecurity (07/08/2024)  Housing: Low Risk  (07/08/2024)  Transportation Needs: No Transportation Needs (07/08/2024)  Utilities: Not At Risk (07/08/2024)  Depression (PHQ2-9): Low Risk  (02/23/2024)  Social  Connections: Unknown (07/08/2024)  Tobacco Use: Medium Risk (07/09/2024)     Readmission Risk Interventions     No data to display

## 2024-07-12 NOTE — Progress Notes (Signed)
  Progress Note    07/12/2024 7:57 AM 5 Days Post-Op  Subjective:  ready to go home   Vitals:   07/12/24 0337 07/12/24 0754  BP: 115/64 136/67  Pulse: 71 71  Resp: 20 17  Temp: 98.3 F (36.8 C) 98 F (36.7 C)  SpO2: 94% 94%   Physical Exam: Lungs:  non labored Incisions:  groin incisions c/d/i Extremities:  palpable R PT; L foot warm but no palpable pulses Abdomen:  soft Neurologic: A&O  CBC    Component Value Date/Time   WBC 4.5 07/12/2024 0332   RBC 2.81 (L) 07/12/2024 0332   HGB 8.6 (L) 07/12/2024 0332   HGB 14.4 01/19/2024 1607   HCT 26.0 (L) 07/12/2024 0332   HCT 42.7 01/19/2024 1607   PLT 132 (L) 07/12/2024 0332   PLT 187 01/19/2024 1607   MCV 92.5 07/12/2024 0332   MCV 91 01/19/2024 1607   MCH 30.6 07/12/2024 0332   MCHC 33.1 07/12/2024 0332   RDW 13.8 07/12/2024 0332   RDW 13.2 01/19/2024 1607   LYMPHSABS 0.6 (L) 04/01/2021 0145   MONOABS 0.4 04/01/2021 0145   EOSABS 0.0 04/01/2021 0145   BASOSABS 0.0 04/01/2021 0145    BMET    Component Value Date/Time   NA 134 (L) 07/12/2024 0332   NA 140 01/19/2024 1607   K 4.3 07/12/2024 0332   CL 102 07/12/2024 0332   CO2 23 07/12/2024 0332   GLUCOSE 121 (H) 07/12/2024 0332   BUN 19 07/12/2024 0332   BUN 13 01/19/2024 1607   CREATININE 1.31 (H) 07/12/2024 0332   CREATININE 0.83 03/30/2012 0921   CALCIUM  8.5 (L) 07/12/2024 0332   GFRNONAA 60 (L) 07/12/2024 0332   GFRAA  11/02/2010 0645    >60        The eGFR has been calculated using the MDRD equation. This calculation has not been validated in all clinical situations. eGFR's persistently <60 mL/min signify possible Chronic Kidney Disease.    INR    Component Value Date/Time   INR 1.0 07/06/2024 1400     Intake/Output Summary (Last 24 hours) at 07/12/2024 0757 Last data filed at 07/12/2024 0755 Gross per 24 hour  Intake 500 ml  Output 1610 ml  Net -1110 ml     Assessment/Plan:  68 y.o. male is s/p Bilateral open femoral artery  exposure, EVAR (unilateral endograft), fem fem bypass and left CIA stent placement 07/07/2024 by Dr. Serene for AAA and RLE claudication  5 Days Post-Op   Incisions healing well BLE well perfused with palpable R PT pulse Eliquis  restarted [facility] for discharge when cleared by Cardiology    Donnice Sender, PA-C [facility] [phone] 07/12/2024 7:57 AM

## 2024-07-12 NOTE — Progress Notes (Addendum)
 Progress Note  Patient Name: Manuel Richardson Date of Encounter: 07/12/2024 [facility] Cardiologist: Newman JINNY Lawrence, MD    Interval Summary   Patient reports feeling well today. No chest pain  or shortness of breath. Maintaining NSR. Hoping that he can go home today   Vital Signs Vitals:   07/11/24 2009 07/12/24 0000 07/12/24 0337 07/12/24 0754  BP: 132/71 117/74 115/64 136/67  Pulse: 74 72 71 71  Resp: (!) 21 (!) 22 20 17   Temp: 98.7 F (37.1 C) 98.4 F (36.9 C) 98.3 F (36.8 C) 98 F (36.7 C)  TempSrc: Oral Oral Oral Oral  SpO2: 96% 94% 94% 94%  Weight:      Height:        Intake/Output Summary (Last 24 hours) at 07/12/2024 0851 Last data filed at 07/12/2024 0800 Gross per 24 hour  Intake 500 ml  Output 1560 ml  Net -1060 ml      07/07/2024    6:38 AM 07/06/2024    1:03 PM 06/16/2024   11:36 AM  Last 3 Weights  Weight (lbs) 168 lb 168 lb 6.4 oz 169 lb 6.4 oz  Weight (kg) 76.204 kg 76.386 kg 76.839 kg      Telemetry/ECG  NSR - Personally Reviewed  Physical Exam  GEN: No acute distress.  Laying in the bed with head elevated  Neck: No JVD Cardiac:  RRR, no murmurs, rubs, or gallops.  Respiratory: Breathing unlabored  GI: non-distended  MS: No edema in BLE  Assessment & Plan   PAF  - Patient had about 12 hours of atrial fibrillation postoperatively. Was treated with IV amiodarone  and converted to NSR. Had recurrence of afib on 9/13 for approx 12 hours, again treated with IV amiodarone  and converted to NSR  - Yesterday patient was transitioned from IV amiodarone  to PO amiodarone   - Maintaining NSR per telemetry  - Continue PO amiodarone  200 mg BID for total of 14 days, then reduce to 200 mg daily . Plan to stop amiodarone  in the outpatient setting if patient continues to maintain NSR  - LFTs normal this admission. TSH elevated to 9.894. Ordered free T4  - Continue metoprolol  succinate 25 mg daily  - Discussed triggers for afib including caffeine,  alcohol, URIs. Discussed monitoring HR at home  - Continue eliquis  5 mg BID - denies bleeding on eliquis . Educated patient on the importance of monitoring for bleeding.  - Hemoglobin down to 8.6 this AM, was 9.1 yesterday. His hemoglobin has been downtrending since 9/9 when it was 15.4. Patient denies bleeding. Will discuss with MD    PAD - S/p EVAR, bilateral femoral endarterectomy and femorofemoral bypass, as well as left iliac artery stenting on 07/07/2024.  - Followed by Vascular Surgery  - Continue plavix  75 mg daily and crestor  40 mg daily   CAD  - Patient has known CTO of the RCA  - No chest pain  - Continue plavix  75 mg daily  - Continue amlodipine  5 mg daily, metoprolol  succinate 25 mg daily, ranexa  500 mg BID   - Continue crestor  40 mg daily, zetia  10 mg daily   HTN  - BP well controlled   HLD  - Lipid panel 06/2024 showed LDL 38  - Continue crestor  40 mg daily and zetia  10 mg daily   For questions or updates, please contact [facility] Please consult [email or web address] for contact info under         Signed, Rollo FABIENE Louder, PA-C

## 2024-07-12 NOTE — Progress Notes (Addendum)
 DISCHARGE NOTE HOME AUSAR Manuel Richardson to be discharged Home per MD order. Discussed prescriptions and follow up appointments with the patient. Prescriptions given to patient; medication list explained in detail. Patient verbalized understanding.  Skin clean, dry and intact without evidence of skin break down, no evidence of skin tears noted. IV catheter discontinued intact. Site without signs and symptoms of complications. Dressing and pressure applied. Pt denies pain at the site currently. No complaints noted.  See LDA for incisions at discharge Patient free of lines, drains, and wounds.   An After Visit Summary (AVS) was printed and given to the patient. Patient escorted via wheelchair, and discharged home via private auto.  Peyton SHAUNNA Pepper, RN

## 2024-07-13 NOTE — Anesthesia Postprocedure Evaluation (Signed)
 Anesthesia Post Note  Patient: Manuel Richardson  Procedure(s) Performed: INSERTION, ENDOVASCULAR STENT GRAFT, AORTA, aneurysms repair CREATION, BYPASS, ARTERIAL, FEMORAL TO FEMORAL, USING GRAFT (Bilateral) ANGIOPLASTY, USING PATCH GRAFT (Left: Groin) Bilateral ENDARTERECTOMY, FEMORAL (Bilateral: Groin) INTRAVASCULAR LITHOTRIPSY, External Iliac Artery (Left: Groin)     Patient location during evaluation: PACU Anesthesia Type: General Level of consciousness: awake and alert Pain management: pain level controlled Vital Signs Assessment: post-procedure vital signs reviewed and stable Respiratory status: spontaneous breathing, nonlabored ventilation and respiratory function stable Cardiovascular status: blood pressure returned to baseline and stable Postop Assessment: no apparent nausea or vomiting Anesthetic complications: no   No notable events documented.                  Xadrian Craighead

## 2024-07-14 ENCOUNTER — Ambulatory Visit: Admitting: Cardiology

## 2024-07-14 NOTE — Discharge Summary (Signed)
 EVAR Discharge Summary   Manuel Richardson [birth year] 68 y.o. male  MRN: [id number]  Admission Date: 07/07/2024  Discharge Date: 07/12/24  Physician: Dr. Serene  Admission Diagnosis: Atherosclerosis of native artery of both lower extremities with intermittent claudication (HCC) [I70.213] S/P insertion of endovascular thoracic aortic stent graft [Z95.828] AAA (abdominal aortic aneurysm) [facility]) [I71.40]  Discharge Day services:   See progress note 07/12/2024  Hospital Course:  Mr. Manuel Richardson is a 68 year old male who was brought in as an outpatient and underwent endovascular repair of abdominal aortic aneurysm using aorto uni iliac endograft involving lithotripsy of the left common and external iliac artery bilateral femoral endarterectomies and femoral to femoral bypass by Dr. Serene on 07/07/2024.  He tolerated the procedure well and was admitted postoperatively.  Surgery was performed due to AAA and right leg claudication.  Postoperative day 1 he experienced atrial fibrillation with RVR.  Cardiology was consulted.  He was rate controlled on amiodarone  and eventually converted back to sinus rhythm.  At the time of discharge medication adjustments were made by cardiology including a DOAC and all of these medication recommendations were reflected on his after visit summary.  He will follow-up within the next couple weeks to see his cardiologist.  From a surgery standpoint he did very well during hospitalization.  At the time of discharge he had a palpable right PT pulse however had absent pedal pulses on the left which was consistent with preoperative exam.  He will follow-up in 1 month to see Dr. Serene with a CTA aorta with runoff.  He was discharged home in stable condition.  CBC    Component Value Date/Time   WBC 4.5 07/12/2024 0332   RBC 2.81 (L) 07/12/2024 0332   HGB 8.6 (L) 07/12/2024 0332   HGB 14.4 01/19/2024 1607   HCT 26.0 (L) 07/12/2024 0332   HCT 42.7 01/19/2024 1607    PLT 132 (L) 07/12/2024 0332   PLT 187 01/19/2024 1607   MCV 92.5 07/12/2024 0332   MCV 91 01/19/2024 1607   MCH 30.6 07/12/2024 0332   MCHC 33.1 07/12/2024 0332   RDW 13.8 07/12/2024 0332   RDW 13.2 01/19/2024 1607   LYMPHSABS 0.6 (L) 04/01/2021 0145   MONOABS 0.4 04/01/2021 0145   EOSABS 0.0 04/01/2021 0145   BASOSABS 0.0 04/01/2021 0145    BMET    Component Value Date/Time   NA 134 (L) 07/12/2024 0332   NA 140 01/19/2024 1607   K 4.3 07/12/2024 0332   CL 102 07/12/2024 0332   CO2 23 07/12/2024 0332   GLUCOSE 121 (H) 07/12/2024 0332   BUN 19 07/12/2024 0332   BUN 13 01/19/2024 1607   CREATININE 1.31 (H) 07/12/2024 0332   CREATININE 0.83 03/30/2012 0921   CALCIUM  8.5 (L) 07/12/2024 0332   GFRNONAA 60 (L) 07/12/2024 0332   GFRAA  11/02/2010 0645    >60        The eGFR has been calculated using the MDRD equation. This calculation has not been validated in all clinical situations. eGFR's persistently <60 mL/min signify possible Chronic Kidney Disease.         Discharge Diagnosis:  Atherosclerosis of native artery of both lower extremities with intermittent claudication (HCC) [I70.213] S/P insertion of endovascular thoracic aortic stent graft [S04.171] AAA (abdominal aortic aneurysm) (HCC) [I71.40]  Secondary Diagnosis: 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 07/07/2024   AAA (  abdominal aortic aneurysm) (HCC) 07/07/2024   Angina pectoris (HCC) 01/29/2024   Pre-op evaluation 01/05/2024   Coronary artery calcification 11/10/2023   Exertional dyspnea 11/04/2023   Atrial fibrillation with RVR (HCC) 03/28/2021   COVID-19 virus infection 03/28/2021   PAD (peripheral artery disease) (HCC) 09/21/2018   Bilateral carotid bruits 09/21/2018   Tobacco use disorder 07/18/2014   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/26/2012   Coronary artery disease 05/26/2012   ED (erectile  dysfunction) 03/30/2012   Primary hypertension 03/30/2012   Stage 3 severe COPD by GOLD classification (HCC) 03/30/2012   Annual physical exam 03/05/2011   Hyperlipidemia 03/05/2011   Carotid artery disease (HCC) 03/05/2011   Past Medical History:  Diagnosis Date   Anginal pain (HCC)    Carotid artery occlusion    COPD (chronic obstructive pulmonary disease) (HCC)    Coronary artery disease    Dyspnea    with exertion   Erectile dysfunction    Full dentures    GERD (gastroesophageal reflux disease)    Hyperlipidemia    Hypertension    Peripheral vascular disease (HCC)    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   amiodarone  200 MG tablet Commonly known as: PACERONE  Take 1 tablet (200 mg total) by mouth 2 (two) times daily for 14 days, THEN 1 tablet (200 mg total) daily. Start taking on: July 12, 2024   amLODipine  5 MG tablet Commonly known as: NORVASC  Take 1 tablet (5 mg total) by mouth daily.   Breztri  Aerosphere 160-9-4.8 MCG/ACT Aero inhaler Generic drug: budesonide -glycopyrrolate -formoterol  Inhale 2 puffs into the lungs 2 (two) times daily.   clopidogrel  75 MG tablet Commonly known as: PLAVIX  Take 1 tablet (75 mg total) by mouth daily.   Eliquis  5 MG Tabs tablet Generic drug: apixaban  Take 1 tablet (5 mg total) by mouth 2 (two) times daily.   ezetimibe  10 MG tablet Commonly known as: ZETIA  Take 1 tablet (10 mg total) by mouth daily.   losartan  50 MG tablet Commonly known as: COZAAR  Take 1 tablet (50 mg total) by mouth daily.   metoprolol  succinate 50 MG 24 hr tablet Commonly known as: Toprol  XL Take  1 tablet (50 mg total) by mouth daily. Take with or immediately following a meal.   nitroGLYCERIN  0.4 MG SL tablet Commonly known as: Nitrostat  Place 1 tablet (0.4 mg total) under the tongue every 5 (five) minutes as needed for chest pain.   oxyCODONE -acetaminophen  5-325 MG tablet Commonly known as: PERCOCET/ROXICET Take 1 tablet by mouth every 6 (six) hours as needed for moderate pain (pain score 4-6).   ranolazine  500 MG 12 hr tablet Commonly known as: Ranexa  Take 1 tablet (500 mg total) by mouth 2 (two) times daily.   rosuvastatin  40 MG tablet Commonly known as: CRESTOR  Take 1 tablet (40 mg total) by mouth daily.        Discharge Instructions:   [facility]  Discharge Instructions Endovascular Aortic Aneurysm Repair  Please refer to the following instructions for your post-procedure care. Your surgeon or Physician Assistant will  discuss any changes with you.  Activity  You are encouraged to walk as much as you can. You can slowly return to normal activities but must avoid strenuous activity and heavy lifting until your doctor tells you it's OK. Avoid activities such as vacuuming or swinging a gold club. It is normal to feel tired for several weeks after your surgery. Do not drive until your doctor gives the OK and you are no longer taking prescription pain medications. It is also normal to have difficulty with sleep habits, eating, and bowel movements after surgery. These will go away with time.  Bathing/Showering  You may shower after you go home. If you have an incision, do not soak in a bathtub, hot tub, or swim until the incision heals completely.  Incision Care  Shower every day. Clean your incision with mild soap and water. Pat the area dry with a clean towel. You do not need a bandage unless otherwise instructed. Do not apply any ointments or creams to your incision. If you clothing is irritating, you may cover your incision with a dry  gauze pad.  Diet  Resume your normal diet. There are no special food restrictions following this procedure. A low fat/low cholesterol diet is recommended for all patients with vascular disease. In order to heal from your surgery, it is CRITICAL to get adequate nutrition. Your body requires vitamins, minerals, and protein. Vegetables are the best source of vitamins and minerals. Vegetables also provide the perfect balance of protein. Processed food has little nutritional value, so try to avoid this.  Medications  Resume taking all of your medications unless your doctor or Physician Assistnat tells you not to. If your incision is causing pain, you may take over-the-counter pain relievers such as acetaminophen  (Tylenol ). If you were prescribed a stronger pain medication, please be aware these medications can cause nausea and constipation. Prevent nausea by taking the medication with a snack or meal. Avoid constipation by drinking plenty of fluids and eating foods with a high amount of fiber, such as fruits, vegetables, and grains. Do not take Tylenol  if you are taking prescription pain medications.   Follow up  Our office will schedule a follow-up appointment with a C.T. scan 3-4 weeks after your surgery.  Please call us  immediately for any of the following conditions  Severe or worsening pain in your legs or feet or in your abdomen back or chest. Increased pain, redness, drainage (pus) from your incision sit. Increased abdominal pain, bloating, nausea, vomiting or persistent diarrhea. Fever of 101 degrees or higher. Swelling in your leg (s),  Reduce your risk of vascular disease  Stop smoking. If you would like help call [facility] at 1-800-QUIT-NOW ((213)187-1387) or [place] at 7730704074. Manage your cholesterol Maintain a desired weight Control your diabetes Keep your blood pressure down  If you have questions, please call the office at [phone].   Disposition:  home  Patient's condition: is Good  Follow up: 1. Dr. Serene in 4 weeks with CTA protocol   Donnice Sender, PA-C [facility] [phone] 07/14/2024  3:44 PM   - For [facility]QI Registry use - Post-op:  Time to Extubation: [x]  In OR, [ ]  < 12 hrs, [ ]  12-24 hrs, [ ]  >=24 hrs Vasopressors Req. Post-op: No MI: No., [ ]  Troponin only, [ ]  EKG or Clinical New Arrhythmia: Yes CHF: No ICU Stay: 0 day in [facility] Transfusion: No       Complications: Resp failure: No., [ ]  Pneumonia, [ ]   Ventilator Chg in renal function: Yes.  , [x ] Inc. Cr > 0.5, [ ]  Temp. Dialysis,  [ ]  Permanent dialysis Leg ischemia: No., no Surgery needed, [ ]  Yes, Surgery needed,  [ ]  Amputation Bowel ischemia: No., [ ]  Medical Rx, [ ]  Surgical Rx Wound complication: No., [ ]  Superficial separation/infection, [ ]  Return to OR Return to OR: No  Return to OR for bleeding: No Stroke: No., [ ]  Minor, [ ]  Major  Discharge medications: Statin use:  Yes  ASA use:  No  Plavix  use:  Yes  Beta blocker use:  Yes  ARB use:  Yes ACEI use:  No CCB use:  Yes

## 2024-07-17 DIAGNOSIS — J439 Emphysema, unspecified: Secondary | ICD-10-CM | POA: Diagnosis not present

## 2024-07-17 DIAGNOSIS — J449 Chronic obstructive pulmonary disease, unspecified: Secondary | ICD-10-CM | POA: Diagnosis not present

## 2024-07-19 NOTE — Progress Notes (Unsigned)
 Cardiology Office Note:  .   Date:  07/20/2024  ID:  Manuel Richardson, DOB Jul 12, 1956, MRN [id number] PCP: Manuel Skates, DO  [facility] Providers Cardiologist:  Newman JINNY Lawrence, MD {  History of Present Illness: .   Manuel Richardson is a 68 y.o. male with history of CAD with CTO of RCA, hypertension, hyperlipidemia, diabetes, paroxysmal atrial fibrillation, PAD status post EVAR, bilateral femoral enterectomy and femoral-femoral bypass 06/2024, left iliac artery stenting, history of left carotid to subclavian bypass, severe left subclavian stenosis, COPD, prior smoker, amaurosis fugax.     Atrial fibrillation Noted June 2022 in the setting of COVID-19 hospitalization.  Plan for outpatient cardioversion but then lost in follow-up Reportedly followed up with [facility] and had another episode of atrial fibrillation in the setting of COPD exacerbation.  Heart monitor did not show A-fib.  Not started on Eliquis . 06/2024 admission for femoral bypass had episode of A-fib RVR, asymptomatic.  EF normal.  Started on IV amiodarone , converted to sinus rhythm after 1 day.  Had recurrence of A-fib during admission and converted once again.  Eliquis  started.  CAD Chronic symptoms of shortness of breath mixed with CAD and COPD. Abnormal stress test 12/2023. 01/2024 LHC with failed PCI of the CTO of the RCA, unable to cross with wire.  Medical management.  Vascular disease 09/07-2024 S/p EVAR, bilateral femoral endarterectomy and femorofemoral bypass, as well as left iliac artery stenting.  Social history  100-pack-year history.  Quit smoking March 2024. Lives independently, has a sister that helps take care of him On nightly oxygen      Patient with CTO of the RCA that is being medically managed due to inability to cross the wire.  Has had chronic complaints of shortness of breath related to this as well as severe COPD.  He has extensive vascular history and just underwent EVAR, bilateral  femoral endarterectomy and femorofemoral bypass, as well as left iliac artery stenting 07/07/2024.  During admission had paroxysmal episodes of atrial fibrillation.  Given prior history/reoccurrence now started on Eliquis  and amiodarone .  Today patient presents for hospital follow-up.  He is asymptomatic from a atrial fibrillation perspective and unaware of this arrhythmia.  Does not note any concerns today and feels that he has done very well postoperatively.  Claudication symptoms have essentially resolved.  He is walking and completing all of his ADLs without any significant limitations.     ROS: Denies: Chest pain, shortness of breath, orthopnea, peripheral edema, palpitations, decreased exercise intolerance, fatigue, lightheadedness.   Studies Reviewed: SABRA    EKG Interpretation Date/Time:  Tuesday July 20 2024 14:56:36 EDT Ventricular Rate:  59 PR Interval:  148 QRS Duration:  90 QT Interval:  436 QTC Calculation: 431 R Axis:   22  Text Interpretation: Sinus bradycardia When compared with ECG of 09-Jul-2024 08:57, Borderline criteria for Inferior infarct are no longer Present Nonspecific T wave abnormality, improved in Lateral leads Confirmed by Darryle Currier [id number]) on 07/20/2024 3:05:15 PM    Risk Assessment/Calculations:    CHA2DS2-VASc Score = 4   This indicates a 4.8% annual risk of stroke. The patient's score is based upon: CHF History: 0 HTN History: 1 Diabetes History: 1 Stroke History: 0 Vascular Disease History: 1 Age Score: 1 Gender Score: 0   Physical Exam:   VS:  BP 120/60 (BP Location: Right Arm, Patient Position: Sitting, Cuff Size: Normal)   Pulse 68   Wt 166 lb (75.3 kg)   SpO2 90%  BMI 25.24 kg/m    Wt Readings from Last 3 Encounters:  07/20/24 166 lb (75.3 kg)  07/07/24 168 lb (76.2 kg)  07/06/24 168 lb 6.4 oz (76.4 kg)    GEN: Well nourished, well developed in no acute distress NECK: No JVD; No carotid bruits CARDIAC: RRR, no murmurs, rubs,  gallops RESPIRATORY:  Clear to auscultation without rales, wheezing or rhonchi  ABDOMEN: Soft, non-tender, non-distended EXTREMITIES:  No edema; No deformity   ASSESSMENT AND PLAN: .     PAF Has had remote episodes previously but now with paroxysmal episodes during most recent vascular procedure.  Maintaining sinus rhythm today. Continue with Eliquis  5 mg twice daily, Toprol -XL 50 mg For now we will continue amiodarone .  This was started 9/15.  Plan right now is to continue 200 mg twice daily for 14 days until 9/29 and then 200 mg daily thereafter.  He has underlying severe COPD and fairly young so long-term use is not recommended.  However in this perioperative timeframe likely not going to cause acute issues but certainly do not want to be on longer than needed.  Will discuss with his cardiologist exact time duration. We are still only about 1 week from his discharge date. Underlying CAD also limits other therapies.  QTc around 450.  Amiodarone  monitoring TSH elevated 9.8 but T4 normal in 06/2024 LFTs normal 06/2024 Has pending CT   CAD Hyperlipidemia Single-vessel disease with CTO of the RCA with failed PCI 01/2024 due to inability to cross the wire.  Denies any anginal complaints, primarily has stable shortness of breath that likely is mainly driven by COPD. On Plavix , continue Zetia  10 mg, rosuvastatin  40 mg, and Ranexa  500 mg twice daily. LDL well-controlled 38 06/2024   Diabetes A1c 6.3% 06/2024   PAD He is status post bilateral open femoral artery exposure, EVAR, fem ferm bypass and left iliac stent placement 9/10.  Further management per vascular surgery.  Progressing well and claudication symptoms much improved.  Continue Plavix  and risk reduction above  Hypertension Well-controlled.   Continue amlodipine  5 mg daily, losartan  50 mg, Toprol -XL 50 mg    Dispo: 48-month follow-up with Dr. Elmira.  Signed, Thom Manuel Sluder, PA-C

## 2024-07-20 ENCOUNTER — Ambulatory Visit: Attending: Physician Assistant | Admitting: Cardiology

## 2024-07-20 VITALS — BP 120/60 | HR 68 | Wt 166.0 lb

## 2024-07-20 DIAGNOSIS — E782 Mixed hyperlipidemia: Secondary | ICD-10-CM

## 2024-07-20 DIAGNOSIS — I10 Essential (primary) hypertension: Secondary | ICD-10-CM | POA: Diagnosis not present

## 2024-07-20 DIAGNOSIS — I480 Paroxysmal atrial fibrillation: Secondary | ICD-10-CM

## 2024-07-20 DIAGNOSIS — I739 Peripheral vascular disease, unspecified: Secondary | ICD-10-CM | POA: Diagnosis not present

## 2024-07-20 DIAGNOSIS — I25118 Atherosclerotic heart disease of native coronary artery with other forms of angina pectoris: Secondary | ICD-10-CM

## 2024-07-20 NOTE — Patient Instructions (Signed)
 Medication Instructions:  Your physician recommends that you continue on your current medications as directed. Please refer to the Current Medication list given to you today.  *If you need a refill on your cardiac medications before your next appointment, please call your pharmacy*   Follow-Up: At [facility], you and your health needs are our priority.  As part of our continuing mission to provide you with exceptional heart care, our providers are all part of one team.  This team includes your primary Cardiologist (physician) and Advanced Practice Providers or APPs (Physician Assistants and Nurse Practitioners) who all work together to provide you with the care you need, when you need it.  Your next appointment:   4 month(s)  Provider:   Newman JINNY Lawrence, MD

## 2024-07-21 ENCOUNTER — Ambulatory Visit: Attending: Cardiology

## 2024-07-21 ENCOUNTER — Telehealth: Payer: Self-pay | Admitting: Cardiology

## 2024-07-21 DIAGNOSIS — I480 Paroxysmal atrial fibrillation: Secondary | ICD-10-CM

## 2024-07-21 NOTE — Telephone Encounter (Signed)
 Haley, Sheng L, PA-C    07/21/24 10:15 AM Note Hi guys can you please reach out to the patient and to tell him to stop his amiodarone  completely on 9/29, but continue current titration until then?  Please let him know that we will order 2-week heart monitor to assess recurrence of atrial fibrillation AFTER he finishes his amio load.  As long as this does not show any atrial fibrillation we will not plan on amiodarone  chronically.   Shelly-Can you please order 2-week nonlive heart monitor for atrial fibrillation AFTER 09/29.  Dr. Elmira wanted to read.       S/w Joen (dpr) gave information above and also sent information and Zio monitor instructions over MyChart. She verbalized understanding

## 2024-07-21 NOTE — Progress Notes (Unsigned)
Enrolled for [employer] to mail a ZIO XT long term holter monitor to the patients address on file.   Dr. Rosemary Holms to read.

## 2024-07-21 NOTE — Telephone Encounter (Signed)
 Hi guys can you please reach out to the patient and to tell him to stop his amiodarone  completely on 9/29, but continue current titration until then?  Please let him know that we will order 2-week heart monitor to assess recurrence of atrial fibrillation AFTER he finishes his amio load.  As long as this does not show any atrial fibrillation we will not plan on amiodarone  chronically.  Shelly-Can you please order 2-week nonlive heart monitor for atrial fibrillation AFTER 09/29.  Dr. Elmira wanted to read.

## 2024-08-02 ENCOUNTER — Ambulatory Visit: Admitting: Surgery

## 2024-08-02 ENCOUNTER — Encounter (HOSPITAL_COMMUNITY)

## 2024-08-02 ENCOUNTER — Other Ambulatory Visit: Payer: Self-pay

## 2024-08-02 DIAGNOSIS — I7143 Infrarenal abdominal aortic aneurysm, without rupture: Secondary | ICD-10-CM

## 2024-08-02 DIAGNOSIS — I70213 Atherosclerosis of native arteries of extremities with intermittent claudication, bilateral legs: Secondary | ICD-10-CM

## 2024-08-04 ENCOUNTER — Other Ambulatory Visit: Payer: Self-pay | Admitting: Cardiology

## 2024-08-10 ENCOUNTER — Ambulatory Visit (HOSPITAL_BASED_OUTPATIENT_CLINIC_OR_DEPARTMENT_OTHER)
Admission: RE | Admit: 2024-08-10 | Discharge: 2024-08-10 | Disposition: A | Discharge status: Home / Self Care | Source: Ambulatory Visit | Attending: Surgery | Admitting: Surgery

## 2024-08-10 ENCOUNTER — Telehealth: Payer: Self-pay

## 2024-08-10 DIAGNOSIS — I70213 Atherosclerosis of native arteries of extremities with intermittent claudication, bilateral legs: Secondary | ICD-10-CM | POA: Insufficient documentation

## 2024-08-10 DIAGNOSIS — I7143 Infrarenal abdominal aortic aneurysm, without rupture: Secondary | ICD-10-CM | POA: Insufficient documentation

## 2024-08-10 DIAGNOSIS — I701 Atherosclerosis of renal artery: Secondary | ICD-10-CM | POA: Diagnosis not present

## 2024-08-10 DIAGNOSIS — I70212 Atherosclerosis of native arteries of extremities with intermittent claudication, left leg: Secondary | ICD-10-CM | POA: Diagnosis not present

## 2024-08-10 MED ORDER — IOHEXOL 350 MG/ML SOLN
100.0000 mL | Freq: Once | INTRAVENOUS | Status: AC | PRN
  Administered 2024-08-10: 100 mL via INTRAVENOUS

## 2024-08-10 NOTE — Telephone Encounter (Signed)
 Spoke with pt about drainage from his right groin incision that started today s/p EVAR on 07/07/24. Pt reported clear drainage, no knots or swelling at the groin site, no signs of infection and not painful.  Pt advised to keep incision clean and dry. Pt advised to place clean guaze in his undergarment to collect the fluid and to change the guaze as if becomes wet or soiled.  Pt knows to go to the ED should there be a rapid increase in the drainage or swelling around/under the incision. Pt knows to call EMS if the incision begins to bleed. Pt knows to call [facility] if signs of infection develop.

## 2024-08-11 ENCOUNTER — Other Ambulatory Visit: Payer: Self-pay

## 2024-08-11 MED ORDER — APIXABAN 5 MG PO TABS: 5.0000 mg | ORAL_TABLET | Freq: Two times a day (BID) | ORAL | 0 refills | Status: DC

## 2024-08-16 DIAGNOSIS — I480 Paroxysmal atrial fibrillation: Secondary | ICD-10-CM | POA: Diagnosis not present

## 2024-08-16 DIAGNOSIS — J449 Chronic obstructive pulmonary disease, unspecified: Secondary | ICD-10-CM | POA: Diagnosis not present

## 2024-08-16 DIAGNOSIS — J439 Emphysema, unspecified: Secondary | ICD-10-CM | POA: Diagnosis not present

## 2024-08-19 ENCOUNTER — Ambulatory Visit: Admitting: Pulmonary Disease

## 2024-08-19 ENCOUNTER — Encounter: Payer: Self-pay | Admitting: Pulmonary Disease

## 2024-08-19 VITALS — BP 136/90 | HR 80 | Temp 98.1°F | Ht 68.0 in | Wt 166.6 lb

## 2024-08-19 DIAGNOSIS — I240 Acute coronary thrombosis not resulting in myocardial infarction: Secondary | ICD-10-CM

## 2024-08-19 DIAGNOSIS — J439 Emphysema, unspecified: Secondary | ICD-10-CM

## 2024-08-19 DIAGNOSIS — R0609 Other forms of dyspnea: Secondary | ICD-10-CM | POA: Diagnosis not present

## 2024-08-19 DIAGNOSIS — J449 Chronic obstructive pulmonary disease, unspecified: Secondary | ICD-10-CM | POA: Diagnosis not present

## 2024-08-19 DIAGNOSIS — G4736 Sleep related hypoventilation in conditions classified elsewhere: Secondary | ICD-10-CM

## 2024-08-19 DIAGNOSIS — I70213 Atherosclerosis of native arteries of extremities with intermittent claudication, bilateral legs: Secondary | ICD-10-CM

## 2024-08-19 MED ORDER — BREZTRI AEROSPHERE 160-9-4.8 MCG/ACT IN AERO: 2.0000 | INHALATION_SPRAY | Freq: Two times a day (BID) | RESPIRATORY_TRACT | 6 refills | Status: DC

## 2024-08-19 MED ORDER — BREZTRI AEROSPHERE 160-9-4.8 MCG/ACT IN AERO: 2.0000 | INHALATION_SPRAY | Freq: Two times a day (BID) | RESPIRATORY_TRACT | 0 refills | Status: DC

## 2024-08-19 NOTE — Patient Instructions (Signed)
 VISIT SUMMARY:  Today, we discussed your inhaler use and leg pain. You mentioned that you ran out of your inhaler two days ago but have refills available. You also reported no leg pain since your recent procedure. Additionally, you have not yet received a flu shot.  YOUR PLAN:  -CHRONIC OBSTRUCTIVE PULMONARY DISEASE (COPD): COPD is a chronic lung condition that makes it hard to breathe. You ran out of your inhaler, but you will receive samples to start using immediately. You should continue using oxygen  at night as it helps your condition. No wheezing was noted during your exam, which is a good sign. Please ensure you fill your inhaler prescription and continue using it as directed. We will schedule a follow-up appointment in three months.  -LEG PAIN: You had a procedure on your legs six weeks ago and have reported no pain since then. This is a positive outcome, and you are scheduled to see your vascular surgeon in a couple of weeks for further evaluation.  INSTRUCTIONS:  Please fill your inhaler prescription as soon as possible and continue using it as directed. Continue using oxygen  at night. Schedule a follow-up appointment in three months for your COPD. You are also scheduled to see your vascular surgeon in a couple of weeks, possibly on November 3rd. Additionally, consider getting a flu shot.

## 2024-08-19 NOTE — Progress Notes (Signed)
 Subjective:    Patient ID: Manuel Richardson, male    DOB: Oct 04, 1956, 68 y.o.   MRN: [id number]  Patient Care Team: Valentin Skates, DO as PCP - General (Internal Medicine) Elmira Newman PARAS, MD as PCP - Cardiology (Cardiology) Serene Gaile ORN, MD as Consulting Physician (Vascular Surgery)  Chief Complaint  Patient presents with   COPD    BACKGROUND/INTERVAL:Patient is a 68 year old former smoker with 100-pack-year history of smoking and a history as noted below who presents for follow-up of dyspnea on exertion and fatigue.  Initially noted in May 2022 after COVID-19 infection.  Previously evaluated by Dr. Vicenta Lennert in August 2023 and no follow-up after that.  Patient has been on Breztri  and albuterol  for his dyspnea with only partial relief of symptoms.  Recently had left heart cath due to high risk cardiac PET/CT.  He has complete chronic occlusion of the RCA but it could not be intervened on due to noted to cross with a wire.  He is on medical therapy.  He continues to complain of severe dyspnea.  He was only able to complete 10 sessions of pulmonary rehab due to severe claudication.  He presents for follow-up in the interim he had aorto uni iliac stent grafting and femoro-femoral bypass performed on 07 July 2024 by Dr. Gaile Serene.   HPI Discussed the use of AI scribe software for clinical note transcription with the patient, who gave verbal consent to proceed.  History of Present Illness   Manuel Richardson is a 68 year old male who presents for a follow-up regarding his COPD and leg pain.  He ran out of his inhaler two days ago and initially thought he needed to see someone to get more. He was informed that he had about six more refills available.  This is Breztri .  He is currently not using his inhaler but plans to fill it soon. He continues to use oxygen  at nighttime.  He notes benefit of the therapy.  He does note that when he uses the Breztri  his shortness of breath is  markedly improved.  He underwent a procedure on his legs six weeks ago and reports no pain since then. Initially, he thought the pain was in his hip, but now he feels good and has no pain in his leg. He is scheduled to see the vascular surgeon again in a couple of weeks, possibly on November 3rd.  He was instructed to get clearance so that he can go back to pulmonary rehab which she had to discontinue due to claudication.  He has not received a flu shot and declines it.  He does not endorse any other symptomatology.  Overall he feels well and looks well.     DATA 09/21/2018 spirometry: FEV1 1.70 L or 53% predicted, FVC 2.86 L or 66% predicted, FEV1/FVC 59%.  Consistent with moderate obstruction. 05/30/2022 spirometry: FEV1 1.49 L or 46% predicted, FVC 3.07 L or 70% predicted, FEV1/FVC 48%.  Consistent with moderate to severe obstruction. 10/09/2023 echocardiogram: LVEF 55 to 60%, normal LV function, mild left ventricular hypertrophy, grade 1 DD.  RV systolic function normal.  Revealed mitral valve regurgitation.  No mitral stenosis.  No aortic valve abnormalities. 11/05/2023 PFTs: FEV1 1.57 L or 48% predicted, FVC 3.23 L or 73% of predicted, FEV1/FVC 48%, no significant bronchodilator response, volumes show air trapping and mild hyperinflation, diffusion capacity is severely decreased.  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 01/01/2024 cardiac PET/CT: EF noted to be  33 to 36%.  Study was high risk for ischemia. 01/16/2024 left heart cath: RCA with proximal 99% stenosis followed by chronic total occlusion.  Left-to-right collaterals from LAD septal branches feeding distal RCA and beyond.  No other significant stenosis noted.  PCI could not be done at that time. 01/29/2024 coronary CTO intervention: RCA to mid RCA lesion 100% stenosed.  Unsuccessful CTO PCI of the RCA.  Unable to cross with a wire. 02/18/2024 alpha-1 antitrypsin: Phenotype MS, level 145 mg/dL.  Review of Systems A 10  point review of systems was performed and it is as noted above otherwise negative.   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 07/07/2024   AAA (abdominal aortic aneurysm) 07/07/2024   Angina pectoris 01/29/2024   Pre-op evaluation 01/05/2024   Coronary artery calcification 11/10/2023   Exertional dyspnea 11/04/2023   Atrial fibrillation with RVR (HCC) 03/28/2021   COVID-19 virus infection 03/28/2021   PAD (peripheral artery disease) 09/21/2018   Bilateral carotid bruits 09/21/2018   Tobacco use disorder 07/18/2014   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/26/2012   Coronary artery disease 05/26/2012   ED (erectile dysfunction) 03/30/2012   Primary hypertension 03/30/2012   Stage 3 severe COPD by GOLD classification (HCC) 03/30/2012   Annual physical exam 03/05/2011   Hyperlipidemia 03/05/2011   Carotid artery disease 03/05/2011    Social History   Tobacco Use   Smoking status: Former    Current packs/day: 0.00    Average packs/day: 3.0 packs/day for 54.0 years (162.0 ttl pk-yrs)    Types: Cigarettes    Start date: 19    Quit date: 2024    Years since quitting: 1.8   Smokeless tobacco: Never   Tobacco comments:    Quit 12/28/2022 khj 09/17/2023    Smoked for 53 years.     Smoked 3 PPD at his heaviest  Substance Use Topics   Alcohol use: Not Currently    Comment: occasionally    No Known Allergies  Current Meds  Medication Sig   albuterol  (VENTOLIN  HFA) 108 (90 Base) MCG/ACT inhaler Inhale 2 puffs into the lungs every 6 (six) hours as needed for wheezing or shortness of breath.   amLODipine  (NORVASC ) 5 MG tablet Take 1 tablet (5 mg total) by mouth daily.   apixaban  (ELIQUIS ) 5 MG TABS tablet Take 1 tablet (5 mg total) by mouth 2 (two) times daily.   clopidogrel  (PLAVIX ) 75 MG tablet Take 1 tablet (75 mg total) by mouth daily.   ezetimibe  (ZETIA ) 10 MG tablet Take 1 tablet (10  mg total) by mouth daily.   losartan  (COZAAR ) 50 MG tablet Take 1 tablet (50 mg total) by mouth daily.   metoprolol  succinate (TOPROL -XL) 50 MG 24 hr tablet TAKE 1 TABLET BY MOUTH DAILY. TAKE WITH OR IMMEDIATELY FOLLOWING A MEAL.   nitroGLYCERIN  (NITROSTAT ) 0.4 MG SL tablet Place 1 tablet (0.4 mg total) under the tongue every 5 (five) minutes as needed for chest pain.   ranolazine  (RANEXA ) 500 MG 12 hr tablet Take 1 tablet (500 mg total) by mouth 2 (two) times daily.   rosuvastatin  (CRESTOR ) 40 MG tablet Take 1 tablet (40 mg total) by mouth daily.   [DISCONTINUED] BREZTRI  AEROSPHERE 160-9-4.8 MCG/ACT AERO inhaler Inhale 2 puffs into the lungs 2 (two) times daily.    Immunization History  Administered Date(s) Administered   Influenza,inj,Quad PF,6+ Mos 07/18/2014   PNEUMOCOCCAL CONJUGATE-20 03/28/2023  Pneumococcal Polysaccharide-23 03/05/2011, 03/28/2023   Tdap 03/05/2011, 06/04/2021        Objective:     BP (!) 136/90   Pulse 80   Temp 98.1 F (36.7 C) (Temporal)   Ht 5' 8 (1.727 m)   Wt 166 lb 9.6 oz (75.6 kg)   SpO2 96%   BMI 25.33 kg/m   SpO2: 96 %  GENERAL: Awake, alert, fully ambulatory.  No use of accessory use noted.  No conversational dyspnea. HEAD: Normocephalic, atraumatic.  EYES: Pupils equal, round, reactive to light.  No scleral icterus.  MOUTH: Few chipped teeth, oral mucosa moist.  Pharynx clear. NECK: Supple. No thyromegaly. Trachea midline. No JVD.  No adenopathy. PULMONARY: Distant breath sounds bilaterally.  Coarse, no adventitious sounds otherwise. CARDIOVASCULAR: S1 and S2. Regular rate and rhythm.  No rubs, murmurs or gallops heard. ABDOMEN: Mild truncal obesity. MUSCULOSKELETAL: No joint deformity, no clubbing, no edema.  NEUROLOGIC: No overt focal deficit, no gait disturbance, speech is fluent. SKIN: Intact,warm,dry. PSYCH: Mood and behavior normal    Assessment & Plan:     ICD-10-CM   1. Stage 3 severe COPD by GOLD classification (HCC)   J44.9     2. Dyspnea on exertion  R06.09     3. RCA occlusion (HCC)  I24.0     4. Nocturnal hypoxemia due to emphysema (HCC)  J43.9    G47.36     5. Atherosclerosis of native artery of both lower extremities with intermittent claudication  I70.213       Meds ordered this encounter  Medications   BREZTRI  AEROSPHERE 160-9-4.8 MCG/ACT AERO inhaler    Sig: Inhale 2 puffs into the lungs 2 (two) times daily.    Dispense:  10.7 g    Refill:  6   budesonide -glycopyrrolate -formoterol  (BREZTRI  AEROSPHERE) 160-9-4.8 MCG/ACT AERO inhaler    Sig: Inhale 2 puffs into the lungs in the morning and at bedtime.    Dispense:  1 each    Refill:  0   Discussion:    Chronic obstructive pulmonary disease (COPD) COPD management is ongoing. He ran out of his inhaler two days ago but will receive samples to start using immediately. He is using oxygen  at nighttime, which is beneficial for his condition. No wheezing was noted upon examination, indicating well-managed respiratory status. - Provide inhaler samples for immediate use - Ensure ongoing prescription for inhaler refills - Continue nighttime oxygen  use - Schedule follow-up appointment in three months     Peripheral vascular disease both lower extremities Had aorto uni iliac stent grafting and femoro-femoral bypass performed on 07 July 2024, had no issues postoperatively.  Has noted marked improvement in pain since procedure. 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 - Continue nocturnal oxygen  supplementation at 2 L/min during sleep   Advised if symptoms do not improve or worsen, to please contact office for sooner follow up or seek emergency care.    I spent 35 minutes of dedicated to the care of this patient on the date  of this encounter to include pre-visit review of records, face-to-face time with the patient discussing conditions above, post visit ordering of testing, clinical documentation with the electronic health record, making appropriate referrals as documented, and communicating necessary findings to members of the patients care team.     C. Leita Sanders,  MD Advanced Bronchoscopy P[facility]    *This note was generated using voice recognition software/Dragon and/or AI transcription program.  Despite best efforts to proofread, errors can occur which can change the meaning. Any transcriptional errors that result from this process are unintentional and may not be fully corrected at the time of dictation.

## 2024-08-27 DIAGNOSIS — I480 Paroxysmal atrial fibrillation: Secondary | ICD-10-CM

## 2024-08-30 ENCOUNTER — Encounter: Payer: Self-pay | Admitting: Surgery

## 2024-08-30 ENCOUNTER — Encounter: Payer: Self-pay | Admitting: Cardiology

## 2024-08-30 ENCOUNTER — Ambulatory Visit: Attending: Surgery | Admitting: Surgery

## 2024-08-30 VITALS — BP 133/83 | HR 68 | Temp 98.1°F | Ht 68.0 in | Wt 165.0 lb

## 2024-08-30 DIAGNOSIS — I7143 Infrarenal abdominal aortic aneurysm, without rupture: Secondary | ICD-10-CM

## 2024-08-30 NOTE — Progress Notes (Signed)
 [facility] [place] [facility]  Patient name: Manuel Richardson MRN: [id number] DOB: 1956-05-02 Sex: male   REASON FOR VISIT:    Follow-up  HISOTRY OF PRESENT ILLNESS:    Manuel Richardson is a 68 y.o. male, who is referred for evaluation of right leg claudication.  He has a history of left carotid subclavian bypass graft by myself in 2012 for left arm claudication which has resolved.  He states that he gets very short distance right leg claudication at approximately 30 feet.  However, most of his walking is limited by his pulmonary disease.  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  I felt that the best option to treat his disease would be a AUI with left to right femoral-femoral bypass graft.  This was performed on 07/07/2024.  He is back today for follow-up.  He is walking much better now and has no complaints     Patient is medically managed for hypertension with an ARB.  He has prediabetes.  He takes a statin for hypercholesterolemia.  He has a prior history of A-fib.  He is not interested in anticoagulation he has known coronary artery disease, status post unsuccessful attempt at treating a RCA occlusion he has exertional dyspnea.  He is a former smoker.  He is on oxygen  most of the day.   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   s/p left CCA-SCA bypass 7 mm Dacron   Tobacco use disorder      FAMILY HISTORY:   Family History  Problem Relation Age of Onset   Heart disease Mother        died  40 with MI   Cancer Father        lung cancer   Asthma Father    Lung cancer Father        smoked   Hypertension Brother    Diabetes Neg Hx    Stroke Neg Hx     SOCIAL HISTORY:   Social History   Tobacco Use   Smoking status: Former    Current packs/day: 0.00    Average packs/day: 3.0 packs/day for 54.0 years (162.0 ttl pk-yrs)    Types: Cigarettes    Start date: 17    Quit date: 2024    Years since quitting: 1.8   Smokeless tobacco: Never   Tobacco comments:    Quit 12/28/2022 khj 09/17/2023    Smoked for 53 years.     Smoked 3 PPD at his heaviest  Substance Use Topics   Alcohol use: Not Currently    Comment: occasionally     ALLERGIES:   No Known Allergies   CURRENT MEDICATIONS:   Current Outpatient Medications  Medication Sig Dispense Refill   albuterol  (VENTOLIN  HFA) 108 (90 Base) MCG/ACT inhaler Inhale 2 puffs into the lungs every 6 (six) hours as needed for wheezing or shortness of breath. 8 g 2   amLODipine  (NORVASC ) 5 MG tablet Take 1 tablet (5  mg total) by mouth daily. 90 tablet 3   apixaban  (ELIQUIS ) 5 MG TABS tablet Take 1 tablet (5 mg total) by mouth 2 (two) times daily. 60 tablet 0   BREZTRI  AEROSPHERE 160-9-4.8 MCG/ACT AERO inhaler Inhale 2 puffs into the lungs 2 (two) times daily. 10.7 g 6   budesonide -glycopyrrolate -formoterol  (BREZTRI  AEROSPHERE) 160-9-4.8 MCG/ACT AERO inhaler Inhale 2 puffs into the lungs in the morning and at bedtime. 1 each 0   clopidogrel  (PLAVIX ) 75 MG tablet Take 1 tablet (75 mg total) by mouth daily. 90 tablet 3   ezetimibe  (ZETIA ) 10 MG tablet Take 1 tablet (10 mg total) by mouth daily. 90 tablet 3   losartan  (COZAAR ) 50 MG tablet Take 1 tablet (50 mg total) by mouth daily. 90 tablet 3   metoprolol  succinate (TOPROL -XL) 50 MG 24 hr tablet TAKE 1 TABLET BY MOUTH DAILY. TAKE WITH OR IMMEDIATELY FOLLOWING A MEAL. 90 tablet 3   nitroGLYCERIN  (NITROSTAT ) 0.4 MG SL tablet Place 1 tablet (0.4 mg total) under the tongue every 5  (five) minutes as needed for chest pain. 25 tablet 3   ranolazine  (RANEXA ) 500 MG 12 hr tablet Take 1 tablet (500 mg total) by mouth 2 (two) times daily. 180 tablet 2   rosuvastatin  (CRESTOR ) 40 MG tablet Take 1 tablet (40 mg total) by mouth daily. 90 tablet 3   No current facility-administered medications for this visit.    REVIEW OF SYSTEMS:   [X]  denotes positive finding, [ ]  denotes negative finding Cardiac  Comments:  Chest pain or chest pressure:    Shortness of breath upon exertion:    Short of breath when lying flat:    Irregular heart rhythm:        Vascular    Pain in calf, thigh, or hip brought on by ambulation:    Pain in feet at night that wakes you up from your sleep:     Blood clot in your veins:    Leg swelling:         Pulmonary    Oxygen  at home:    Productive cough:     Wheezing:         Neurologic    Sudden weakness in arms or legs:     Sudden numbness in arms or legs:     Sudden onset of difficulty speaking or slurred speech:    Temporary loss of vision in one eye:     Problems with dizziness:         Gastrointestinal    Blood in stool:     Vomited blood:         Genitourinary    Burning when urinating:     Blood in urine:        Psychiatric    Major depression:         Hematologic    Bleeding problems:    Problems with blood clotting too easily:        Skin    Rashes or ulcers:        Constitutional    Fever or chills:      PHYSICAL EXAM:   There were no vitals filed for this visit.  GENERAL: The patient is a well-nourished male, in no acute distress. The vital signs are documented above. CARDIAC: There is a regular rate and rhythm.  VASCULAR: Groin incisions have healed nicely.  Palpable right posterior tibial pulse PULMONARY: Non-labored respirations ABDOMEN: Soft and non-tender  MUSCULOSKELETAL: There are no major deformities or  cyanosis. NEUROLOGIC: No focal weakness or paresthesias are detected. SKIN: There are no ulcers or  rashes noted. PSYCHIATRIC: The patient has a normal affect.  STUDIES:   I have reviewed the following CTA: VASCULAR   1. Interval left right fem-fem bypass graft appears widely patent without evidence of complication at either anastomosis. Fresh bilateral femoral cutdown incisions are noted as well as a small amount of fluid tracking along the graft which likely represents postoperative hematoma. Findings are likely within normal given the recent postoperative state. However, if there are clinical signs of infection, this could have a similar imaging appearance. 2. Remote prior aortouniiliac repair of excluded infrarenal abdominal aortic aneurysm without evidence of complication. 3. Chronic occlusion of the right common, internal and external iliac arteries. 4. Moderate to high-grade stenosis of the left P1 and P2 segments of the popliteal artery due to heavily calcified atherosclerotic plaque. 5. Moderate stenosis of the proximal SMA. 6. Moderate to high-grade stenosis of the proximal left renal artery.   NON-VASCULAR   1. Advanced centrilobular pulmonary emphysema and diffuse mild bronchial wall thickening suggesting superimposed chronic bronchitis.  MEDICAL ISSUES:   Carotid: Known bilateral occlusion  PAD/AAA: Status post AUI and femorofemoral.  CT scan shows no evidence of endoleak and widely patent bypass graft.  He will return in 6 months for surveillance imaging    Malvina New, IV, MD, FACS [facility] [phone] Pager [phone]

## 2024-08-31 ENCOUNTER — Ambulatory Visit: Payer: Self-pay | Admitting: Cardiology

## 2024-09-02 ENCOUNTER — Other Ambulatory Visit: Payer: Self-pay | Admitting: *Deleted

## 2024-09-02 DIAGNOSIS — Z9889 Other specified postprocedural states: Secondary | ICD-10-CM

## 2024-09-02 DIAGNOSIS — I739 Peripheral vascular disease, unspecified: Secondary | ICD-10-CM

## 2024-09-02 DIAGNOSIS — I70213 Atherosclerosis of native arteries of extremities with intermittent claudication, bilateral legs: Secondary | ICD-10-CM

## 2024-09-16 ENCOUNTER — Other Ambulatory Visit: Payer: Self-pay | Admitting: Cardiology

## 2024-09-16 DIAGNOSIS — J449 Chronic obstructive pulmonary disease, unspecified: Secondary | ICD-10-CM | POA: Diagnosis not present

## 2024-09-16 DIAGNOSIS — J439 Emphysema, unspecified: Secondary | ICD-10-CM | POA: Diagnosis not present

## 2024-09-27 ENCOUNTER — Telehealth: Payer: Self-pay

## 2024-09-27 NOTE — Telephone Encounter (Signed)
 Patient called to report bilateral groin incisions with quarter-sized knots that started about two weeks ago, no pain, no sign of infection.  Appt made for 09/28/24

## 2024-09-28 ENCOUNTER — Ambulatory Visit: Attending: Vascular Surgery

## 2024-09-28 VITALS — BP 122/78 | HR 68 | Temp 97.7°F | Wt 168.6 lb

## 2024-09-28 DIAGNOSIS — I70213 Atherosclerosis of native arteries of extremities with intermittent claudication, bilateral legs: Secondary | ICD-10-CM

## 2024-09-28 DIAGNOSIS — Z9889 Other specified postprocedural states: Secondary | ICD-10-CM

## 2024-09-28 MED ORDER — DOXYCYCLINE HYCLATE 100 MG PO CAPS: 100.0000 mg | ORAL_CAPSULE | Freq: Two times a day (BID) | ORAL | 0 refills | Status: AC

## 2024-09-28 NOTE — Progress Notes (Signed)
 Office Note     CC:  follow up Requesting Provider:  Valentin Skates, DO  HPI: Manuel Richardson is a 68 y.o. (September 26, 1956) male who was added on to clinic as an urgent triage patient due to fluid collection in both groins.  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  This was performed by Dr. Serene on 07/07/2024.  CT scan a month postoperatively showed a small fluid collection in the right groin.  He returns today with fullness in both groins but mainly the right.  He has had drainage from the right groin however this has resolved.  Drainage was thin and yellow in nature.  He denies fevers, chills, nausea/vomiting.  He denies rest pain or tissue loss in the feet.  He is on Eliquis , plavix , and statin daily.   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   s/p left CCA-SCA bypass 7 mm Dacron   Tobacco use disorder     Past Surgical History:  Procedure Laterality Date   ABDOMINAL AORTIC ENDOVASCULAR STENT GRAFT N/A 07/07/2024   Procedure: INSERTION, ENDOVASCULAR STENT GRAFT, AORTA, aneurysms repair;  Surgeon: Serene Gaile ORN, MD;  Location: [facility] OR;  Service: Vascular;  Laterality: N/A;   carotid artery surgery  2012   stent in left side   COLONOSCOPY  2007   CORONARY CTO INTERVENTION N/A 01/29/2024   Procedure: CORONARY CTO INTERVENTION;  Surgeon: Jordan, Peter M, MD;  Location: [facility];  Service: Cardiovascular;  Laterality: N/A;   ELBOW SURGERY  teenager   left   ENDARTERECTOMY FEMORAL Bilateral 07/07/2024   Procedure: Bilateral ENDARTERECTOMY, FEMORAL;   Surgeon: Serene Gaile ORN, MD;  Location: [facility] OR;  Service: Vascular;  Laterality: Bilateral;   FEMORAL-FEMORAL BYPASS GRAFT Bilateral 07/07/2024   Procedure: CREATION, BYPASS, ARTERIAL, FEMORAL TO FEMORAL, USING GRAFT;  Surgeon: Serene Gaile ORN, MD;  Location: [facility] OR;  Service: Vascular;  Laterality: Bilateral;   LEFT HEART CATH AND CORONARY ANGIOGRAPHY N/A 01/16/2024   Procedure: LEFT HEART CATH AND CORONARY ANGIOGRAPHY;  Surgeon: Elmira Newman PARAS, MD;  Location: [facility];  Service: Cardiovascular;  Laterality: N/A;   PATCH ANGIOPLASTY Left 07/07/2024   Procedure: ANGIOPLASTY, USING PATCH GRAFT;  Surgeon: Serene Gaile ORN, MD;  Location: [facility] OR;  Service: Vascular;  Laterality: Left;    Social History   Socioeconomic History   Marital status: Single    Spouse name: Not on file   Number of children: 2   Years of education: Not on file   Highest education level: Not on file  Occupational History   Occupation: FOREMAN    Employer: [employer]    Comment: HVAC, 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     Smoked 3 PPD at his heaviest  Vaping Use   Vaping status: Never Used  Substance and Sexual Activity   Alcohol use: Not Currently    Comment: occasionally   Drug use: No   Sexual activity: Not Currently  Other Topics Concern   Not on file  Social History Narrative   Married, 1 daughter in [place], exercise - walking on job   Social Drivers of Corporate Investment Banker Strain: Not on file  Food Insecurity: No Food Insecurity (07/08/2024)   Hunger Vital Sign    Worried About Running Out of Food in the Last Year: Never true    Ran Out of Food in the Last Year:  Never true  Transportation Needs: No Transportation Needs (07/08/2024)   PRAPARE - Administrator, Civil Service (Medical): No    Lack of Transportation (Non-Medical): No  Physical Activity: Not on file  Stress: Not on file  Social Connections: Unknown (07/08/2024)   Social Connection and Isolation Panel    Frequency of Communication with Friends and Family: More than three times a week    Frequency of Social Gatherings with Friends and Family: More than three times a week    Attends Religious Services: 1 to 4 times per year    Active Member of [employer] or Organizations: Yes    Attends Banker Meetings: 1 to 4 times per year    Marital Status: Patient declined  Intimate Partner Violence: Not At Risk (07/08/2024)   Humiliation, Afraid, Rape, and Kick questionnaire    Fear of Current or Ex-Partner: No    Emotionally Abused: No    Physically Abused: No    Sexually Abused: No    Family History  Problem Relation Age of Onset   Heart disease Mother        died 26 with MI   Cancer Father        lung cancer   Asthma Father    Lung cancer Father        smoked   Hypertension Brother    Diabetes Neg Hx    Stroke Neg Hx     Current Outpatient Medications  Medication Sig Dispense Refill   albuterol  (VENTOLIN  HFA) 108 (90 Base) MCG/ACT inhaler Inhale 2 puffs into the lungs every 6 (six) hours as needed for wheezing or shortness of breath. 8 g 2   amLODipine  (NORVASC ) 5 MG tablet Take 1 tablet (5 mg total) by mouth daily. 90 tablet 3   apixaban  (ELIQUIS ) 5 MG TABS tablet Take 1 tablet (5 mg total) by mouth 2 (two) times daily. 60 tablet 0   BREZTRI  AEROSPHERE 160-9-4.8 MCG/ACT AERO inhaler Inhale 2 puffs into the lungs 2 (two) times daily. 10.7 g 6   budesonide -glycopyrrolate -formoterol  (BREZTRI  AEROSPHERE) 160-9-4.8 MCG/ACT AERO inhaler Inhale 2 puffs into the lungs in the morning and at bedtime. 1 each 0   clopidogrel  (PLAVIX ) 75 MG tablet Take 1 tablet (75 mg total)  by mouth daily. 90 tablet 3   ezetimibe  (ZETIA ) 10 MG tablet Take 1 tablet (10 mg total) by mouth daily. 90 tablet 3   losartan  (COZAAR ) 50 MG tablet TAKE 1 TABLET BY MOUTH EVERY DAY 90 tablet 3   metoprolol  succinate (TOPROL -XL) 50 MG 24 hr tablet TAKE 1 TABLET BY MOUTH DAILY. TAKE WITH OR IMMEDIATELY FOLLOWING A MEAL. 90 tablet 3  nitroGLYCERIN  (NITROSTAT ) 0.4 MG SL tablet Place 1 tablet (0.4 mg total) under the tongue every 5 (five) minutes as needed for chest pain. 25 tablet 3   ranolazine  (RANEXA ) 500 MG 12 hr tablet Take 1 tablet (500 mg total) by mouth 2 (two) times daily. 180 tablet 2   rosuvastatin  (CRESTOR ) 40 MG tablet Take 1 tablet (40 mg total) by mouth daily. 90 tablet 3   No current facility-administered medications for this visit.    No Known Allergies   REVIEW OF SYSTEMS:  Negative unless noted in HPI [X]  denotes positive finding, [ ]  denotes negative finding Cardiac  Comments:  Chest pain or chest pressure:    Shortness of breath upon exertion:    Short of breath when lying flat:    Irregular heart rhythm:        Vascular    Pain in calf, thigh, or hip brought on by ambulation:    Pain in feet at night that wakes you up from your sleep:     Blood clot in your veins:    Leg swelling:         Pulmonary    Oxygen  at home:    Productive cough:     Wheezing:         Neurologic    Sudden weakness in arms or legs:     Sudden numbness in arms or legs:     Sudden onset of difficulty speaking or slurred speech:    Temporary loss of vision in one eye:     Problems with dizziness:         Gastrointestinal    Blood in stool:     Vomited blood:         Genitourinary    Burning when urinating:     Blood in urine:        Psychiatric    Major depression:         Hematologic    Bleeding problems:    Problems with blood clotting too easily:        Skin    Rashes or ulcers:        Constitutional    Fever or chills:      PHYSICAL EXAMINATION:  Vitals:    09/28/24 1311  BP: 122/78  Pulse: 68  Temp: 97.7 F (36.5 C)  TempSrc: Temporal  Weight: 168 lb 9.6 oz (76.5 kg)    General:  WDWN in NAD; vital signs documented above Gait: Not observed HENT: WNL, normocephalic Pulmonary: normal non-labored breathing Cardiac: regular HR Abdomen: soft, NT, no masses Skin: without rashes Vascular Exam/Pulses: Palpable right PT pulse; brisk PT and DP by Doppler left lower extremity Extremities: Left groin mild fullness but soft; incision completely healed; right groin with fluid collection pictured below; pulsatile flow through the femorofemoral bypass without any palpable areas of fluctuance along the tunneled bypass Musculoskeletal: no muscle wasting or atrophy  Neurologic: A&O X 3 Psychiatric:  The pt has Normal affect.       ASSESSMENT/PLAN:: 68 y.o. 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  Bilateral lower extremities are well-perfused on exam.  Left groin incision has completely healed except for some fullness.  The right groin incision developed drainage postoperatively  which has since resolved.  He now has a pimple-like protrusion from the incision.  Fluid collection seems to be resolving however the concern is for an underlying infection of the femorofemoral bypass.  Case was discussed with Dr. Serene.  The plan will be to attempt to manage this conservatively.  He will be started on antibiotics.  He will return to the office in 1 to 2 weeks for a wound check.  We discussed the possibility of returning to the operating room for wound washout with the patient.   Donnice Sender, PA-C [facility] [phone]  Clinic MD:   [place]

## 2024-09-30 ENCOUNTER — Telehealth: Payer: Self-pay | Admitting: Cardiology

## 2024-09-30 MED ORDER — APIXABAN 5 MG PO TABS: 5.0000 mg | ORAL_TABLET | Freq: Two times a day (BID) | ORAL | 1 refills | Status: AC

## 2024-09-30 NOTE — Telephone Encounter (Signed)
 Pt sister asking if pt needs to continue apixaban  (ELIQUIS ) 5 MG TABS tablet . Please advise.

## 2024-09-30 NOTE — Telephone Encounter (Signed)
 Prescription refill request for Eliquis  received. Indication: A. FIB Last office visit: 07/20/24 Scr: 1.31 (07/12/24) Age: 68 Weight: 76.5 kg  Okay to refill per protocol. Called patient's sister, DPR, to let her know eliquis  was not discontinue and he should continue. Patient's sister verbalized understanding.

## 2024-10-11 ENCOUNTER — Other Ambulatory Visit: Payer: Self-pay

## 2024-10-11 ENCOUNTER — Encounter (HOSPITAL_COMMUNITY): Payer: Self-pay | Admitting: Surgery

## 2024-10-11 ENCOUNTER — Ambulatory Visit: Attending: Surgery

## 2024-10-11 VITALS — BP 123/86 | HR 62 | Wt 170.6 lb

## 2024-10-11 DIAGNOSIS — Z9889 Other specified postprocedural states: Secondary | ICD-10-CM

## 2024-10-11 DIAGNOSIS — S31103A Unspecified open wound of abdominal wall, right lower quadrant without penetration into peritoneal cavity, initial encounter: Secondary | ICD-10-CM

## 2024-10-11 DIAGNOSIS — T8189XA Other complications of procedures, not elsewhere classified, initial encounter: Secondary | ICD-10-CM

## 2024-10-11 DIAGNOSIS — I70213 Atherosclerosis of native arteries of extremities with intermittent claudication, bilateral legs: Secondary | ICD-10-CM

## 2024-10-11 NOTE — Progress Notes (Signed)
 POST OPERATIVE OFFICE NOTE    CC:  F/u for surgery  HPI: Manuel Richardson is a 68 y.o. male who is here for repeat wound check.  He recently underwent endovascular repair of AAA using aorto uni iliac endograft, open bilateral femoral artery exposure, bilateral femoral endarterectomy, left femoral artery dacryon patch angioplasty, femorofemoral bypass,  and left common iliac and external iliac artery intra-arterial lithotripsy and angioplasty by Dr. Serene on 07/07/2024.  This was done for treatment of a AAA with severe claudication of the right lower extremity.   He returns today for repeat wound check.  He presented to our clinic as a triage visit 2 weeks ago with new fluid collection of both groins.  On exam he had an erythematous, fluid-filled protrusion from the right groin incision with concern for possible infection.  He was started on oral antibiotics.  At follow-up today, the patient states that his right groin incision opened up overnight and soaked his pants with clear/pinkish drainage.  He says that his right groin feels much better now that some fluid has drained out.  He denies any fevers or chills.  He denies any pain or worsening fluid collection in the left groin.  He has been taking his doxycycline  and has 2 days left.  He says that his claudication in the right leg is much better since surgery.  He has not taken his Eliquis  for at least 2 weeks because it is expensive.  Allergies[1]  Current Outpatient Medications  Medication Sig Dispense Refill   albuterol  (VENTOLIN  HFA) 108 (90 Base) MCG/ACT inhaler Inhale 2 puffs into the lungs every 6 (six) hours as needed for wheezing or shortness of breath. 8 g 2   amLODipine  (NORVASC ) 5 MG tablet Take 1 tablet (5 mg total) by mouth daily. 90 tablet 3   apixaban  (ELIQUIS ) 5 MG TABS tablet Take 1 tablet (5 mg total) by mouth 2 (two) times daily. 180 tablet 1   BREZTRI  AEROSPHERE 160-9-4.8 MCG/ACT AERO inhaler Inhale 2 puffs into the lungs 2  (two) times daily. 10.7 g 6   budesonide -glycopyrrolate -formoterol  (BREZTRI  AEROSPHERE) 160-9-4.8 MCG/ACT AERO inhaler Inhale 2 puffs into the lungs in the morning and at bedtime. 1 each 0   clopidogrel  (PLAVIX ) 75 MG tablet Take 1 tablet (75 mg total) by mouth daily. 90 tablet 3   doxycycline  (VIBRAMYCIN ) 100 MG capsule Take 1 capsule (100 mg total) by mouth 2 (two) times daily. 28 capsule 0   ezetimibe  (ZETIA ) 10 MG tablet Take 1 tablet (10 mg total) by mouth daily. 90 tablet 3   losartan  (COZAAR ) 50 MG tablet TAKE 1 TABLET BY MOUTH EVERY DAY 90 tablet 3   metoprolol  succinate (TOPROL -XL) 50 MG 24 hr tablet TAKE 1 TABLET BY MOUTH DAILY. TAKE WITH OR IMMEDIATELY FOLLOWING A MEAL. 90 tablet 3   nitroGLYCERIN  (NITROSTAT ) 0.4 MG SL tablet Place 1 tablet (0.4 mg total) under the tongue every 5 (five) minutes as needed for chest pain. 25 tablet 3   ranolazine  (RANEXA ) 500 MG 12 hr tablet Take 1 tablet (500 mg total) by mouth 2 (two) times daily. 180 tablet 2   rosuvastatin  (CRESTOR ) 40 MG tablet Take 1 tablet (40 mg total) by mouth daily. 90 tablet 3   No current facility-administered medications for this visit.     ROS:  See HPI  Physical Exam:  Incision: Left groin incision well-healed with mild fullness but soft.  Right groin with punctate area of dehiscence, expressible serosanguineous fluid. Extremities: Brisk left DP/PT  Doppler signals.  Palpable right PT.  Palpable pulse in femorofemoral bypass graft Neuro: Alert and oriented x 3      Assessment/Plan:  This is a 68 y.o. male who is here for repeat wound check  - The patient presents today for repeat right groin wound check.  He recently underwent aorto uni iliac stent graft repair, bilateral femoral endarterectomies, left femoral dacryon patch angioplasty, and left to right femorofemoral bypass.  He was seen at our office 2 weeks ago with fluid collections in both groins and erythematous skin changes to the right groin.  There was  concern for infection, however this was managed conservatively at that time with oral antibiotics - At repeat check today, the patient states that his right groin incision opened up overnight and drainage soaked his pants.  He says that his pain in the right groin has improved after drainage has come out.  He denies any fevers or chills.  He denies any worsening changes to his left groin. -On exam his left groin still has some mild fullness, however this is soft without any overlying skin changes.  He does have punctate dehiscence of his right groin incision with expressible serosanguineous drainage.  He has a palpable pulse in his femorofemoral bypass graft without fluctuance - Bilateral lower extremities remain well-perfused.  He has a brisk left DP Doppler signal and palpable right PT pulse -The patient was also evaluated by Dr. Serene.  Given that the patient's right groin incision has now opened up and he continues to have drainage from this area, there is increased concern for infection.  This cannot be managed with oral antibiotics only at this time.  He will require right groin washout so we can make sure that his bypass does not get infected. We have explained to the patient that he will require right groin washout, possible debridement, possible sartorius muscle flap, and wound VAC placement.  The patient has not been taking his Eliquis , so we can do surgery tomorrow.  He will need to be admitted for at least a day after surgery.  The patient is agreeable for surgery.   Ahmed Holster, PA-C [facility] [phone]   Clinic MD:  Serene      [1] No Known Allergies

## 2024-10-11 NOTE — Anesthesia Preprocedure Evaluation (Signed)
 Anesthesia Evaluation  Patient identified by MRN, date of birth, ID band Patient awake    Reviewed: Allergy & Precautions, NPO status , Patient's Chart, lab work & pertinent test results, reviewed documented beta blocker date and time   Airway Mallampati: I  TM Distance: >3 FB Neck ROM: Full    Dental  (+) Dental Advisory Given, Lower Dentures, Upper Dentures   Pulmonary COPD,  COPD inhaler and oxygen  dependent, former smoker home O2 at night and as needed   Pulmonary exam normal breath sounds clear to auscultation       Cardiovascular hypertension, Pt. on medications and Pt. on home beta blockers + CAD and + Peripheral Vascular Disease (s/p left CCA-SCA bypass 7 mm Dacron)  Normal cardiovascular exam Rhythm:Regular Rate:Normal  S/p EVAR AAA and FFBG 07/07/2024  He had a high risk stress test in March 2025 showing EF 33-36%, inferior ischemia. (LVEF 55-60%, no RWMA by 09/2023 TTE.)   Neuro/Psych negative neurological ROS  negative psych ROS   GI/Hepatic Neg liver ROS,GERD  ,,  Endo/Other  negative endocrine ROS    Renal/GU negative Renal ROS     Musculoskeletal Right Groin Wound   Abdominal   Peds  Hematology  (+) Blood dyscrasia (Eliquis , Plavix )   Anesthesia Other Findings Day of surgery medications reviewed with the patient.  Reproductive/Obstetrics                              Anesthesia Physical Anesthesia Plan  ASA: 4  Anesthesia Plan: General   Post-op Pain Management: Ofirmev  IV (intra-op)*   Induction: Intravenous  PONV Risk Score and Plan: 2 and Dexamethasone  and Ondansetron   Airway Management Planned: Oral ETT  Additional Equipment:   Intra-op Plan:   Post-operative Plan: Extubation in OR  Informed Consent: I have reviewed the patients History and Physical, chart, labs and discussed the procedure including the risks, benefits and alternatives for the proposed  anesthesia with the patient or authorized representative who has indicated his/her understanding and acceptance.     Dental advisory given  Plan Discussed with: CRNA  Anesthesia Plan Comments: (PAT note written 10/11/2024 by Demetra Moya, PA-C.  )         Anesthesia Quick Evaluation

## 2024-10-11 NOTE — Progress Notes (Signed)
 SDW call  Patient was given pre-op instructions over the phone. Patient verbalized understanding of instructions provided.  Denies any SOB, fever or cough   PCP - Dr. Massie Sewer Cardiologist - Dr. Newman Lawrence, LOV 07/20/2024 Pulmonary: Dr. Dedra Sanders   PPM/ICD - denies Device Orders - na Rep Notified - na   Chest x-ray -  EKG -  07/20/2024 Stress Test - 01/01/2024 ECHO - 07/09/2024 Cardiac Cath - 01/29/2024  Sleep Study/sleep apnea/CPAP: denies  Non-diabetic  Blood Thinner Instructions: Plavix , hold), states last dose 10/10/2024 Aspirin  Instructions: continue   ERAS Protcol - NPO   Anesthesia review: Yes. HTN, CAD, COPD  Your procedure is scheduled on Tuesday October 12, 2024  Report to [facility] Main Entrance A at  0830  A.M., then check in with the Admitting office.  Call this number if you have problems the morning of surgery:  [phone]   If you have any questions prior to your surgery date call [phone]: Open Monday-Friday 8am-4pm If you experience any cold or flu symptoms such as cough, fever, chills, shortness of breath, etc. between now and your scheduled surgery, please notify us  at the above number     Remember:  Do not eat or drink after midnight the night before your surgery  Take these medicines the morning of surgery with A SIP OF WATER:  Amlodipine , ASA, breztri , zetia , metoprolol , ranexa , rosuvastatin   As needed: albuterol   As of today, STOP taking any Aleve, Naproxen, Ibuprofen, Motrin, Advil, Goody's, BC's, all herbal medications, fish oil, and all vitamins.

## 2024-10-11 NOTE — H&P (View-Only) (Signed)
 POST OPERATIVE OFFICE NOTE    CC:  F/u for surgery  HPI: Manuel Richardson is a 68 y.o. male who is here for repeat wound check.  He recently underwent endovascular repair of AAA using aorto uni iliac endograft, open bilateral femoral artery exposure, bilateral femoral endarterectomy, left femoral artery dacryon patch angioplasty, femorofemoral bypass,  and left common iliac and external iliac artery intra-arterial lithotripsy and angioplasty by Dr. Serene on 07/07/2024.  This was done for treatment of a AAA with severe claudication of the right lower extremity.   He returns today for repeat wound check.  He presented to our clinic as a triage visit 2 weeks ago with new fluid collection of both groins.  On exam he had an erythematous, fluid-filled protrusion from the right groin incision with concern for possible infection.  He was started on oral antibiotics.  At follow-up today, the patient states that his right groin incision opened up overnight and soaked his pants with clear/pinkish drainage.  He says that his right groin feels much better now that some fluid has drained out.  He denies any fevers or chills.  He denies any pain or worsening fluid collection in the left groin.  He has been taking his doxycycline  and has 2 days left.  He says that his claudication in the right leg is much better since surgery.  He has not taken his Eliquis  for at least 2 weeks because it is expensive.  Allergies[1]  Current Outpatient Medications  Medication Sig Dispense Refill   albuterol  (VENTOLIN  HFA) 108 (90 Base) MCG/ACT inhaler Inhale 2 puffs into the lungs every 6 (six) hours as needed for wheezing or shortness of breath. 8 g 2   amLODipine  (NORVASC ) 5 MG tablet Take 1 tablet (5 mg total) by mouth daily. 90 tablet 3   apixaban  (ELIQUIS ) 5 MG TABS tablet Take 1 tablet (5 mg total) by mouth 2 (two) times daily. 180 tablet 1   BREZTRI  AEROSPHERE 160-9-4.8 MCG/ACT AERO inhaler Inhale 2 puffs into the lungs 2  (two) times daily. 10.7 g 6   budesonide -glycopyrrolate -formoterol  (BREZTRI  AEROSPHERE) 160-9-4.8 MCG/ACT AERO inhaler Inhale 2 puffs into the lungs in the morning and at bedtime. 1 each 0   clopidogrel  (PLAVIX ) 75 MG tablet Take 1 tablet (75 mg total) by mouth daily. 90 tablet 3   doxycycline  (VIBRAMYCIN ) 100 MG capsule Take 1 capsule (100 mg total) by mouth 2 (two) times daily. 28 capsule 0   ezetimibe  (ZETIA ) 10 MG tablet Take 1 tablet (10 mg total) by mouth daily. 90 tablet 3   losartan  (COZAAR ) 50 MG tablet TAKE 1 TABLET BY MOUTH EVERY DAY 90 tablet 3   metoprolol  succinate (TOPROL -XL) 50 MG 24 hr tablet TAKE 1 TABLET BY MOUTH DAILY. TAKE WITH OR IMMEDIATELY FOLLOWING A MEAL. 90 tablet 3   nitroGLYCERIN  (NITROSTAT ) 0.4 MG SL tablet Place 1 tablet (0.4 mg total) under the tongue every 5 (five) minutes as needed for chest pain. 25 tablet 3   ranolazine  (RANEXA ) 500 MG 12 hr tablet Take 1 tablet (500 mg total) by mouth 2 (two) times daily. 180 tablet 2   rosuvastatin  (CRESTOR ) 40 MG tablet Take 1 tablet (40 mg total) by mouth daily. 90 tablet 3   No current facility-administered medications for this visit.     ROS:  See HPI  Physical Exam:  Incision: Left groin incision well-healed with mild fullness but soft.  Right groin with punctate area of dehiscence, expressible serosanguineous fluid. Extremities: Brisk left DP/PT  Doppler signals.  Palpable right PT.  Palpable pulse in femorofemoral bypass graft Neuro: Alert and oriented x 3      Assessment/Plan:  This is a 68 y.o. male who is here for repeat wound check  - The patient presents today for repeat right groin wound check.  He recently underwent aorto uni iliac stent graft repair, bilateral femoral endarterectomies, left femoral dacryon patch angioplasty, and left to right femorofemoral bypass.  He was seen at our office 2 weeks ago with fluid collections in both groins and erythematous skin changes to the right groin.  There was  concern for infection, however this was managed conservatively at that time with oral antibiotics - At repeat check today, the patient states that his right groin incision opened up overnight and drainage soaked his pants.  He says that his pain in the right groin has improved after drainage has come out.  He denies any fevers or chills.  He denies any worsening changes to his left groin. -On exam his left groin still has some mild fullness, however this is soft without any overlying skin changes.  He does have punctate dehiscence of his right groin incision with expressible serosanguineous drainage.  He has a palpable pulse in his femorofemoral bypass graft without fluctuance - Bilateral lower extremities remain well-perfused.  He has a brisk left DP Doppler signal and palpable right PT pulse -The patient was also evaluated by Dr. Serene.  Given that the patient's right groin incision has now opened up and he continues to have drainage from this area, there is increased concern for infection.  This cannot be managed with oral antibiotics only at this time.  He will require right groin washout so we can make sure that his bypass does not get infected. We have explained to the patient that he will require right groin washout, possible debridement, possible sartorius muscle flap, and wound VAC placement.  The patient has not been taking his Eliquis , so we can do surgery tomorrow.  He will need to be admitted for at least a day after surgery.  The patient is agreeable for surgery.   Ahmed Holster, PA-C [facility] [phone]   Clinic MD:  Serene      [1] No Known Allergies

## 2024-10-11 NOTE — Progress Notes (Signed)
 Anesthesia Chart Review: SAME DAY WORK-UP  Case: [id number] Date/Time: 10/12/24 1042   Procedures:      IRRIGATION AND DEBRIDEMENT WOUND (Right)     APPLICATION, WOUND VAC (Right)     CLOSURE, WOUND, USING MUSCLE FLAP (Right)   Anesthesia type: Choice   Diagnosis: Non-healing surgical wound, initial encounter [T81.89XA]   Pre-op diagnosis: Right Groin Wound   [facility] ROOM 12 / [facility]   Surgeons: Serene Gaile ORN, MD       DISCUSSION: Patient is a 68 year old male scheduled for the above procedure. S/p EVAR AAA and FFBG 07/07/2024. He had VVS follow-up on 10/11/2024 for follow-up bilateral groin wounds, R > L. He has been on oral doxycyline. The right groin opened up and started to drain. The above procedure recommended. He had been started on Eliquis  in 06/2024 for recurrent PAF, but he had stopped on his own due to its expensive cost.    History includes former smoker (quit 10/28/2022), COPD (Gold Stage 3 severe, home O2 at night and as needed), HTN, HLD, CAD (100% proximal to mid RCA, unsuccessful CTO PCI 01/29/2024), PAF (03/2021 insetting of COVID & post-EVAR/FFBG 06/2024), PAD & AAA (s/p EVAR AAA, Left EIA & CIA intra-arterial lithotripsy w/ left EIA PTA, bilateral femoral endarterectomies, FFBG 07/07/2024), carotid artery occlusion, left subclavian stenosis (s/p left CCA-SCA bypass using 7 mm Dacron graft 11/01/2010), GERD, exertional dyspnea, full dentures.   Last office visit with cardiology was on 07/20/2024 with Darryle Currier, PA-C. He had a high risk stress test in March 2025 showing EF 33-36%, inferior ischemia. (LVEF 55-60%, no RWMA by 09/2023 TTE.). Cardiac cath on 01/16/2024 showed moderate calcification but no significant stenosis in the LM and LAD. No significant stenosis in the LCX. 99% proximal RCA stenosis followed by CTO. He had an unsuccessful attempt at RCT CTP intervention on 01/29/2024 and has been treated medically. Ranexa  added and ASA changed to Plavix  monotherapy. Also on  Motifene, metoprolol  succinate, losartan , Crestor , Zetia .  Had also been on Leqvio. Chronic DOE, likely combination of CAD and COPD. For his EVAR/FFBG, Dr. Elmira had classified him a elevated, but not prohibitive, CV risk due to CAD with known CTO, but was optimized from a cardiac standpoint at that time. Of note, he had not been on Eliquis  given no recurrent afib since COVID 03/2021, but had post-operative afib with RVR 07/08/2024. He converted on amiodarone . Eliquis  started, but he reported stopped about 2 weeks ago due to cost. Amiodarone  was discontinued about a month later. 4 month follow-up planned.    Pulmonologist Dr. Tamea classified him as intermediate risk for post-operative pulmonary complications for his EVAR/FFBG in September 2025. He has Gold Stage 3 severe COPD on home O2 (nocturnal and as needed during the day). He stopped smoking in 2024. He was only able to participate in 10 sessions of pulmonary rehab due to severe claudication. He had significant benefit from high-flow oxygen  use (using at nighttime 2L). No saturations with ambulatory oximetry.  Was using Breztri , as needed albuterol . No longer on Ohtuvayre  (ensifentrine ) by med list. At 08/19/2024 follow-up, respiratory status stable. Needed Breztri  refill. Once cleared by vascular, then could hopefully get back into pulmonary rehab.    Anesthesia team to evaluate on the day of surgery. He is for updated labs on arrival as indicated.    VS: Ht 5' 8 (1.727 m)   Wt 77.1 kg   BMI 25.85 kg/m  BP Readings from Last 3 Encounters:  10/11/24 123/86  09/28/24 122/78  08/30/24 133/83   Pulse Readings from Last 3 Encounters:  10/11/24 62  09/28/24 68  08/30/24 68    PROVIDERS: Valentin Skates, DO is PCP  Elmira Penman, MD is cardiologist Tamea Saunas, MD is pulmonologist  Serene Gaile ORN, MD is vascular surgeon     LABS: For day of surgery as indicated. Last results in [facility] include: Lab Results  Component  Value Date   WBC 4.5 07/12/2024   HGB 8.6 (L) 07/12/2024   HCT 26.0 (L) 07/12/2024   PLT 132 (L) 07/12/2024   GLUCOSE 121 (H) 07/12/2024   ALT 24 07/11/2024   AST 27 07/11/2024   NA 134 (L) 07/12/2024   K 4.3 07/12/2024   CL 102 07/12/2024   CREATININE 1.31 (H) 07/12/2024   BUN 19 07/12/2024   CO2 23 07/12/2024   TSH 9.894 (H) 07/09/2024   INR 1.0 07/06/2024   HGBA1C 6.3 (H) 07/09/2024      Latest Ref Rng & Units 07/12/2024    3:32 AM 07/11/2024    2:34 AM 07/10/2024    3:34 AM  CBC  WBC 4.0 - 10.5 K/uL 4.5  5.3  7.2   Hemoglobin 13.0 - 17.0 g/dL 8.6  9.1  9.4   Hematocrit 39.0 - 52.0 % 26.0  27.7  28.6   Platelets 150 - 400 K/uL 132  138  126      OTHER: Ambulatory oximetry 06/16/2024: At rest on room air oxygen  saturation was 100%, the patient ambulated at a normal pace, completed 3 laps, O2 nadir 94%, moderate shortness of breath.  Resting heart rate was 68 bpm at maximum for this exercise 94 bpm.  No clinically significant oxygen  desaturation noted.   Overnight Pulse Oximetry Study 02/25/2024: Qualified - Group 1: SpO2 fell to 88% or less.   Alpha-1 antitrypsin 02/18/2024: Phenotype MS, level 145 mg/dL   PFTs 05/29/7973: FEV1 8.42 L or 48% predicted, FVC 3.23 L or 73% of predicted, FEV1/FVC 48%, no significant bronchodilator response, volumes show air trapping and mild hyperinflation, diffusion capacity is severely decreased.  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     IMAGES: CTA Ao+BiFem 08/10/2024: IMPRESSION: VASCULAR 1. Interval left right fem-fem bypass graft appears widely patent without evidence of complication at either anastomosis. Fresh bilateral femoral cutdown incisions are noted as well as a small amount of fluid tracking along the graft which likely represents postoperative hematoma. Findings are likely within normal given the recent postoperative state. However, if there are clinical signs of infection, this could have a similar  imaging appearance. 2. Remote prior aortouniiliac repair of excluded infrarenal abdominal aortic aneurysm without evidence of complication. 3. Chronic occlusion of the right common, internal and external iliac arteries. 4. Moderate to high-grade stenosis of the left P1 and P2 segments of the popliteal artery due to heavily calcified atherosclerotic plaque. 5. Moderate stenosis of the proximal SMA. 6. Moderate to high-grade stenosis of the proximal left renal artery.   NON-VASCULAR 1. Advanced centrilobular pulmonary emphysema and diffuse mild bronchial wall thickening suggesting superimposed chronic bronchitis. 2. Additional ancillary findings as above.   CT Chest LCS 10/10/2023: IMPRESSION: 1. Lung-RADS 3S, probably benign findings. Short-term follow-up in 6 months is recommended with repeat low-dose chest CT without contrast (please use the following order, CT CHEST LCS NODULE FOLLOW-UP W/O CM). 2. The S modifier above refers to potentially clinically significant non lung cancer related findings. Specifically, there is aortic atherosclerosis, in addition to left main and three-vessel  coronary artery disease. Please note that although the presence of coronary artery calcium  documents the presence of coronary artery disease, the severity of this disease and any potential stenosis cannot be assessed on this non-gated CT examination. Assessment for potential risk factor modification, dietary therapy or pharmacologic therapy may be warranted, if clinically indicated. 3. Mild diffuse bronchial wall thickening with severe centrilobular and paraseptal emphysema; imaging findings suggestive of underlying COPD. - Aortic Atherosclerosis (ICD10-I70.0) and Emphysema (ICD10-J43.9).   CTA Head 06/11/2023: IMPRESSION: 1. Occlusion of the cervical, petrous, and cavernous segments of the bilateral internal carotid arteries with reconstitution of the level of the supraclinoid ICAs via  robust PCOM bilaterally. Although no prior imaging is available for comparison, this finding was present on prior report available in care everywhere dated 05/07/2021. 2. No acute intracranial abnormality.     EKG: 01/30/2024: NSR     CV: US  Carotid 05/14/2024: Summary:  - Right Carotid: Evidence consistent with a total occlusion of the right ICA. Non-hemodynamically significant plaque <50% noted in the CCA. The ECA appears >50% stenosed.  - Left Carotid: Evidence consistent with a total occlusion of the left ICA. Hemodynamically significant plaque >50% visualized in the CCA. The ECA appears <50% stenosed. Patent left subclavian subclavian artery bypass graft.  - Vertebrals:  Bilateral vertebral arteries demonstrate antegrade flow.  - Subclavians: Normal flow hemodynamics were seen in the right subclavian artery.      Attempted PCI 01/29/2024:   Prox RCA to Mid RCA lesion is 100% stenosed.   Post intervention, there is a 100% residual stenosis.   The radiation dose exceeded thresholds defined in the Patient Radiation Dose Management For Interventional Medical Procedures With Extensive Use of Fluoroscopy policy. Specific follow up instructions will be provided to the patient prior to discharge.   Unsuccessful CTO PCI of the RCA - unable to cross with a wire   Plan: will observe overnight. Can DC Plavix . Recommend medical therapy     Coronary angiography 01/16/2024: LM: Moderate calcification, no significant stenosis LAD: Moderate calcification, tortuous vessel with minimal luminal irregularities.  No significant stenosis Lcx: No significant stenosis RCA: Dominant vessel with proximal 99% stenosis, followed by chronic total occlusion.          Left to right collaterals from LAD septal branches, feeling distal RCA and beyond   LVEDP 10 mmHg   Unsuccessful attempt of antegrade approach with Prowater and TelePort microcatheter support. Decided not to attempt further to avoid any  hematoma. Discussed with Dr. Jordan. Will arrange for CTO intervention on April 3 on May 8.      NM PET CT Cardiac Perfusion Study 01/01/2024:   Medium, moderate reversible defect in the basal to mid inferior segments and basal inferolateral segment consistent with ischemia. LVEF is moderately reduced with increase at stress (EF 33%->36%). No TID. MBFR is mildly abnormal (1.92). Overall, these findings are high risk due to ischemia, abnormal MBFR, and resting LVEF <35%.   LV perfusion is abnormal. There is evidence of ischemia. There is no evidence of infarction. Defect 1: There is a medium defect with moderate reduction in uptake present in the mid to basal inferior and inferolateral location(s) that is reversible. There is abnormal wall motion in the defect area. Consistent with ischemia.   Rest left ventricular function is abnormal. Rest global function is moderately reduced. There were no regional wall motion abnormalities. Rest EF: 33%. Stress left ventricular function is abnormal. Stress global function is moderately reduced. There were no regional wall abnormalities.  Stress EF: 36%. End diastolic cavity size is normal.   Myocardial blood flow was computed to be 1.24ml/g/min at rest and 2.63ml/g/min at stress. Global myocardial blood flow reserve was 1.92 and was mildly abnormal.   Coronary calcium  was present on the attenuation correction CT images. Severe coronary calcifications were present. Coronary calcifications were present in the left anterior descending artery and right coronary artery distribution(s).   Findings are consistent with ischemia. The study is high risk.     Echo 10/09/2023: IMPRESSIONS   1. Left ventricular ejection fraction, by estimation, is 55 to 60%. The  left ventricle has normal function. The left ventricle has no regional  wall motion abnormalities. There is mild left ventricular hypertrophy.  Left ventricular diastolic parameters  are consistent with Grade I  diastolic dysfunction (impaired relaxation).   2. Right ventricular systolic function is normal. The right ventricular  size is normal. Tricuspid regurgitation signal is inadequate for assessing  PA pressure.   3. The mitral valve is normal in structure. Trivial mitral valve  regurgitation. No evidence of mitral stenosis.   4. The aortic valve is tricuspid. Aortic valve regurgitation is not  visualized. No aortic stenosis is present.   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   s/p left CCA-SCA bypass 7 mm Dacron   Tobacco use disorder     Past Surgical History:  Procedure Laterality Date   ABDOMINAL AORTIC ENDOVASCULAR STENT GRAFT N/A 07/07/2024   Procedure: INSERTION, ENDOVASCULAR STENT GRAFT, AORTA, aneurysms repair;  Surgeon: Serene Gaile ORN, MD;  Location: [facility] OR;  Service: Vascular;  Laterality: N/A;   carotid artery surgery  2012   stent in left side   COLONOSCOPY  2007   CORONARY CTO INTERVENTION N/A 01/29/2024   Procedure: CORONARY CTO INTERVENTION;  Surgeon: Jordan, Peter M, MD;  Location: [facility];  Service: Cardiovascular;  Laterality: N/A;   ELBOW SURGERY  teenager   left   ENDARTERECTOMY FEMORAL Bilateral 07/07/2024   Procedure: Bilateral ENDARTERECTOMY, FEMORAL;  Surgeon: Serene Gaile ORN, MD;  Location: [facility] OR;  Service: Vascular;  Laterality: Bilateral;   FEMORAL-FEMORAL BYPASS GRAFT Bilateral 07/07/2024   Procedure: CREATION, BYPASS, ARTERIAL, FEMORAL TO FEMORAL, USING GRAFT;  Surgeon: Serene Gaile ORN, MD;  Location: [facility];  Service: Vascular;  Laterality: Bilateral;   LEFT HEART CATH AND CORONARY ANGIOGRAPHY N/A  01/16/2024   Procedure: LEFT HEART CATH AND CORONARY ANGIOGRAPHY;  Surgeon: Elmira Newman PARAS, MD;  Location: [facility];  Service: Cardiovascular;  Laterality: N/A;   PATCH ANGIOPLASTY Left 07/07/2024   Procedure: ANGIOPLASTY, USING PATCH GRAFT;  Surgeon: Serene Gaile ORN, MD;  Location: [facility];  Service: Vascular;  Laterality: Left;    MEDICATIONS:  albuterol  (VENTOLIN  HFA) 108 (90 Base) MCG/ACT inhaler   amLODipine  (NORVASC ) 5 MG tablet   aspirin  EC 325 MG tablet   BREZTRI  AEROSPHERE 160-9-4.8 MCG/ACT AERO inhaler   budesonide -glycopyrrolate -formoterol  (BREZTRI  AEROSPHERE) 160-9-4.8 MCG/ACT AERO inhaler   Calcium  Carbonate Antacid (TUMS PO)   clopidogrel  (PLAVIX ) 75 MG tablet   doxycycline  (VIBRAMYCIN ) 100 MG capsule  ezetimibe  (ZETIA ) 10 MG tablet   losartan  (COZAAR ) 50 MG tablet   metoprolol  succinate (TOPROL -XL) 50 MG 24 hr tablet   ranolazine  (RANEXA ) 500 MG 12 hr tablet   rosuvastatin  (CRESTOR ) 40 MG tablet   apixaban  (ELIQUIS ) 5 MG TABS tablet   nitroGLYCERIN  (NITROSTAT ) 0.4 MG SL tablet    Isaiah Ruder, PA-C Surgical Short Stay/Anesthesiology [facility] Phone [phone] [facility] Phone [phone] 10/11/2024 3:27 PM

## 2024-10-12 ENCOUNTER — Observation Stay (HOSPITAL_COMMUNITY): Admission: RE | Admit: 2024-10-12 | Discharge: 2024-10-13 | Disposition: A | Attending: Surgery | Admitting: Surgery

## 2024-10-12 ENCOUNTER — Encounter (HOSPITAL_COMMUNITY): Payer: Self-pay | Admitting: Surgery

## 2024-10-12 ENCOUNTER — Ambulatory Visit (HOSPITAL_BASED_OUTPATIENT_CLINIC_OR_DEPARTMENT_OTHER): Payer: Self-pay | Admitting: Vascular Surgery

## 2024-10-12 ENCOUNTER — Ambulatory Visit (HOSPITAL_COMMUNITY): Payer: Self-pay | Admitting: Vascular Surgery

## 2024-10-12 ENCOUNTER — Encounter (HOSPITAL_COMMUNITY): Admission: RE | Disposition: A | Payer: Self-pay | Source: Home / Self Care | Attending: Surgery

## 2024-10-12 ENCOUNTER — Other Ambulatory Visit: Payer: Self-pay

## 2024-10-12 DIAGNOSIS — I2510 Atherosclerotic heart disease of native coronary artery without angina pectoris: Secondary | ICD-10-CM | POA: Diagnosis not present

## 2024-10-12 DIAGNOSIS — T8189XA Other complications of procedures, not elsewhere classified, initial encounter: Principal | ICD-10-CM | POA: Diagnosis present

## 2024-10-12 DIAGNOSIS — I10 Essential (primary) hypertension: Secondary | ICD-10-CM | POA: Diagnosis not present

## 2024-10-12 DIAGNOSIS — Z8616 Personal history of COVID-19: Secondary | ICD-10-CM | POA: Insufficient documentation

## 2024-10-12 DIAGNOSIS — Z7901 Long term (current) use of anticoagulants: Secondary | ICD-10-CM | POA: Diagnosis not present

## 2024-10-12 DIAGNOSIS — Z87891 Personal history of nicotine dependence: Secondary | ICD-10-CM | POA: Insufficient documentation

## 2024-10-12 DIAGNOSIS — Z7902 Long term (current) use of antithrombotics/antiplatelets: Secondary | ICD-10-CM | POA: Diagnosis not present

## 2024-10-12 DIAGNOSIS — X58XXXA Exposure to other specified factors, initial encounter: Secondary | ICD-10-CM | POA: Insufficient documentation

## 2024-10-12 DIAGNOSIS — J449 Chronic obstructive pulmonary disease, unspecified: Secondary | ICD-10-CM | POA: Insufficient documentation

## 2024-10-12 DIAGNOSIS — S31109A Unspecified open wound of abdominal wall, unspecified quadrant without penetration into peritoneal cavity, initial encounter: Secondary | ICD-10-CM

## 2024-10-12 DIAGNOSIS — Z79899 Other long term (current) drug therapy: Secondary | ICD-10-CM | POA: Insufficient documentation

## 2024-10-12 HISTORY — PX: INCISION AND DRAINAGE OF WOUND: SHX1803

## 2024-10-12 HISTORY — PX: APPLICATION, SKIN SUBSTITUTE: SHX7530

## 2024-10-12 HISTORY — PX: APPLICATION OF WOUND VAC: SHX5189

## 2024-10-12 LAB — POCT I-STAT, CHEM 8
BUN: 36 mg/dL — ABNORMAL HIGH (ref 8–23)
Calcium, Ion: 1.23 mmol/L (ref 1.15–1.40)
Chloride: 106 mmol/L (ref 98–111)
Creatinine, Ser: 1.6 mg/dL — ABNORMAL HIGH (ref 0.61–1.24)
Glucose, Bld: 118 mg/dL — ABNORMAL HIGH (ref 70–99)
HCT: 44 % (ref 39.0–52.0)
Hemoglobin: 15 g/dL (ref 13.0–17.0)
Potassium: 4.6 mmol/L (ref 3.5–5.1)
Sodium: 138 mmol/L (ref 135–145)
TCO2: 21 mmol/L — ABNORMAL LOW (ref 22–32)

## 2024-10-12 LAB — CBC
HCT: 39.6 % (ref 39.0–52.0)
Hemoglobin: 13.1 g/dL (ref 13.0–17.0)
MCH: 30.1 pg (ref 26.0–34.0)
MCHC: 33.1 g/dL (ref 30.0–36.0)
MCV: 91 fL (ref 80.0–100.0)
Platelets: 146 K/uL — ABNORMAL LOW (ref 150–400)
RBC: 4.35 MIL/uL (ref 4.22–5.81)
RDW: 13.4 % (ref 11.5–15.5)
WBC: 5.1 K/uL (ref 4.0–10.5)
nRBC: 0 % (ref 0.0–0.2)

## 2024-10-12 LAB — CREATININE, SERUM
Creatinine, Ser: 1.22 mg/dL (ref 0.61–1.24)
GFR, Estimated: >60 mL/min (ref >60)

## 2024-10-12 SURGERY — IRRIGATION AND DEBRIDEMENT WOUND
Anesthesia: General | Site: Groin | Laterality: Right

## 2024-10-12 MED ORDER — CHLORHEXIDINE GLUCONATE 0.12 % MT SOLN
15.0000 mL | Freq: Once | OROMUCOSAL | Status: AC
  Administered 2024-10-12: 09:00:00 15 mL via OROMUCOSAL
  Filled 2024-10-12: qty 15

## 2024-10-12 MED ORDER — ACETAMINOPHEN 325 MG PO TABS: 325.0000 mg | ORAL_TABLET | ORAL | Status: DC | PRN

## 2024-10-12 MED ORDER — MORPHINE SULFATE (PF) 2 MG/ML IV SOLN: 2.0000 mg | INTRAVENOUS | Status: DC | PRN

## 2024-10-12 MED ORDER — ALBUMIN HUMAN 5 % IV SOLN: INTRAVENOUS | Status: DC | PRN

## 2024-10-12 MED ORDER — CHLORHEXIDINE GLUCONATE 4 % EX SOLN: 60.0000 mL | Freq: Once | CUTANEOUS | Status: DC

## 2024-10-12 MED ORDER — LACTATED RINGERS IV SOLN: INTRAVENOUS | Status: DC

## 2024-10-12 MED ORDER — FENTANYL CITRATE (PF) 100 MCG/2ML IJ SOLN
INTRAMUSCULAR | Status: AC
  Filled 2024-10-12: qty 2

## 2024-10-12 MED ORDER — OXYCODONE-ACETAMINOPHEN 5-325 MG PO TABS
1.0000 | ORAL_TABLET | ORAL | Status: DC | PRN
  Administered 2024-10-12 (×2): 2 via ORAL
  Filled 2024-10-12: qty 1

## 2024-10-12 MED ORDER — CEFAZOLIN SODIUM-DEXTROSE 2-4 GM/100ML-% IV SOLN
2.0000 g | Freq: Three times a day (TID) | INTRAVENOUS | Status: AC
  Administered 2024-10-12 – 2024-10-13 (×2): 2 g via INTRAVENOUS
  Filled 2024-10-12 (×2): qty 100

## 2024-10-12 MED ORDER — 0.9 % SODIUM CHLORIDE (POUR BTL) OPTIME
TOPICAL | Status: DC | PRN
  Administered 2024-10-12: 11:00:00 1000 mL

## 2024-10-12 MED ORDER — RANOLAZINE ER 500 MG PO TB12
500.0000 mg | ORAL_TABLET | Freq: Two times a day (BID) | ORAL | Status: DC
  Administered 2024-10-12 – 2024-10-13 (×2): 500 mg via ORAL
  Filled 2024-10-12 (×2): qty 1

## 2024-10-12 MED ORDER — LABETALOL HCL 5 MG/ML IV SOLN: 10.0000 mg | INTRAVENOUS | Status: DC | PRN

## 2024-10-12 MED ORDER — OXYCODONE-ACETAMINOPHEN 5-325 MG PO TABS
ORAL_TABLET | ORAL | Status: AC
  Filled 2024-10-12: qty 2

## 2024-10-12 MED ORDER — POLYETHYLENE GLYCOL 3350 17 G PO PACK: 17.0000 g | PACK | Freq: Every day | ORAL | Status: DC | PRN

## 2024-10-12 MED ORDER — ALBUTEROL SULFATE (2.5 MG/3ML) 0.083% IN NEBU: 3.0000 mL | INHALATION_SOLUTION | Freq: Four times a day (QID) | RESPIRATORY_TRACT | Status: DC | PRN

## 2024-10-12 MED ORDER — BUDESON-GLYCOPYRROL-FORMOTEROL 160-9-4.8 MCG/ACT IN AERO
2.0000 | INHALATION_SPRAY | Freq: Two times a day (BID) | RESPIRATORY_TRACT | Status: DC
  Filled 2024-10-12 (×2): qty 5.9

## 2024-10-12 MED ORDER — PROPOFOL 10 MG/ML IV BOLUS
INTRAVENOUS | Status: DC | PRN
  Administered 2024-10-12: 11:00:00 20 mg via INTRAVENOUS
  Administered 2024-10-12: 11:00:00 150 mg via INTRAVENOUS

## 2024-10-12 MED ORDER — SODIUM CHLORIDE 0.9 % IV SOLN: INTRAVENOUS | Status: DC

## 2024-10-12 MED ORDER — DEXAMETHASONE SOD PHOSPHATE PF 10 MG/ML IJ SOLN
INTRAMUSCULAR | Status: DC | PRN
  Administered 2024-10-12: 12:00:00 5 mg via INTRAVENOUS

## 2024-10-12 MED ORDER — ASPIRIN 325 MG PO TBEC
325.0000 mg | DELAYED_RELEASE_TABLET | Freq: Every day | ORAL | Status: DC
  Administered 2024-10-13: 09:00:00 325 mg via ORAL
  Filled 2024-10-12: qty 1

## 2024-10-12 MED ORDER — LACTATED RINGERS IV SOLN: INTRAVENOUS | Status: DC | PRN

## 2024-10-12 MED ORDER — POTASSIUM CHLORIDE CRYS ER 20 MEQ PO TBCR: 40.0000 meq | EXTENDED_RELEASE_TABLET | Freq: Every day | ORAL | Status: DC | PRN

## 2024-10-12 MED ORDER — FENTANYL CITRATE (PF) 250 MCG/5ML IJ SOLN
INTRAMUSCULAR | Status: DC | PRN
  Administered 2024-10-12 (×2): 50 ug via INTRAVENOUS

## 2024-10-12 MED ORDER — LOSARTAN POTASSIUM 50 MG PO TABS
50.0000 mg | ORAL_TABLET | Freq: Every day | ORAL | Status: DC
  Administered 2024-10-13: 09:00:00 50 mg via ORAL
  Filled 2024-10-12: qty 1

## 2024-10-12 MED ORDER — SUGAMMADEX SODIUM 200 MG/2ML IV SOLN
INTRAVENOUS | Status: DC | PRN
  Administered 2024-10-12: 12:00:00 300 mg via INTRAVENOUS

## 2024-10-12 MED ORDER — METOPROLOL SUCCINATE ER 50 MG PO TB24
50.0000 mg | ORAL_TABLET | Freq: Every day | ORAL | Status: DC
  Administered 2024-10-13: 09:00:00 50 mg via ORAL
  Filled 2024-10-12: qty 1

## 2024-10-12 MED ORDER — CEFAZOLIN SODIUM-DEXTROSE 2-4 GM/100ML-% IV SOLN
2.0000 g | INTRAVENOUS | Status: AC
  Administered 2024-10-12: 12:00:00 2 g via INTRAVENOUS
  Filled 2024-10-12: qty 100

## 2024-10-12 MED ORDER — FENTANYL CITRATE (PF) 100 MCG/2ML IJ SOLN
25.0000 ug | INTRAMUSCULAR | Status: DC | PRN
  Administered 2024-10-12 (×3): 50 ug via INTRAVENOUS

## 2024-10-12 MED ORDER — CLOPIDOGREL BISULFATE 75 MG PO TABS
75.0000 mg | ORAL_TABLET | Freq: Every day | ORAL | Status: DC
  Administered 2024-10-13: 09:00:00 75 mg via ORAL
  Filled 2024-10-12: qty 1

## 2024-10-12 MED ORDER — ROSUVASTATIN CALCIUM 20 MG PO TABS
40.0000 mg | ORAL_TABLET | Freq: Every day | ORAL | Status: DC
  Administered 2024-10-12 – 2024-10-13 (×2): 40 mg via ORAL
  Filled 2024-10-12 (×2): qty 2

## 2024-10-12 MED ORDER — ORAL CARE MOUTH RINSE: 15.0000 mL | Freq: Once | OROMUCOSAL | Status: AC

## 2024-10-12 MED ORDER — HEPARIN SODIUM (PORCINE) 5000 UNIT/ML IJ SOLN
5000.0000 [IU] | Freq: Three times a day (TID) | INTRAMUSCULAR | Status: DC
  Administered 2024-10-13 (×2): 5000 [IU] via SUBCUTANEOUS
  Filled 2024-10-12 (×2): qty 1

## 2024-10-12 MED ORDER — HYDRALAZINE HCL 20 MG/ML IJ SOLN: 5.0000 mg | INTRAMUSCULAR | Status: DC | PRN

## 2024-10-12 MED ORDER — ACETAMINOPHEN 325 MG RE SUPP: 325.0000 mg | RECTAL | Status: DC | PRN

## 2024-10-12 MED ORDER — SODIUM CHLORIDE 0.9 % IR SOLN
Status: DC | PRN
  Administered 2024-10-12: 12:00:00 1000 mL

## 2024-10-12 MED ORDER — ONDANSETRON HCL 4 MG/2ML IJ SOLN: 4.0000 mg | Freq: Once | INTRAMUSCULAR | Status: DC | PRN

## 2024-10-12 MED ORDER — AMLODIPINE BESYLATE 5 MG PO TABS
5.0000 mg | ORAL_TABLET | Freq: Every day | ORAL | Status: DC
  Administered 2024-10-12 – 2024-10-13 (×2): 5 mg via ORAL
  Filled 2024-10-12 (×2): qty 1

## 2024-10-12 MED ORDER — ONDANSETRON HCL 4 MG/2ML IJ SOLN: 4.0000 mg | Freq: Four times a day (QID) | INTRAMUSCULAR | Status: DC | PRN

## 2024-10-12 MED ORDER — DOCUSATE SODIUM 100 MG PO CAPS: 100.0000 mg | ORAL_CAPSULE | Freq: Every day | ORAL | Status: DC

## 2024-10-12 MED ORDER — LIDOCAINE 2% (20 MG/ML) 5 ML SYRINGE
INTRAMUSCULAR | Status: DC | PRN
  Administered 2024-10-12: 11:00:00 100 mg via INTRAVENOUS

## 2024-10-12 MED ORDER — SODIUM CHLORIDE 0.9 % IV SOLN: 500.0000 mL | Freq: Once | INTRAVENOUS | Status: DC | PRN

## 2024-10-12 MED ORDER — VASHE WOUND IRRIGATION OPTIME
TOPICAL | Status: DC | PRN
  Administered 2024-10-12: 12:00:00 34 [oz_av]

## 2024-10-12 MED ORDER — METOPROLOL TARTRATE 5 MG/5ML IV SOLN: 2.5000 mg | INTRAVENOUS | Status: DC | PRN

## 2024-10-12 MED ORDER — PHENYLEPHRINE HCL-NACL 20-0.9 MG/250ML-% IV SOLN
INTRAVENOUS | Status: DC | PRN
  Administered 2024-10-12: 11:00:00 40 ug/min via INTRAVENOUS

## 2024-10-12 MED ORDER — PHENOL 1.4 % MT LIQD: 1.0000 | OROMUCOSAL | Status: DC | PRN

## 2024-10-12 MED ORDER — ROCURONIUM BROMIDE 10 MG/ML (PF) SYRINGE
PREFILLED_SYRINGE | INTRAVENOUS | Status: DC | PRN
  Administered 2024-10-12: 11:00:00 60 mg via INTRAVENOUS

## 2024-10-12 MED ORDER — ONDANSETRON HCL 4 MG/2ML IJ SOLN
INTRAMUSCULAR | Status: DC | PRN
  Administered 2024-10-12: 12:00:00 4 mg via INTRAVENOUS

## 2024-10-12 MED ORDER — EZETIMIBE 10 MG PO TABS
10.0000 mg | ORAL_TABLET | Freq: Every day | ORAL | Status: DC
  Administered 2024-10-12 – 2024-10-13 (×2): 10 mg via ORAL
  Filled 2024-10-12 (×2): qty 1

## 2024-10-12 MED ORDER — ACETAMINOPHEN 500 MG PO TABS
1000.0000 mg | ORAL_TABLET | Freq: Once | ORAL | Status: DC
  Filled 2024-10-12: qty 2

## 2024-10-12 MED ORDER — PHENYLEPHRINE 80 MCG/ML (10ML) SYRINGE FOR IV PUSH (FOR BLOOD PRESSURE SUPPORT)
PREFILLED_SYRINGE | INTRAVENOUS | Status: DC | PRN
  Administered 2024-10-12 (×2): 160 ug via INTRAVENOUS

## 2024-10-12 SURGICAL SUPPLY — 42 items
BAG COUNTER SPONGE SURGICOUNT (BAG) ×2 IMPLANT
BNDG ELASTIC 4X5.8 VLCR STR LF (GAUZE/BANDAGES/DRESSINGS) IMPLANT
BNDG ELASTIC 6INX 5YD STR LF (GAUZE/BANDAGES/DRESSINGS) IMPLANT
BNDG GAUZE DERMACEA FLUFF 4 (GAUZE/BANDAGES/DRESSINGS) IMPLANT
CANISTER SUCTION 3000ML PPV (SUCTIONS) ×2 IMPLANT
CANISTER WOUNDNEG PRESSURE 500 (CANNISTER) IMPLANT
CLEANSER WND VASHE INSTL 34OZ (WOUND CARE) IMPLANT
COVER SURGICAL LIGHT HANDLE (MISCELLANEOUS) ×2 IMPLANT
DRAPE HALF SHEET 40X57 (DRAPES) IMPLANT
DRAPE INCISE IOBAN 66X45 STRL (DRAPES) ×2 IMPLANT
DRAPE SURG ORHT 6 SPLT 77X108 (DRAPES) ×2 IMPLANT
DRAPE U-SHAPE 76X120 STRL (DRAPES) IMPLANT
DRSG VAC GRANUFOAM LG (GAUZE/BANDAGES/DRESSINGS) ×4 IMPLANT
DRSG VAC GRANUFOAM MED (GAUZE/BANDAGES/DRESSINGS) IMPLANT
DRSG VAC GRANUFOAM SM (GAUZE/BANDAGES/DRESSINGS) IMPLANT
ELECTRODE REM PT RTRN 9FT ADLT (ELECTROSURGICAL) ×2 IMPLANT
GAUZE SPONGE 4X4 12PLY STRL (GAUZE/BANDAGES/DRESSINGS) ×2 IMPLANT
GAUZE XEROFORM 5X9 LF (GAUZE/BANDAGES/DRESSINGS) IMPLANT
GLOVE BIO SURGEON STRL SZ7.5 (GLOVE) ×2 IMPLANT
GLOVE BIOGEL PI IND STRL 8 (GLOVE) ×2 IMPLANT
GLOVE SURG SS PI 7.5 STRL IVOR (GLOVE) ×2 IMPLANT
GOWN STRL REUS W/ TWL LRG LVL3 (GOWN DISPOSABLE) ×4 IMPLANT
GOWN STRL REUS W/ TWL XL LVL3 (GOWN DISPOSABLE) ×4 IMPLANT
GRAFT SKIN WND MICRO 38 (Tissue) IMPLANT
KIT BASIN OR (CUSTOM PROCEDURE TRAY) ×2 IMPLANT
KIT TURNOVER KIT B (KITS) ×2 IMPLANT
PACK CV ACCESS (CUSTOM PROCEDURE TRAY) IMPLANT
PACK GENERAL/GYN (CUSTOM PROCEDURE TRAY) ×2 IMPLANT
PACK UNIVERSAL I (CUSTOM PROCEDURE TRAY) ×2 IMPLANT
PAD ARMBOARD POSITIONER FOAM (MISCELLANEOUS) ×4 IMPLANT
SET HNDPC FAN SPRY TIP SCT (DISPOSABLE) IMPLANT
SOL .9 NS 3000ML IRR UROMATIC (IV SOLUTION) ×2 IMPLANT
SOLN 0.9% NACL POUR BTL 1000ML (IV SOLUTION) ×2 IMPLANT
SOLN STERILE WATER BTL 1000 ML (IV SOLUTION) ×2 IMPLANT
STAPLER SKIN PROX 35W (STAPLE) IMPLANT
SURGIFLO W/THROMBIN 8M KIT (HEMOSTASIS) IMPLANT
SUT ETHILON 2 0 PSLX (SUTURE) IMPLANT
SUT ETHILON 3 0 PS 1 (SUTURE) IMPLANT
SUT VIC AB 2-0 CTX 36 (SUTURE) IMPLANT
SUT VIC AB 3-0 SH 27X BRD (SUTURE) IMPLANT
SUT VIC AB 4-0 PS2 18 (SUTURE) IMPLANT
TOWEL GREEN STERILE (TOWEL DISPOSABLE) ×2 IMPLANT

## 2024-10-12 NOTE — Anesthesia Postprocedure Evaluation (Signed)
 Anesthesia Post Note  Patient: Manuel Richardson  Procedure(s) Performed: IRRIGATION AND DEBRIDEMENT RIGHT GROIN WOUND (Right: Groin) APPLICATION, WOUND VAC (Right: Groin) APPLICATION, SKIN SUBSTITUTE USING 38 [address] C[address]IM[address]ERS KERECIS SURGICLOSE. (Right)     Patient location during evaluation: PACU Anesthesia Type: General Level of consciousness: awake Pain management: pain level controlled Vital Signs Assessment: post-procedure vital signs reviewed and stable Respiratory status: spontaneous breathing, nonlabored ventilation and respiratory function stable Cardiovascular status: blood pressure returned to baseline and stable Postop Assessment: no apparent nausea or vomiting Anesthetic complications: no   There were no known notable events for this encounter.  Last Vitals:  Vitals:   10/12/24 1330 10/12/24 1345  BP: (!) 118/58 125/65  Pulse: 63 65  Resp: (!) 9 14  Temp:    SpO2: 92% 92%    Last Pain:  Vitals:   10/12/24 1345  TempSrc:   PainSc: 4                  [name]

## 2024-10-12 NOTE — Interval H&P Note (Signed)
 History and Physical Interval Note:  10/12/2024 9:47 AM  Manuel Richardson  has presented today for surgery, with the diagnosis of Right Groin Wound.  The various methods of treatment have been discussed with the patient and family. After consideration of risks, benefits and other options for treatment, the patient has consented to  Procedures: IRRIGATION AND DEBRIDEMENT WOUND (Right) APPLICATION, WOUND VAC (Right) CLOSURE, WOUND, USING MUSCLE FLAP (Right) as a surgical intervention.  The patient's history has been reviewed, patient examined, no change in status, stable for surgery.  I have reviewed the patient's chart and labs.  Questions were answered to the patient's satisfaction.     [place]

## 2024-10-12 NOTE — Transfer of Care (Signed)
 Immediate Anesthesia Transfer of Care Note  Patient: Manuel Richardson  Procedure(s) Performed: IRRIGATION AND DEBRIDEMENT RIGHT GROIN WOUND (Right: Groin) APPLICATION, WOUND VAC (Right: Groin) APPLICATION, SKIN SUBSTITUTE USING 38 SQU[address] CENTIMETERS KERECIS SURGICLOSE. (Right)  Patient Location: PACU  Anesthesia Type:General  Level of Consciousness: awake, alert , and oriented  Airway & Oxygen  Therapy: Patient Spontanous Breathing and Patient connected to nasal cannula oxygen   Post-op Assessment: Report given to RN and Post -op Vital signs reviewed and stable  Post vital signs: Reviewed and stable  Last Vitals:  Vitals Value Taken Time  BP 116/72 10/12/24 12:21  Temp 36.4 C 10/12/24 12:20  Pulse 74 10/12/24 12:29  Resp 14 10/12/24 12:29  SpO2 88 % 10/12/24 12:29  Vitals shown include unfiled device data.  Last Pain:  Vitals:   10/12/24 1220  TempSrc:   PainSc: 4          Complications: No notable events documented.

## 2024-10-12 NOTE — Progress Notes (Signed)
 Patient arrived from pacu VSS alert and oriented x 4 CCMD notifed, CHG bath completed, Bed in lowest position with call light in reach current plan of care in progress

## 2024-10-12 NOTE — Anesthesia Procedure Notes (Signed)
 Procedure Name: Intubation Date/Time: 10/12/2024 11:26 AM  Performed by: Scherrie Mast, CRNAPre-anesthesia Checklist: Patient identified, Emergency Drugs available, Suction available and Patient being monitored Patient Re-evaluated:Patient Re-evaluated prior to induction Oxygen  Delivery Method: Circle System Utilized Preoxygenation: Pre-oxygenation with 100% oxygen  Induction Type: IV induction Ventilation: Mask ventilation without difficulty Laryngoscope Size: Mac and 4 Grade View: Grade I Tube type: Oral Tube size: 7.0 mm Number of attempts: 1 Airway Equipment and Method: Stylet and Oral airway Placement Confirmation: ETT inserted through vocal cords under direct vision, positive ETCO2 and breath sounds checked- equal and bilateral Secured at: 21 cm Tube secured with: Tape Dental Injury: Teeth and Oropharynx as per pre-operative assessment

## 2024-10-12 NOTE — Op Note (Signed)
° ° °  Patient name: Manuel Richardson MRN: [id number] DOB: [birth year] Sex: male  10/12/2024 Pre-operative Diagnosis: Drainage right groin postsurgical wound Post-operative diagnosis:  Same Surgeon:  Penne C. Sheree, MD Assistant: Adina Sender, PA Procedure Performed: 1.  Sharp excisional debridement right groin postsurgical wound skin and subcutaneous tissue total 4 x 3 x 2 cm 2.  Washout right groin wound with Pulsavac using 1 L saline and 1 L of Vashe 3.  Application 38 cm Kerecis fish skin 4.  Application negative pressure dressing total 4 x 2 x 1 cm   Indications: 68 year old male has undergone AUI with left to right femorofemoral using dacron now with drainage from the right groin.  He is indicated for washout of the groin with debridement and possible partial closure versus wound VAC placement.  Experience assistant was necessary to facilitate exposure of the contents of the right groin as well as washout and placing wound VAC.  Findings: The dacryon graft was not incorporated but the common femoral artery was incorporated on the right.  The bilateral groin incisions appear to communicate.  The wound was thoroughly washed out there was no evidence of infection and fish skin was placed followed by partial closure over the graft and a wound VAC was placed at the skin and subcutaneous tissue level.   Procedure:  The patient was identified in the holding area and taken to the operating room where he was placed supine operative table and general anesthesia was induced.  He was sterilely prepped and draped in the right groin in the usual fashion, antibiotics were administered a timeout was called.  We began by excising the unhealthy skin with a punctate hole.  We dissected down identified what appeared to be a communication deeper.  There was no evidence of infection.  We encountered fibrinous exudate this was all removed and sharply excised using cautery and 10 blade.  We dissected back to healthy  tissue.  We then thoroughly irrigated using 1 L of saline and 1 L of Vashe with a Pulsavac.  I was also able to irrigate across the entire left to right femoral to femoral graft.  We then placed 38 cm of for skin and closed subcutaneous tissue with interrupted Vicryl suture overlying the graft and a wound VAC was then fashioned to a total of 4 x 2 x 1 cm and placed to -125 mm of suction.  Patient was then awakened from anesthesia having tolerated the procedure without Ameeth complication.  All counts were correct at completion.  EBL: 20 cc    Ludell Zacarias C. Sheree, MD [facility] [place] Office: [phone] Pager: [phone]

## 2024-10-13 ENCOUNTER — Encounter (HOSPITAL_COMMUNITY): Payer: Self-pay | Admitting: Vascular Surgery

## 2024-10-13 ENCOUNTER — Other Ambulatory Visit (HOSPITAL_COMMUNITY): Payer: Self-pay

## 2024-10-13 DIAGNOSIS — Z9889 Other specified postprocedural states: Secondary | ICD-10-CM

## 2024-10-13 DIAGNOSIS — T8189XA Other complications of procedures, not elsewhere classified, initial encounter: Secondary | ICD-10-CM | POA: Diagnosis not present

## 2024-10-13 LAB — CBC
HCT: 39.1 % (ref 39.0–52.0)
Hemoglobin: 12.9 g/dL — ABNORMAL LOW (ref 13.0–17.0)
MCH: 30.5 pg (ref 26.0–34.0)
MCHC: 33 g/dL (ref 30.0–36.0)
MCV: 92.4 fL (ref 80.0–100.0)
Platelets: 146 K/uL — ABNORMAL LOW (ref 150–400)
RBC: 4.23 MIL/uL (ref 4.22–5.81)
RDW: 13.4 % (ref 11.5–15.5)
WBC: 6.3 K/uL (ref 4.0–10.5)
nRBC: 0 % (ref 0.0–0.2)

## 2024-10-13 LAB — LIPID PANEL
Cholesterol: 104 mg/dL (ref 0–200)
HDL: 42 mg/dL (ref >40)
LDL Cholesterol: 44 mg/dL (ref 0–99)
Total CHOL/HDL Ratio: 2.5 ratio
Triglycerides: 87 mg/dL (ref <150)
VLDL: 17 mg/dL (ref 0–40)

## 2024-10-13 LAB — BASIC METABOLIC PANEL WITH GFR
Anion gap: 7 (ref 5–15)
BUN: 24 mg/dL — ABNORMAL HIGH (ref 8–23)
CO2: 25 mmol/L (ref 22–32)
Calcium: 8.6 mg/dL — ABNORMAL LOW (ref 8.9–10.3)
Chloride: 105 mmol/L (ref 98–111)
Creatinine, Ser: 1.25 mg/dL — ABNORMAL HIGH (ref 0.61–1.24)
GFR, Estimated: >60 mL/min (ref >60)
Glucose, Bld: 113 mg/dL — ABNORMAL HIGH (ref 70–99)
Potassium: 4.8 mmol/L (ref 3.5–5.1)
Sodium: 136 mmol/L (ref 135–145)

## 2024-10-13 MED ORDER — APIXABAN 5 MG PO TABS
5.0000 mg | ORAL_TABLET | Freq: Two times a day (BID) | ORAL | 11 refills | Status: AC
  Filled 2024-10-13: qty 60, 30d supply, fill #0

## 2024-10-13 MED ORDER — OXYCODONE-ACETAMINOPHEN 5-325 MG PO TABS
1.0000 | ORAL_TABLET | Freq: Four times a day (QID) | ORAL | 0 refills | Status: AC | PRN
  Filled 2024-10-13: qty 20, 5d supply, fill #0

## 2024-10-13 MED ORDER — DOXYCYCLINE HYCLATE 100 MG PO TABS
100.0000 mg | ORAL_TABLET | Freq: Two times a day (BID) | ORAL | 0 refills | Status: DC
  Filled 2024-10-13: qty 28, 14d supply, fill #0

## 2024-10-13 NOTE — Care Management CC44 (Signed)
 Condition Code 44 Documentation Completed  Patient Details  Name: CHRIS CRIPPS MRN: [id number] Date of Birth: [birth year]   Condition Code 44 given:  Yes Patient signature on Condition Code 44 notice:  Yes Documentation of 2 MD's agreement:  Yes Code 44 added to claim:  Yes    Charlann [name], RN 10/13/2024, 2:41 PM

## 2024-10-13 NOTE — Progress Notes (Addendum)
°  Progress Note    10/13/2024 6:48 AM 1 Day Post-Op  Subjective:  no complaints this morning  Afebrile HR 60's-80's NSR 120's-130's systolic 92% Cutter  Vitals:   10/13/24 0327 10/13/24 0330  BP:  129/63  Pulse: 67   Resp: 15   Temp:  97.6 F (36.4 C)  SpO2: 92%     Physical Exam: General:  sleeping, wakes easily Lungs:  non labored Incisions:  right groin with vac with good seal Extremities:  right foot is warm and well perfused.     CBC    Component Value Date/Time   WBC 6.3 10/13/2024 0325   RBC 4.23 10/13/2024 0325   HGB 12.9 (L) 10/13/2024 0325   HGB 14.4 01/19/2024 1607   HCT 39.1 10/13/2024 0325   HCT 42.7 01/19/2024 1607   PLT 146 (L) 10/13/2024 0325   PLT 187 01/19/2024 1607   MCV 92.4 10/13/2024 0325   MCV 91 01/19/2024 1607   MCH 30.5 10/13/2024 0325   MCHC 33.0 10/13/2024 0325   RDW 13.4 10/13/2024 0325   RDW 13.2 01/19/2024 1607   LYMPHSABS 0.6 (L) 04/01/2021 0145   MONOABS 0.4 04/01/2021 0145   EOSABS 0.0 04/01/2021 0145   BASOSABS 0.0 04/01/2021 0145    BMET    Component Value Date/Time   NA 136 10/13/2024 0325   NA 140 01/19/2024 1607   K 4.8 10/13/2024 0325   CL 105 10/13/2024 0325   CO2 25 10/13/2024 0325   GLUCOSE 113 (H) 10/13/2024 0325   BUN 24 (H) 10/13/2024 0325   BUN 13 01/19/2024 1607   CREATININE 1.25 (H) 10/13/2024 0325   CREATININE 0.83 03/30/2012 0921   CALCIUM  8.6 (L) 10/13/2024 0325   GFRNONAA >60 10/13/2024 0325   GFRAA  11/02/2010 0645    >60        The eGFR has been calculated using the MDRD equation. This calculation has not been validated in all clinical situations. eGFR's persistently <60 mL/min signify possible Chronic Kidney Disease.    INR    Component Value Date/Time   INR 1.0 07/06/2024 1400     Intake/Output Summary (Last 24 hours) at 10/13/2024 0648 Last data filed at 10/13/2024 0331 Gross per 24 hour  Intake 1309.17 ml  Output 1100 ml  Net 209.17 ml      Assessment/Plan:  68  y.o. male is s/p:  Sharp excisional debridement right groin, washout right groin, application fish skin and vac placement 10/12/2024 by Dr. Sheree  1 Day Post-Op   -vac with good seal with 30cc output.   -will need HHRN and vac.  TOC consulted yesterday.  Wound measurements in op note.  -DVT prophylaxis:  sq heparin    Lucie Apt, PA-C [facility] [phone] 10/13/2024 6:48 AM   I agree with the above.  I have seen and evaluated the patient.  We are planning for discharge home today with home health to [facility] change on Friday.  He will come back to the office on Monday.  He will be discharged home on doxycycline .  Malvina New

## 2024-10-13 NOTE — Progress Notes (Signed)
 Patient's wound vac dressing supplies delivered at bedside, wound vac machine switched to portable home VAC, was working well, dressing checked and reinforced. Patient is receiving d/c education from d/c RN, patient to pick up TOC medication on the way out.

## 2024-10-13 NOTE — Evaluation (Signed)
 Physical Therapy Evaluation Patient Details Name: Manuel Richardson MRN: [id number] DOB: 07/21/1956 Today's Date: 10/13/2024  History of Present Illness  Pt is a 68 y.o male admitted 12/15 for drainage from R groin incision. S/p I & D 12/16. PMH: carotid occlusion, COPD, HLD, HTN, s/p bil fem endartectomy  Clinical Impression  PTA pt was independent for mobility with no AD. Pt reported being at baseline for mobility being independent for bed mobility and ModI for gait and stair negotiation. Able to ascend/descend 3 steps with one handrail and cueing for technique. One standing rest break while ambulating due to SpO2 <88%. Pt required bump to 3L to maintain SpO2 >88%, vitals below. Discussed taking frequent standing/seated rest breaks with monitoring of SpO2 on pulse oximeter at home. Also discussed walking program with pt verbalizing understanding. Pt will have intermittent assist available upon d/c home. No further acute or post-acute PT needs identified with acute PT signing off.     85% SpO2 on RA  92% on 3L during gait 95% on 2L after mobility      If plan is discharge home, recommend the following: Assist for transportation   Can travel by private vehicle    Yes    Equipment Recommendations None recommended by PT     Functional Status Assessment Patient has not had a recent decline in their functional status     Precautions / Restrictions Precautions Precautions: Fall Precaution/Restrictions Comments: R groin wound vac, watch O2 Restrictions Weight Bearing Restrictions Per Provider Order: No      Mobility  Bed Mobility Overal bed mobility: Independent   Transfers Overall transfer level: Independent Equipment used: None   Ambulation/Gait Ambulation/Gait assistance: Modified independent (Device/Increase time) Gait Distance (Feet): 150 Feet Assistive device: None Gait Pattern/deviations: Step-through pattern, Decreased stride length Gait velocity: slightly decreased      General Gait Details: steady gait with no UE support. One standing rest break due to SpO2 85% on 2L. Required bump to 3L with 92% SpO2  Stairs Stairs: Yes Stairs assistance: Modified independent (Device/Increase time) Stair Management: One rail Left, Alternating pattern, Forwards Number of Stairs: 3 General stair comments: educated on technique with LLE leading and R LE to descend     Balance Overall balance assessment: Needs assistance Sitting-balance support: No upper extremity supported, Feet supported Sitting balance-Leahy Scale: Normal     Standing balance support: No upper extremity supported Standing balance-Leahy Scale: Good        Pertinent Vitals/Pain Pain Assessment Pain Assessment: Faces Faces Pain Scale: Hurts little more Pain Location: R goin with movement Pain Descriptors / Indicators: Discomfort Pain Intervention(s): Monitored during session, Limited activity within patient's tolerance, Repositioned    Home Living Family/patient expects to be discharged to:: Private residence Living Arrangements: Non-relatives/Friends Available Help at Discharge: Family;Available PRN/intermittently Type of Home: Mobile home Home Access: Stairs to enter Entrance Stairs-Rails: Doctor, General Practice of Steps: 4   Home Layout: One level Home Equipment: None      Prior Function Prior Level of Function : Independent/Modified Independent    Mobility Comments: Reports no AD, denies any falls ADLs Comments: Ind     Extremity/Trunk Assessment   Upper Extremity Assessment Upper Extremity Assessment: Defer to OT evaluation    Lower Extremity Assessment Lower Extremity Assessment: RLE deficits/detail RLE Deficits / Details: WFL ROM, MMT deferred 2/2 pain. At least 3/5 RLE Sensation: WNL    Cervical / Trunk Assessment Cervical / Trunk Assessment: Normal  Communication   Communication Communication: No apparent difficulties  Cognition Arousal:  Alert Behavior During Therapy: WFL for tasks assessed/performed   PT - Cognitive impairments: No apparent impairments    Following commands: Intact       Cueing Cueing Techniques: Verbal cues      PT Assessment Patient does not need any further PT services         PT Goals (Current goals can be found in the Care Plan section)  Acute Rehab PT Goals PT Goal Formulation: All assessment and education complete, DC therapy     AM-PAC PT 6 Clicks Mobility  Outcome Measure Help needed turning from your back to your side while in a flat bed without using bedrails?: None Help needed moving from lying on your back to sitting on the side of a flat bed without using bedrails?: None Help needed moving to and from a bed to a chair (including a wheelchair)?: None Help needed standing up from a chair using your arms (e.g., wheelchair or bedside chair)?: None Help needed to walk in hospital room?: None Help needed climbing 3-5 steps with a railing? : None 6 Click Score: 24    End of Session Equipment Utilized During Treatment: Gait belt;Oxygen  Activity Tolerance: Patient tolerated treatment well Patient left: in bed;with call bell/phone within reach;with family/visitor present Nurse Communication: Mobility status;Other (comment) (SpO2) PT Visit Diagnosis: Other abnormalities of gait and mobility (R26.89)    Time: 9190-9171 PT Time Calculation (min) (ACUTE ONLY): 19 min   Charges:   PT Evaluation $PT Eval Low Complexity: 1 Low   PT General Charges $$ ACUTE PT VISIT: 1 Visit       Kate ORN, PT, DPT Secure Chat Preferred  Rehab Office [phone]   Kate BRAVO Wendolyn 10/13/2024, 8:59 AM

## 2024-10-13 NOTE — Evaluation (Signed)
 Occupational Therapy Evaluation Patient Details Name: Manuel Richardson MRN: [id number] DOB: 08-Nov-1955 Today's Date: 10/13/2024   History of Present Illness   Pt is a 68 y.o male admitted 12/15 for drainage from R groin incision. S/p I & D 12/16. PMH: carotid occlusion, COPD, HLD, HTN, s/p bil fem endartectomy     Clinical Impressions Pt admitted based on above, and was seen based on problem list below. PTA pt was independent with ADLs and IADLs. Today pt is mod I for ADLs. Pt able to complete UB/LB dressing with increased time and effort. Pt requiring 2L O2 for activity, destats to 87%, but recovers with rest break. Educated pt on compensatory strategies for dressing, as well as energy conservation techniques, pt verbalized understanding. Pt and wife reporting no concerns for [name], no follow up OT needs. All education complete, no further acute OT needs.    If plan is discharge home, recommend the following:   Assistance with cooking/housework;Assist for transportation     Functional Status Assessment   Patient has had a recent decline in their functional status and/or demonstrates limited ability to make significant improvements in function in a reasonable and predictable amount of time     Equipment Recommendations   None recommended by OT      Precautions/Restrictions   Precautions Precautions: Fall Recall of Precautions/Restrictions: Intact Precaution/Restrictions Comments: R groin wound vac, watch O2 Restrictions Weight Bearing Restrictions Per Provider Order: No     Mobility Bed Mobility Overal bed mobility: Independent      Transfers Overall transfer level: Independent Equipment used: None        Balance Overall balance assessment: Needs assistance Sitting-balance support: No upper extremity supported, Feet supported Sitting balance-Leahy Scale: Normal     Standing balance support: No upper extremity supported Standing balance-Leahy Scale: Good      ADL either performed or assessed with clinical judgement   ADL Overall ADL's : Modified independent     General ADL Comments: Increased time and effort, but able to complete UB and LB dressing, as well as functional transfers     Vision Baseline Vision/History: 0 No visual deficits Vision Assessment?: No apparent visual deficits            Pertinent Vitals/Pain Pain Assessment Pain Assessment: Faces Faces Pain Scale: Hurts little more Pain Location: R goin with movement Pain Descriptors / Indicators: Discomfort Pain Intervention(s): Monitored during session     Extremity/Trunk Assessment Upper Extremity Assessment Upper Extremity Assessment: Overall WFL for tasks assessed   Lower Extremity Assessment Lower Extremity Assessment: Defer to PT evaluation   Cervical / Trunk Assessment Cervical / Trunk Assessment: Normal   Communication Communication Communication: No apparent difficulties   Cognition Arousal: Alert Behavior During Therapy: WFL for tasks assessed/performed Cognition: No apparent impairments     Following commands: Intact       Cueing  General Comments   Cueing Techniques: Verbal cues  destat to 87 on 2L with activity. With rest break recovers to 91%           Home Living Family/patient expects to be discharged to:: Private residence Living Arrangements: Non-relatives/Friends Available Help at Discharge: Family;Available PRN/intermittently Type of Home: Mobile home Home Access: Stairs to enter Entrance Stairs-Number of Steps: 4 Entrance Stairs-Rails: Right;Left Home Layout: One level     Bathroom Shower/Tub: Chief Strategy Officer: Standard Bathroom Accessibility: Yes   Home Equipment: None          Prior Functioning/Environment Prior Level of  Function : Independent/Modified Independent             Mobility Comments: Reports no AD, denies any falls ADLs Comments: Ind    OT Problem List: Pain         OT Goals(Current goals can be found in the care plan section)   Acute Rehab OT Goals Patient Stated Goal: To go home OT Goal Formulation: All assessment and education complete, DC therapy Time For Goal Achievement: 10/27/24 Potential to Achieve Goals: Good   AM-PAC OT 6 Clicks Daily Activity     Outcome Measure Help from another person eating meals?: None Help from another person taking care of personal grooming?: None Help from another person toileting, which includes using toliet, bedpan, or urinal?: None Help from another person bathing (including washing, rinsing, drying)?: None Help from another person to put on and taking off regular upper body clothing?: None Help from another person to put on and taking off regular lower body clothing?: None 6 Click Score: 24   End of Session Equipment Utilized During Treatment: Oxygen  Nurse Communication: Mobility status  Activity Tolerance: Patient tolerated treatment well Patient left: in chair;with call bell/phone within reach;with family/visitor present  OT Visit Diagnosis: Pain Pain - Right/Left: Right Pain - part of body: Leg                Time: 8746-8690 OT Time Calculation (min): 16 min Charges:  OT General Charges $OT Visit: 1 Visit OT Evaluation $OT Eval Moderate Complexity: 1 Mod  Manuel Richardson, OT  Acute Rehabilitation Services Office [phone] Secure chat preferred   Manuel Richardson Savers 10/13/2024, 1:24 PM

## 2024-10-13 NOTE — Progress Notes (Signed)
 Reviewed AVS, patient expressed understanding of medications, MD follow up reviewed.   Removed IV, Site clean, dry and intact.  See LDA for information on wounds at discharge.  Patient states all belongings brought to the hospital at time of admission are accounted for and packed to take home.  Picked up medications from [facility] pharmacy.   [facility] contacted to transport patient to entrance A, where family member was waiting in vehicle to transport home.

## 2024-10-13 NOTE — Care Management Obs Status (Signed)
 MEDICARE OBSERVATION STATUS NOTIFICATION   Patient Details  Name: Manuel Richardson MRN: [id number] Date of Birth: [birth year]   Medicare Observation Status Notification Given:  Yes    Charlann [name], RN 10/13/2024, 2:41 PM

## 2024-10-13 NOTE — TOC Transition Note (Signed)
 Transition of Care (TOC) - Discharge Note Rayfield Gobble RN, BSN Inpatient Care Management Unit 4E- RN Case Manager See Treatment Team for direct phone #    Patient Details  Name: Manuel Richardson MRN: [id number] Date of Birth: 06-09-1956  Transition of Care [facility]) CM/SW Contact:  Manuel [name], RN Phone Number: 10/13/2024, 2:52 PM   Clinical Narrative:    CM notified on 12/16 from [facility] that pt will need home VAC and HHRN- pt was in OR for washout. CM reached out to [employer] liaison for home [facility] however pt's insurance is out of network with [employer]- [facility] has contract for DME w/ pt's insurance. Paper order was signed for [facility] and faxed to start insurance process.  CM also reached out to [facility] liaison to check [facility] office referrals to see if pt on list- and to check to see if they can service pt for home [facility] needs- referral pending final approval on 12/16.    Pt stable for discharge home today pending home VAC approval.   [facility] has sent msg that order form needs updating- order form to be emailed to this CM with updated info- will need to get re-signed and then faxed back to [employer] for approval process- once approved liaison will deliver home VAC to the bedside.  Updated order has been sent to [employer].   CM spoke with [facility] liaison - [facility]- referral has been accepted for home VAC needs- updated on plan for pt to see [facility] on 12/22- first home visit needed end of next week.   Spoke with pt at bedside to discuss transition needs- explained pending home VAC approval with [employer] per insurance contract, as well as [facility] referral w/ [facility]. Pt voiced he did not have a preference for HH as long as they could provide him what he needed- he is agreeable to [facility].   1500- received msg from [employer] that home VAC has been approved- and liaison will deliver to bedside within the next 30 min. Bedside RN updated and will need to change pt over to home VAC prior to leaving.   [facility] will  contact pt to schedule next weeks visit.   IP CM interventions have been completed no further needs noted.   Final next level of care: Home w Home Health Services Barriers to Discharge: Equipment Delay (waiting for home [facility] approval and delivery)   Patient Goals and CMS Choice Patient states their goals for this hospitalization and ongoing recovery are:: return home   Choice offered to / list presented to : Patient      Discharge Placement               Home w/ [facility]        Discharge Plan and Services Additional resources added to the After Visit Summary for     Discharge Planning Services: CM Consult Post Acute Care Choice: Durable Medical Equipment, Home Health          DME Arranged: Vac DME Agency: [facility] Date DME Agency Contacted: 10/12/24 Time DME Agency Contacted: 1400 Representative spoke with at DME Agency: [facility] HH Arranged: RN HH Agency: [facility] Date HH Agency Contacted: 10/12/24 Time HH Agency Contacted: 1400 Representative spoke with at [facility] Agency: [facility]  Social Drivers of Health (SDOH) Interventions SDOH Screenings   Food Insecurity: No Food Insecurity (07/08/2024)  Housing: Low Risk (07/08/2024)  Transportation Needs: No Transportation Needs (07/08/2024)  Utilities: Not At Risk (07/08/2024)  Depression (PHQ2-9): Low Risk (02/23/2024)  Social Connections: Unknown (07/08/2024)  Tobacco Use: Medium Risk (10/12/2024)     Readmission Risk Interventions    10/13/2024    2:52 PM  Readmission Risk Prevention Plan  Transportation Screening Complete  PCP or Specialist Appt within 5-7 Days Complete  Home Care Screening Complete  Medication Review (RN CM) Complete

## 2024-10-13 NOTE — Progress Notes (Signed)
 Manuel Richardson                                          MRN: [id number]   10/13/2024   The [facility] Quality Team Specialist reviewed this patient medical record for the purposes of chart review for care gap closure. The following were reviewed: chart review for care gap closure-kidney health evaluation for diabetes:eGFR  and uACR.    [facility] Quality Team

## 2024-10-13 NOTE — Discharge Summary (Incomplete)
 Discharge Summary    Manuel Richardson [birth year] 68 y.o. male  [id number]  Admission Date: 10/12/2024  Discharge Date: 10/14/2024  Physician: No att. providers found  Admission Diagnosis: Non-healing surgical wound, initial encounter [T81.89XA] Nonhealing surgical wound [T81.89XA]   HPI:   This is a 68 y.o. male has undergone AUI with left to right femorofemoral using dacron now with drainage from the right groin. He is indicated for washout of the groin with debridement and possible partial closure versus wound VAC placement   Hospital Course:  The patient was admitted to the hospital and taken to the operating room on 10/12/2024 and underwent: 1.  Sharp excisional debridement right groin postsurgical wound skin and subcutaneous tissue total 4 x 3 x 2 cm 2.  Washout right groin wound with Pulsavac using 1 L saline and 1 L of Vashe 3.  Application 38 cm Kerecis fish skin 4.  Application negative pressure dressing total 4 x 2 x 1 cm    The pt tolerated the procedure well and was transported to the PACU in good condition.   Pt doing well.  Plan to discharge home with vac and for him to return to office on Monday for vac change and HH will start HH vac changes the following Friday.  He is discharged home on Doxycycline .    CBC    Component Value Date/Time   WBC 6.3 10/13/2024 0325   RBC 4.23 10/13/2024 0325   HGB 12.9 (L) 10/13/2024 0325   HGB 14.4 01/19/2024 1607   HCT 39.1 10/13/2024 0325   HCT 42.7 01/19/2024 1607   PLT 146 (L) 10/13/2024 0325   PLT 187 01/19/2024 1607   MCV 92.4 10/13/2024 0325   MCV 91 01/19/2024 1607   MCH 30.5 10/13/2024 0325   MCHC 33.0 10/13/2024 0325   RDW 13.4 10/13/2024 0325   RDW 13.2 01/19/2024 1607   LYMPHSABS 0.6 (L) 04/01/2021 0145   MONOABS 0.4 04/01/2021 0145   EOSABS 0.0 04/01/2021 0145   BASOSABS 0.0 04/01/2021 0145    BMET    Component Value Date/Time   NA 136 10/13/2024 0325   NA 140 01/19/2024 1607   K 4.8  10/13/2024 0325   CL 105 10/13/2024 0325   CO2 25 10/13/2024 0325   GLUCOSE 113 (H) 10/13/2024 0325   BUN 24 (H) 10/13/2024 0325   BUN 13 01/19/2024 1607   CREATININE 1.25 (H) 10/13/2024 0325   CREATININE 0.83 03/30/2012 0921   CALCIUM  8.6 (L) 10/13/2024 0325   GFRNONAA >60 10/13/2024 0325   GFRAA  11/02/2010 0645    >60        The eGFR has been calculated using the MDRD equation. This calculation has not been validated in all clinical situations. eGFR's persistently <60 mL/min signify possible Chronic Kidney Disease.      Discharge Instructions     Discharge patient   Complete by: As directed    Discharge home once TOC has completed HH and wound vac needs.  Discharge meds sent to [facility].  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 07/07/2024   AAA (abdominal aortic aneurysm) 07/07/2024   Angina pectoris 01/29/2024  Pre-op evaluation 01/05/2024   Coronary artery calcification 11/10/2023   Exertional dyspnea 11/04/2023   Atrial fibrillation with RVR (HCC) 03/28/2021   COVID-19 virus infection 03/28/2021   PAD (peripheral artery disease) 09/21/2018   Bilateral carotid bruits 09/21/2018   Tobacco use disorder 07/18/2014   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/26/2012   Coronary artery disease 05/26/2012   ED (erectile dysfunction) 03/30/2012   Primary hypertension 03/30/2012   Stage 3 severe COPD by GOLD classification (HCC) 03/30/2012   Annual physical exam 03/05/2011   Hyperlipidemia 03/05/2011   Carotid artery disease 03/05/2011   Past Medical History:  Diagnosis Date   Anginal pain    Carotid  artery occlusion    COPD (chronic obstructive pulmonary disease) (HCC)    Coronary artery disease    Dyspnea    with exertion   Erectile dysfunction    Full dentures    GERD (gastroesophageal reflux disease)    Hyperlipidemia    Hypertension    Peripheral vascular disease    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   amLODipine  5 MG tablet Commonly known as: NORVASC  Take 1 tablet (5 mg total) by mouth daily.   aspirin  EC 325 MG tablet Take 325 mg by mouth See admin instructions. 1-2 times daily   Breztri  Aerosphere 160-9-4.8 MCG/ACT Aero inhaler Generic drug: budesonide -glycopyrrolate -formoterol  Inhale 2 puffs into the lungs 2 (two) times daily.   Breztri  Aerosphere 160-9-4.8 MCG/ACT Aero inhaler Generic drug: budesonide -glycopyrrolate -formoterol  Inhale 2 puffs into the lungs in the morning and at bedtime.   clopidogrel  75 MG tablet Commonly known as: PLAVIX  Take 1 tablet (75 mg total) by mouth daily.   doxycycline  100 MG tablet Commonly known as: VIBRA -TABS Take 1 tablet (100 mg total) by mouth 2 (two) times daily. Replaces: doxycycline  100 MG capsule   Eliquis  5 MG Tabs tablet Generic drug: apixaban  Take 1 tablet (5 mg total) by mouth 2 (two) times daily.   ezetimibe  10 MG tablet Commonly known as: ZETIA  Take 1 tablet (10 mg total) by mouth daily.   losartan  50 MG tablet Commonly known as: COZAAR  TAKE 1 TABLET BY MOUTH EVERY DAY   metoprolol  succinate 50 MG 24 hr  tablet Commonly known as: TOPROL -XL TAKE 1 TABLET BY MOUTH DAILY. TAKE WITH OR IMMEDIATELY FOLLOWING A MEAL.   nitroGLYCERIN  0.4 MG SL tablet Commonly known as: Nitrostat  Place 1 tablet (0.4 mg total) under the tongue every 5 (five) minutes as needed for chest pain.   oxyCODONE -acetaminophen  5-325 MG tablet Commonly known as: Percocet Take 1 tablet by mouth every 6 (six) hours as needed.   ranolazine  500 MG 12 hr tablet Commonly known as: Ranexa  Take 1 tablet (500 mg total) by mouth 2 (two) times daily.   rosuvastatin  40 MG tablet Commonly known as: CRESTOR  Take 1 tablet (40 mg total) by mouth daily.   TUMS PO Take 1 tablet by mouth daily as needed (heartburn).  Prescriptions given: Roxicet #20 No Refill Doxycycline  100mg  bid #28 no refill  Instructions: 1.  Wound vac change every Monday and Friday with first vac change on 10/18/2024 in our office.   Disposition: home with [facility]  Patient's condition: is Good  Follow up: 1. [facility] on 10/18/2024 for vac change then resume HH vac changes on 10/22/2024   Lucie Apt, PA-C [facility] [phone] 10/14/2024  11:53 AM

## 2024-10-15 ENCOUNTER — Ambulatory Visit: Attending: Surgery | Admitting: Physician Assistant

## 2024-10-15 ENCOUNTER — Telehealth: Payer: Self-pay

## 2024-10-15 VITALS — BP 119/74 | HR 103 | Temp 98.1°F | Wt 170.0 lb

## 2024-10-15 DIAGNOSIS — T8189XA Other complications of procedures, not elsewhere classified, initial encounter: Secondary | ICD-10-CM

## 2024-10-15 NOTE — Progress Notes (Signed)
 " POST OPERATIVE OFFICE NOTE    CC:  F/u for surgery  HPI:  This is a 68 y.o. male who was added on to clinic schedule as an urgent triage visit due to malfunctioning right groin wound VAC.  He is status post washout and excisional debridement of right groin postsurgical wound with placement of the wound VAC by Dr. Sheree on 10/12/2024.  He previously underwent endovascular repair of AAA using aorto uni iliac endograft with bilateral femoral endarterectomies and femoral to femoral bypass with Dr. Serene on 07/07/2024.  He was scheduled to have a home health visit today however home health did not have the necessary resources.  He comes to clinic for a wound VAC change.  He denies any fevers, chills, nausea/vomiting.  Allergies[1]  Current Outpatient Medications  Medication Sig Dispense Refill   albuterol  (VENTOLIN  HFA) 108 (90 Base) MCG/ACT inhaler Inhale 2 puffs into the lungs every 6 (six) hours as needed for wheezing or shortness of breath. 8 g 2   amLODipine  (NORVASC ) 5 MG tablet Take 1 tablet (5 mg total) by mouth daily. 90 tablet 3   apixaban  (ELIQUIS ) 5 MG TABS tablet Take 1 tablet (5 mg total) by mouth 2 (two) times daily. 60 tablet 11   aspirin  EC 325 MG tablet Take 325 mg by mouth See admin instructions. 1-2 times daily     BREZTRI  AEROSPHERE 160-9-4.8 MCG/ACT AERO inhaler Inhale 2 puffs into the lungs 2 (two) times daily. 10.7 g 6   budesonide -glycopyrrolate -formoterol  (BREZTRI  AEROSPHERE) 160-9-4.8 MCG/ACT AERO inhaler Inhale 2 puffs into the lungs in the morning and at bedtime. 1 each 0   Calcium  Carbonate Antacid (TUMS PO) Take 1 tablet by mouth daily as needed (heartburn).     clopidogrel  (PLAVIX ) 75 MG tablet Take 1 tablet (75 mg total) by mouth daily. 90 tablet 3   doxycycline  (VIBRA -TABS) 100 MG tablet Take 1 tablet (100 mg total) by mouth 2 (two) times daily. 28 tablet 0   ezetimibe  (ZETIA ) 10 MG tablet Take 1 tablet (10 mg total) by mouth daily. 90 tablet 3   losartan   (COZAAR ) 50 MG tablet TAKE 1 TABLET BY MOUTH EVERY DAY 90 tablet 3   metoprolol  succinate (TOPROL -XL) 50 MG 24 hr tablet TAKE 1 TABLET BY MOUTH DAILY. TAKE WITH OR IMMEDIATELY FOLLOWING A MEAL. 90 tablet 3   nitroGLYCERIN  (NITROSTAT ) 0.4 MG SL tablet Place 1 tablet (0.4 mg total) under the tongue every 5 (five) minutes as needed for chest pain. 25 tablet 3   oxyCODONE -acetaminophen  (PERCOCET) 5-325 MG tablet Take 1 tablet by mouth every 6 (six) hours as needed. 20 tablet 0   ranolazine  (RANEXA ) 500 MG 12 hr tablet Take 1 tablet (500 mg total) by mouth 2 (two) times daily. 180 tablet 2   rosuvastatin  (CRESTOR ) 40 MG tablet Take 1 tablet (40 mg total) by mouth daily. 90 tablet 3   No current facility-administered medications for this visit.     ROS:  See HPI  Physical Exam:  Vitals:   10/15/24 1250  BP: 119/74  Pulse: (!) 103  Temp: 98.1 F (36.7 C)  TempSrc: Temporal  Weight: 170 lb (77.1 kg)    Incision: Right groin surgical wound pictured below; no exposed graft material; serous fluid present in wound bed, no bleeding Extremities: Palpable femorofemoral bypass pulse Neuro: A&O    Assessment/Plan:  This is a 68 y.o. male who is s/p: Excisional debridement and washout of right groin wound after femorofemoral bypass surgery  Patient was  added to clinic schedule as an urgent triage visit due to a malfunctioning wound VAC.  The wound VAC was removed in office and a new VAC was applied.  There was no apparent graft material in the wound bed.  There was serous drainage but no purulence.  We will keep his previously scheduled appointment on Monday as a placeholder in case wound VAC malfunctions again.  He can otherwise return in another 1 to 2 weeks for wound check.  He will notify the office with any questions or concerns in the meantime.   Donnice Sender, PA-C [facility] [phone]  Clinic MD:  [place]     [1] No Known Allergies  "

## 2024-10-15 NOTE — Telephone Encounter (Addendum)
 Pt's sister called reporting pt's wound vac has air around the wound site.  Patient reports the wound vac is leaking.  Called Bernarda, [facility] to request visit with patient today to assess the wound vac. Verbal order given for skilled-nursing care today to assess wound vac malfunction.  Bernarda called back and she has no resources available today for a wound vac check.  Arranged transportation of patient to come in for wound vac check by PA.

## 2024-10-18 ENCOUNTER — Ambulatory Visit

## 2024-11-01 ENCOUNTER — Ambulatory Visit: Attending: Surgery | Admitting: Physician Assistant

## 2024-11-01 VITALS — BP 108/75 | HR 91 | Temp 97.7°F | Wt 170.1 lb

## 2024-11-01 DIAGNOSIS — T8189XA Other complications of procedures, not elsewhere classified, initial encounter: Secondary | ICD-10-CM

## 2024-11-01 NOTE — Progress Notes (Signed)
 " POST OPERATIVE OFFICE NOTE    CC:  F/u for surgery  HPI:  This is a 69 y.o. male who is s/p endovascular repair of AAA using aorto uni iliac endograft, open bilateral femoral artery exposure, bilateral femoral endarterectomy, left femoral artery dacryon patch angioplasty, femorofemoral bypass,  and left common iliac and external iliac artery intra-arterial lithotripsy and angioplasty by Dr. Serene on 07/07/2024.  This was done for treatment of a AAA with severe claudication of the right lower extremity.   He underwent sharp excisional debridement right groin and washout with kerecis fish skin and vac placement on 10/12/2024 by Dr. Sheree.   He has hx of left carotid to left subclavian artery BPG on 11/01/2010 by Dr. Serene.  He was last seen in August 2025 by Dr. Serene and his bypass was patent.  Of note, bilateral ICA occluded.   The pt was seen in clinic on 10/11/2024 and at that time, the right groin was draining and dehisced.  He was scheduled for washout of his groin.   Pt returns today for follow up and here with his sister.  Pt states he has been doing well.  He would like to change to wet to dry dressing and get rid of the vac.  He states his feet feel good.  He denies any fevers.   Pt is on Eliquis  and asa and statin.  Allergies[1]  Current Outpatient Medications  Medication Sig Dispense Refill   albuterol  (VENTOLIN  HFA) 108 (90 Base) MCG/ACT inhaler Inhale 2 puffs into the lungs every 6 (six) hours as needed for wheezing or shortness of breath. 8 g 2   amLODipine  (NORVASC ) 5 MG tablet Take 1 tablet (5 mg total) by mouth daily. 90 tablet 3   apixaban  (ELIQUIS ) 5 MG TABS tablet Take 1 tablet (5 mg total) by mouth 2 (two) times daily. 60 tablet 11   aspirin  EC 325 MG tablet Take 325 mg by mouth See admin instructions. 1-2 times daily     BREZTRI  AEROSPHERE 160-9-4.8 MCG/ACT AERO inhaler Inhale 2 puffs into the lungs 2 (two) times daily. 10.7 g 6   budesonide -glycopyrrolate -formoterol   (BREZTRI  AEROSPHERE) 160-9-4.8 MCG/ACT AERO inhaler Inhale 2 puffs into the lungs in the morning and at bedtime. 1 each 0   Calcium  Carbonate Antacid (TUMS PO) Take 1 tablet by mouth daily as needed (heartburn).     clopidogrel  (PLAVIX ) 75 MG tablet Take 1 tablet (75 mg total) by mouth daily. 90 tablet 3   doxycycline  (VIBRA -TABS) 100 MG tablet Take 1 tablet (100 mg total) by mouth 2 (two) times daily. 28 tablet 0   ezetimibe  (ZETIA ) 10 MG tablet Take 1 tablet (10 mg total) by mouth daily. 90 tablet 3   losartan  (COZAAR ) 50 MG tablet TAKE 1 TABLET BY MOUTH EVERY DAY 90 tablet 3   metoprolol  succinate (TOPROL -XL) 50 MG 24 hr tablet TAKE 1 TABLET BY MOUTH DAILY. TAKE WITH OR IMMEDIATELY FOLLOWING A MEAL. 90 tablet 3   nitroGLYCERIN  (NITROSTAT ) 0.4 MG SL tablet Place 1 tablet (0.4 mg total) under the tongue every 5 (five) minutes as needed for chest pain. 25 tablet 3   oxyCODONE -acetaminophen  (PERCOCET) 5-325 MG tablet Take 1 tablet by mouth every 6 (six) hours as needed. 20 tablet 0   ranolazine  (RANEXA ) 500 MG 12 hr tablet Take 1 tablet (500 mg total) by mouth 2 (two) times daily. 180 tablet 2   rosuvastatin  (CRESTOR ) 40 MG tablet Take 1 tablet (40 mg total) by mouth daily. 90  tablet 3   No current facility-administered medications for this visit.     ROS:  See HPI  Physical Exam:  Today's Vitals   11/01/24 1301  BP: 108/75  Pulse: 91  Temp: 97.7 F (36.5 C)  TempSrc: Temporal  Weight: 170 lb 1.6 oz (77.2 kg)  PainSc: 0-No pain   Body mass index is 25.86 kg/m.   Incision:  right groin wound with good granulation tissue; left groin incision is well healed.   Extremities:  bilateral feet are warm     Assessment/Plan:  This is a 69 y.o. male who is s/p: endovascular repair of AAA using aorto uni iliac endograft, open bilateral femoral artery exposure, bilateral femoral endarterectomy, left femoral artery dacryon patch angioplasty, femorofemoral bypass,  and left common iliac and  external iliac artery intra-arterial lithotripsy and angioplasty by Dr. Serene on 07/07/2024.  This was done for treatment of a AAA with severe claudication of the right lower extremity.   He underwent sharp excisional debridement right groin and washout with kerecis fish skin and vac placement on 10/12/2024 by Dr. Sheree.   -wound vac removed and right groin wound has excellent granulation tissue.  We left the wound vac off.  Placed wet 2x2 dressing covered with 4x4 and tape.  Discussed with pt that tomorrow, he can remove dressing and shower with soap and water and then reapply dressing.  I did show he and his sister how to do wet to dry dressing and he was given supplies.   -he will f/u in 4 weeks - anticipate his would will be near healed.  He will call sooner if any issues.   -continue asa/eliquis /statin  Of note, he has non invasive studies scheduled for May.  Will add carotid duplex to evaluate his carotid subclavian bypass.  Lucie Apt, [facility] [phone]   Clinic MD:  Serene     [1] No Known Allergies  "

## 2024-11-02 DIAGNOSIS — I779 Disorder of arteries and arterioles, unspecified: Secondary | ICD-10-CM

## 2024-11-08 ENCOUNTER — Encounter: Payer: Self-pay | Admitting: Cardiology

## 2024-11-08 ENCOUNTER — Encounter: Payer: Self-pay | Admitting: Vascular Surgery

## 2024-11-08 NOTE — Telephone Encounter (Signed)
 Pt c/o medication issue:  1. Name of Medication: apixaban  (ELIQUIS ) 5 MG TABS tablet   clopidogrel  (PLAVIX ) 75 MG tablet    2. How are you currently taking this medication (dosage and times per day)? As written  3. Are you having a reaction (difficulty breathing--STAT)? No  4. What is your medication issue? Patient sister is asking for clarification wether the patient needs to be taking these meds or not, stated pharmacy said the medication has been stopped

## 2024-11-09 NOTE — Telephone Encounter (Signed)
 Spoke with patient's sister and she reports that she believes that patient has been taking Plavix , aspirin  325mg  and Eliquis . Sister is confused on what medication pt should be taking. Will send to Dr. Serene for clarification as well.

## 2024-11-09 NOTE — Telephone Encounter (Signed)
 I do not see any reason to discontinue Plavix , unless it was specifically recommended by vascular surgery after his recent surgery and postop wound issues.  I do not see anything in the notes that sees to discontinue Plavix .  Therefore, please continue Plavix  along with Eliquis .  Thanks MJP

## 2024-11-09 NOTE — Telephone Encounter (Signed)
 It appears that vascular surgery had recommended aspirin  and Eliquis .  In that case, he does not need Plavix .  But, he needs 1 antiplatelet and 1 anticoagulation medication.  He does not need to antiplatelet medications.  Therefore, I would discontinue Plavix  and continue aspirin  at 81 mg daily, unless specifically requested otherwise by vascular surgery.  We should also confirm this with Dr. Russ office.  Thanks MJP

## 2024-11-12 ENCOUNTER — Telehealth: Payer: Self-pay | Admitting: Cardiology

## 2024-11-12 MED ORDER — CLOPIDOGREL BISULFATE 75 MG PO TABS: 75.0000 mg | ORAL_TABLET | Freq: Every day | ORAL | 1 refills | Status: DC

## 2024-11-12 NOTE — Telephone Encounter (Signed)
Please review mychart message.

## 2024-11-12 NOTE — Telephone Encounter (Signed)
 Called vascular triage line to follow up on message. Left message to return call.

## 2024-11-12 NOTE — Telephone Encounter (Signed)
 [facility] calling to clarify blood thinner instructions. Please advise.

## 2024-11-12 NOTE — Telephone Encounter (Signed)
 Spoke to Manuel Richardson (sister on [place]) and she was advised the information from Dr. Elmira. She agrees with plan of care, but she advised that the patient has been out of Plavix  75 mg for 1 week. She called the patient and confirmed this information as well. Contacted Dr. Elmira and he verbally advised for patient to take aspirin  325 mg in place of Plavix  75 mg until Plavix  can be picked up. Refill sent in for Plavix  75 mg daily per verbal order Dr. Elmira. Manuel Richardson reports that prescription will be picked up tomorrow. She agrees with plan of care and will discuss this with the patient.

## 2024-11-12 NOTE — Telephone Encounter (Signed)
 I reviewed the chart again. It appears that he was discharged on Aspirin  325 mg daily, Plavix  75 mg daily, and Eliquis  5 mg bid, no change made during follow up visit. I think his bleeding risk is very high with continued triple therapy that includes high dose Aspirin . I recommend discontinuing Aspirin  and continuing Plavix  75 mg daily (for PAD) and Eliquis  5 mg bid (for Afib).  Thanks MJP

## 2024-11-15 ENCOUNTER — Other Ambulatory Visit (HOSPITAL_COMMUNITY): Payer: Self-pay

## 2024-11-19 ENCOUNTER — Encounter: Payer: Self-pay | Admitting: Cardiology

## 2024-11-19 ENCOUNTER — Ambulatory Visit: Attending: Cardiology | Admitting: Cardiology

## 2024-11-19 VITALS — BP 106/70 | HR 68 | Ht 68.0 in | Wt 169.0 lb

## 2024-11-19 DIAGNOSIS — I25118 Atherosclerotic heart disease of native coronary artery with other forms of angina pectoris: Secondary | ICD-10-CM

## 2024-11-19 DIAGNOSIS — I739 Peripheral vascular disease, unspecified: Secondary | ICD-10-CM | POA: Diagnosis not present

## 2024-11-19 DIAGNOSIS — I480 Paroxysmal atrial fibrillation: Secondary | ICD-10-CM

## 2024-11-19 DIAGNOSIS — E782 Mixed hyperlipidemia: Secondary | ICD-10-CM

## 2024-11-19 NOTE — Progress Notes (Unsigned)
 " Cardiology Office Note:  .   Date:  11/19/2024  ID:  Manuel Richardson, DOB 1956-05-29, MRN [id number] PCP: Valentin Skates, DO   [facility] Providers Cardiologist:  Newman Lawrence, MD PCP: Valentin Skates, DO  Chief Complaint  Patient presents with   Coronary artery disease of native artery of native heart wi   Follow-up    4 month      History of Present Illness: .    Manuel Richardson is a 69 y.o. 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   Patient had unsuccessful attempt at RCA CTO intervention in 01/2024. His exertional dyspnea has remained. He was undergoing pulmonary rehab, but was unable to complete due to his severe claudication symptoms in Rt leg. 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 He is seeing Dr. Serene for this.     Vitals:   11/19/24 1508  BP: 106/70  Pulse: 68  SpO2: 94%        ROS:  Review of Systems  Cardiovascular:  Positive for claudication and dyspnea on exertion. Negative for chest pain, leg swelling, palpitations and syncope.     Studies Reviewed: SABRA       EKG 01/05/2024: Normal sinus rhythm with sinus arrhythmia Cannot rule out Inferior infarct (cited on or before 30-Jul-2023) When compared with ECG of 10-Nov-2023 08:39, No significant change was found   Labs 01/2024: Chol 178, TG 237, HDL 49, LDL 89 Hb 12.2 Cr 9.01  07/2023: HbA1C 6.2%   Physical Exam:   Physical Exam Vitals and nursing note reviewed.  Constitutional:      General: He is not in acute distress. Neck:     Vascular: No JVD.  Cardiovascular:     Rate and Rhythm: Normal rate and regular rhythm.     Pulses: Decreased pulses.          Dorsalis pedis pulses are 0 on the right side and 1+ on the left side.       Posterior tibial pulses are 0 on the right side  and 0 on the left side.     Heart sounds: Normal heart sounds. No murmur heard. Pulmonary:     Effort: Pulmonary effort is normal.     Breath sounds: Normal breath sounds. No wheezing or rales.  Musculoskeletal:     Right lower leg: No edema.     Left lower leg: No edema.      VISIT DIAGNOSES: No diagnosis found.      ASSESSMENT AND PLAN: .    Manuel Richardson is a 69 y.o. 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   Exertional dyspnea, CAD: RCA territory ischemia on PET/CT stress test.  Unsuccessful revascularization attempt by Dr. Jordan 01/2024. Exertional dyspnea likely combination of CAD as well as COPD. Pulmonary rehab curtailed due to severe claudication symptoms, see below. He does not need dual antiplatelet therapy from cardiac standpoint.  Continue Plavix  monotherapy, discontinue aspirin  80 mg daily.  He may need DAPT for some period, should he undergo percutaneous intervention for his iliac artery.   From cardiac standpoint, will add Ranexa  500 mg twice daily. Off Plavix  since unsuccessful CTO PCI attempt.  Continue metoprolol  succinate 50  mg daily, amlodipine  5 mg daily, Ranexa  500 mg twice daily. Continue Crestor  40 mg daily. Check lipid panel. If LDL remains >70, he will need PCSK9 elevator or Leqvio.SABRA Re-emphasized importance of smoking cessation.  PAD: Prior left carotid-subclavian bypass. Occluded right iliac artery, moderate distal aorta stenosis. Severe claudication symptoms.   He is seeing Dr. Serene tomorrow for consultation. Continue medical management as above.  Hypertension: Controlled.  H/o Afib: Reported episode after inhalation of albuterol  in 2022. He had no symptoms then and remains unclear if he had any recurrence since then. He wore Zio patch in 2022 through [facility]  Cardiology that did not show Afib. He is absolutely not in favor ir restarting Eliquis  or any other anticoagulation.  Unless he has any recurrent A-fib, we would hold off initiating anticoagulation at this time.   F/u in 6 months   Signed, Newman JINNY Lawrence, MD  "

## 2024-11-19 NOTE — Patient Instructions (Signed)
 Medication Instructions:  STOP Aspirin  325 mg   *If you need a refill on your cardiac medications before your next appointment, please call your pharmacy*  Follow-Up: At [facility], you and your health needs are our priority.  As part of our continuing mission to provide you with exceptional heart care, our providers are all part of one team.  This team includes your primary Cardiologist (physician) and Advanced Practice Providers or APPs (Physician Assistants and Nurse Practitioners) who all work together to provide you with the care you need, when you need it.  Your next appointment:   6 month(s)  Provider:   Newman JINNY Lawrence, MD

## 2024-11-20 ENCOUNTER — Encounter: Payer: Self-pay | Admitting: Cardiology

## 2024-11-20 DIAGNOSIS — I480 Paroxysmal atrial fibrillation: Secondary | ICD-10-CM | POA: Insufficient documentation

## 2024-11-23 ENCOUNTER — Ambulatory Visit: Admitting: Pulmonary Disease

## 2024-11-23 ENCOUNTER — Encounter: Payer: Self-pay | Admitting: Pulmonary Disease

## 2024-11-23 VITALS — BP 106/72 | HR 84 | Temp 97.8°F | Ht 68.0 in | Wt 168.0 lb

## 2024-11-23 DIAGNOSIS — J449 Chronic obstructive pulmonary disease, unspecified: Secondary | ICD-10-CM

## 2024-11-23 DIAGNOSIS — R0602 Shortness of breath: Secondary | ICD-10-CM

## 2024-11-23 DIAGNOSIS — R0609 Other forms of dyspnea: Secondary | ICD-10-CM | POA: Diagnosis not present

## 2024-11-23 DIAGNOSIS — Z87891 Personal history of nicotine dependence: Secondary | ICD-10-CM | POA: Diagnosis not present

## 2024-11-23 DIAGNOSIS — I240 Acute coronary thrombosis not resulting in myocardial infarction: Secondary | ICD-10-CM

## 2024-11-23 DIAGNOSIS — I70213 Atherosclerosis of native arteries of extremities with intermittent claudication, bilateral legs: Secondary | ICD-10-CM

## 2024-11-23 DIAGNOSIS — G4736 Sleep related hypoventilation in conditions classified elsewhere: Secondary | ICD-10-CM

## 2024-11-23 MED ORDER — BREZTRI AEROSPHERE 160-9-4.8 MCG/ACT IN AERO: 2.0000 | INHALATION_SPRAY | Freq: Two times a day (BID) | RESPIRATORY_TRACT | 11 refills | Status: AC

## 2024-11-23 NOTE — Progress Notes (Signed)
 "  Subjective:    Patient ID: Manuel Richardson, male    DOB: 08/01/1956, 69 y.o.   MRN: [id number]  Patient Care Team: Valentin Skates, DO as PCP - General (Internal Medicine) Elmira Newman PARAS, MD as PCP - Cardiology (Cardiology) Serene Gaile ORN, MD as Consulting Physician (Vascular Surgery)  Chief Complaint  Patient presents with   COPD    Using Breztri  daily. Shortness of breath on exertion. Occasional wheezing.     BACKGROUND/INTERVAL:Patient is a 69 year old former smoker with over a 100-pack-year history of smoking and a history as noted below who presents for follow-up of dyspnea on exertion and fatigue.  Initially noted in May 2022 after COVID-19 infection.  Previously evaluated by Dr. Vicenta Lennert in August 2023 and no follow-up after that.  Patient has been on Breztri  and albuterol  for his dyspnea with only partial relief of symptoms.  Recently had left heart cath due to high risk cardiac PET/CT.  He has complete chronic occlusion of the RCA but it could not be intervened on due to noted to cross with a wire.  He is on medical therapy.  He continues to complain of severe dyspnea.  He was only able to complete 10 sessions of pulmonary rehab due to severe claudication.  He has had aorto uni iliac stent grafting and femoro-femoral bypass performed on 07 July 2024 by Dr. Gaile Serene.  He has been dealing with wound management issues from this procedure.  He was last seen here on 19 August 2024.  HPI Discussed the use of AI scribe software for clinical note transcription with the patient, who gave verbal consent to proceed.  History of Present Illness   Manuel Richardson is a 69 year old male with severe COPD who presents with shortness of breath.  He has severe COPD and experiences shortness of breath. He has run out of Breztri  and plans to purchase more. He was unaware of having refills available.  He does not use his nocturnal oxygen  nightly as recommended, stating he does  not notice a difference. His oxygen  levels drop to 76% during sleep.  Discussed the importance of using his oxygen  as prescribed.  He previously participated in pulmonary rehab but left early due to vascular surgery in September. Post-surgery, he experienced a fluid buildup on one side, which was addressed surgically to rule out infection. He used a wound vac for healing, which is progressing well. A caregiver visits his home twice a week to assist with wound care, and his roommate also helps with dressing changes.   He feels Breztri  helps with the shortness of breath however he continues to experience this symptom.  He does have moderate to severe COPD.   DATA 09/21/2018 spirometry: FEV1 1.70 L or 53% predicted, FVC 2.86 L or 66% predicted, FEV1/FVC 59%.  Consistent with moderate obstruction. 05/30/2022 spirometry: FEV1 1.49 L or 46% predicted, FVC 3.07 L or 70% predicted, FEV1/FVC 48%.  Consistent with moderate to severe obstruction. 10/09/2023 echocardiogram: LVEF 55 to 60%, normal LV function, mild left ventricular hypertrophy, grade 1 DD.  RV systolic function normal.  Revealed mitral valve regurgitation.  No mitral stenosis.  No aortic valve abnormalities. 11/05/2023 PFTs: FEV1 1.57 L or 48% predicted, FVC 3.23 L or 73% of predicted, FEV1/FVC 48%, no significant bronchodilator response, volumes show air trapping and mild hyperinflation, diffusion capacity is severely decreased.  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 01/01/2024 cardiac PET/CT: EF noted to be 33 to 36%.  Study  was high risk for ischemia. 01/16/2024 left heart cath: RCA with proximal 99% stenosis followed by chronic total occlusion.  Left-to-right collaterals from LAD septal branches feeding distal RCA and beyond.  No other significant stenosis noted.  PCI could not be done at that time. 01/29/2024 coronary CTO intervention: RCA to mid RCA lesion 100% stenosed.  Unsuccessful CTO PCI of the RCA.  Unable to cross with  a wire. 02/18/2024 alpha-1 antitrypsin: Phenotype MS, level 145 mg/dL.  Review of Systems A 10 point review of systems was performed and it is as noted above otherwise negative.   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 07/07/2024   AAA (abdominal aortic aneurysm) 07/07/2024   Angina pectoris 01/29/2024   Pre-op evaluation 01/05/2024   Coronary artery calcification 11/10/2023   Exertional dyspnea 11/04/2023   Atrial fibrillation with RVR (HCC) 03/28/2021   COVID-19 virus infection 03/28/2021   PAD (peripheral artery disease) 09/21/2018   Bilateral carotid bruits 09/21/2018   Tobacco use disorder 07/18/2014   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/26/2012   Coronary artery disease 05/26/2012   ED (erectile dysfunction) 03/30/2012   Primary hypertension 03/30/2012   Stage 3 severe COPD by GOLD classification (HCC) 03/30/2012   Annual physical exam 03/05/2011   Hyperlipidemia 03/05/2011   Carotid artery disease 03/05/2011    Social History   Tobacco Use   Smoking status: Former    Current packs/day: 0.00    Average packs/day: 3.0 packs/day for 54.0 years (162.0 ttl pk-yrs)    Types: Cigarettes    Start date: 34    Quit date: 2024    Years since quitting: 2.0   Smokeless tobacco: Never   Tobacco comments:    Quit 12/28/2022 khj 09/17/2023    Smoked for 53 years.     Smoked 3 PPD at his heaviest  Substance Use Topics   Alcohol use: Yes    Comment: occasionally    Allergies[1]  Active Medications[2]  Immunization History  Administered Date(s) Administered   Influenza,inj,Quad PF,6+ Mos 07/18/2014   PNEUMOCOCCAL CONJUGATE-20 03/28/2023   Pneumococcal Polysaccharide-23 03/05/2011, 03/28/2023   Tdap 03/05/2011, 06/04/2021        Objective:     Vitals:   11/23/24 1402 11/23/24  1403 11/23/24 1404 11/23/24 1451  BP:    106/72  Pulse: 84 Comment: lap 1 93 Comment: lap 2 99 Comment: lap 3 84  Temp:    97.8 F (36.6 C)  Height:    5' 8 (1.727 m)  Weight:    168 lb (76.2 kg)  SpO2: 97% 95% 95% 98%  TempSrc:    Temporal  BMI (Calculated):    25.55     SpO2: 95 %  GENERAL: Awake, alert, fully ambulatory.  No use of accessory use noted.  No conversational dyspnea. HEAD: Normocephalic, atraumatic.  EYES: Pupils equal, round, reactive to light.  No scleral icterus.  MOUTH: Few chipped teeth, oral mucosa moist.  Pharynx clear. NECK: Supple. No thyromegaly. Trachea midline. No JVD.  No adenopathy. PULMONARY: Distant breath sounds bilaterally.  Coarse, no adventitious sounds otherwise. CARDIOVASCULAR: S1 and S2. Regular rate and rhythm.  No rubs, murmurs or gallops heard. ABDOMEN: Mild truncal obesity. MUSCULOSKELETAL: No joint deformity, no clubbing, no edema.  NEUROLOGIC: No overt focal deficit, no gait disturbance, speech is fluent. SKIN: Intact,warm,dry. PSYCH: Mood and  behavior normal.  Ambulatory oxymetry was performed today:  At rest on room air oxygen  saturation was 98%, the patient ambulated at a normal pace, completed 3 laps, O2 nadir 95%, moderate shortness of breath.  Resting heart rate was 76 bpm at maximum for this exercise 99 bpm.       Assessment & Plan:     ICD-10-CM   1. Stage 3 severe COPD by GOLD classification (HCC)  J44.9     2. Dyspnea on exertion  R06.09     3. RCA occlusion (HCC)  I24.0     4. Nocturnal hypoxemia due to emphysema (HCC)  J43.9    G47.36     5. Atherosclerosis of native artery of both lower extremities with intermittent claudication  I70.213     6. Shortness of breath  R06.02       Meds ordered this encounter  Medications   BREZTRI  AEROSPHERE 160-9-4.8 MCG/ACT AERO inhaler    Sig: Inhale 2 puffs into the lungs 2 (two) times daily.    Dispense:  10.7 g    Refill:  11   Discussion:    Severe chronic  obstructive pulmonary disease Severe COPD with underlying dyspnea. Oxygen  saturation drops to 76% during sleep, increasing cardiac workload and exacerbating dyspnea. Non-compliance with nocturnal oxygen  therapy. Recent leg surgery with complications requiring wound care. Pulmonary rehabilitation interrupted due to surgery. - Provided sample of Breztri  for immediate use. - Encouraged use of nocturnal oxygen  therapy to maintain adequate oxygen  saturation. - Advised continuation of pulmonary rehabilitation sessions if available. - Scheduled follow-up appointment in three months.       Advised if symptoms do not improve or worsen, to please contact office for sooner follow up or seek emergency care.    I spent 35 minutes of dedicated to the care of this patient on the date of this encounter to include pre-visit review of records, face-to-face time with the patient discussing conditions above, post visit ordering of testing, clinical documentation with the electronic health record, making appropriate referrals as documented, and communicating necessary findings to members of the patients care team.     C. Leita Sanders, MD Advanced Bronchoscopy PCCM [facility]    *This note was generated using voice recognition software/Dragon and/or AI transcription program.  Despite best efforts to proofread, errors can occur which can change the meaning. Any transcriptional errors that result from this process are unintentional and may not be fully corrected at the time of dictation.    [1] No Known Allergies [2]  Current Meds  Medication Sig   albuterol  (VENTOLIN  HFA) 108 (90 Base) MCG/ACT inhaler Inhale 2 puffs into the lungs every 6 (six) hours as needed for wheezing or shortness of breath.   amLODipine  (NORVASC ) 5 MG tablet Take 1 tablet (5 mg total) by mouth daily.   apixaban  (ELIQUIS ) 5 MG TABS tablet Take 1 tablet (5 mg total) by mouth 2 (two) times daily.   aspirin  EC 325 MG  tablet Take 325 mg by mouth See admin instructions. 1-2 times daily   BREZTRI  AEROSPHERE 160-9-4.8 MCG/ACT AERO inhaler Inhale 2 puffs into the lungs 2 (two) times daily.   Calcium  Carbonate Antacid (TUMS PO) Take 1 tablet by mouth daily as needed (heartburn).   ezetimibe  (ZETIA ) 10 MG tablet Take 1 tablet (10 mg total) by mouth daily.   losartan  (COZAAR ) 50 MG tablet TAKE 1 TABLET BY MOUTH EVERY DAY   metoprolol  succinate (TOPROL -XL) 50 MG 24 hr tablet TAKE 1 TABLET BY  MOUTH DAILY. TAKE WITH OR IMMEDIATELY FOLLOWING A MEAL.   nitroGLYCERIN  (NITROSTAT ) 0.4 MG SL tablet Place 1 tablet (0.4 mg total) under the tongue every 5 (five) minutes as needed for chest pain.   oxyCODONE -acetaminophen  (PERCOCET) 5-325 MG tablet Take 1 tablet by mouth every 6 (six) hours as needed.   ranolazine  (RANEXA ) 500 MG 12 hr tablet Take 1 tablet (500 mg total) by mouth 2 (two) times daily.   rosuvastatin  (CRESTOR ) 40 MG tablet Take 1 tablet (40 mg total) by mouth daily.   "

## 2024-11-23 NOTE — Patient Instructions (Signed)
 VISIT SUMMARY:  During your visit, we discussed your severe COPD and the shortness of breath you have been experiencing. We also reviewed your recent leg surgery and the ongoing wound care you are receiving.  YOUR PLAN:  -SEVERE CHRONIC OBSTRUCTIVE PULMONARY DISEASE (COPD): COPD is a chronic lung condition that makes it hard to breathe. Your oxygen  levels drop significantly during sleep, which can strain your heart and worsen your breathing difficulties. You were given a sample of Breztri  to use immediately and encouraged to use your nocturnal oxygen  therapy to keep your oxygen  levels stable. Continuing with pulmonary rehabilitation sessions, if available, was also advised.  INSTRUCTIONS:  Please ensure you use the Breztri  as directed and continue with your nocturnal oxygen  therapy. Try to resume your pulmonary rehabilitation sessions if possible. We have scheduled a follow-up appointment in three months to monitor your progress.

## 2024-11-25 ENCOUNTER — Other Ambulatory Visit: Payer: Self-pay | Admitting: Cardiology

## 2024-11-29 ENCOUNTER — Ambulatory Visit

## 2025-02-22 ENCOUNTER — Ambulatory Visit: Admitting: Pulmonary Disease

## 2025-02-28 ENCOUNTER — Ambulatory Visit (HOSPITAL_COMMUNITY)

## 2025-02-28 ENCOUNTER — Ambulatory Visit
